# Patient Record
Sex: Female | Born: 1965 | Hispanic: No | Marital: Single | Smoking: Former smoker
Health system: Southern US, Community
[De-identification: ages and names within clinical notes are randomized; demographics above are authoritative.]

## PROBLEM LIST (undated history)

## (undated) DIAGNOSIS — M549 Dorsalgia, unspecified: Secondary | ICD-10-CM

## (undated) HISTORY — DX: Dorsalgia, unspecified: M54.9

---

## 2002-10-24 ENCOUNTER — Ambulatory Visit (HOSPITAL_COMMUNITY): Admission: RE | Admit: 2002-10-24 | Discharge: 2002-10-24 | Payer: Self-pay | Admitting: Internal Medicine

## 2002-10-24 ENCOUNTER — Encounter: Payer: Self-pay | Admitting: Internal Medicine

## 2002-12-15 ENCOUNTER — Ambulatory Visit (HOSPITAL_COMMUNITY): Admission: RE | Admit: 2002-12-15 | Discharge: 2002-12-15 | Payer: Self-pay | Admitting: Internal Medicine

## 2002-12-15 ENCOUNTER — Encounter: Payer: Self-pay | Admitting: Internal Medicine

## 2003-01-27 ENCOUNTER — Ambulatory Visit (HOSPITAL_COMMUNITY): Admission: RE | Admit: 2003-01-27 | Discharge: 2003-01-27 | Payer: Self-pay | Admitting: Internal Medicine

## 2003-01-27 ENCOUNTER — Encounter: Payer: Self-pay | Admitting: Internal Medicine

## 2003-12-24 ENCOUNTER — Ambulatory Visit (HOSPITAL_COMMUNITY): Admission: RE | Admit: 2003-12-24 | Discharge: 2003-12-24 | Payer: Self-pay | Admitting: Internal Medicine

## 2004-05-04 ENCOUNTER — Ambulatory Visit: Payer: Self-pay | Admitting: Internal Medicine

## 2004-05-06 ENCOUNTER — Ambulatory Visit: Payer: Self-pay | Admitting: *Deleted

## 2004-08-11 ENCOUNTER — Ambulatory Visit: Payer: Self-pay | Admitting: Internal Medicine

## 2004-10-14 ENCOUNTER — Ambulatory Visit: Payer: Self-pay | Admitting: Internal Medicine

## 2004-12-13 ENCOUNTER — Ambulatory Visit: Payer: Self-pay | Admitting: Internal Medicine

## 2004-12-13 ENCOUNTER — Encounter (INDEPENDENT_AMBULATORY_CARE_PROVIDER_SITE_OTHER): Payer: Self-pay | Admitting: Internal Medicine

## 2004-12-13 LAB — CONVERTED CEMR LAB: Pap Smear: NORMAL

## 2004-12-29 ENCOUNTER — Ambulatory Visit: Payer: Self-pay | Admitting: Internal Medicine

## 2004-12-30 ENCOUNTER — Ambulatory Visit: Payer: Self-pay | Admitting: Internal Medicine

## 2005-03-09 ENCOUNTER — Ambulatory Visit: Payer: Self-pay | Admitting: Internal Medicine

## 2005-03-23 ENCOUNTER — Ambulatory Visit: Payer: Self-pay | Admitting: Internal Medicine

## 2005-04-30 ENCOUNTER — Emergency Department (HOSPITAL_COMMUNITY): Admission: EM | Admit: 2005-04-30 | Discharge: 2005-04-30 | Payer: Self-pay | Admitting: Emergency Medicine

## 2005-05-08 ENCOUNTER — Ambulatory Visit: Payer: Self-pay | Admitting: Family Medicine

## 2005-07-10 ENCOUNTER — Ambulatory Visit: Payer: Self-pay | Admitting: Internal Medicine

## 2005-07-11 ENCOUNTER — Ambulatory Visit: Payer: Self-pay | Admitting: Internal Medicine

## 2005-09-21 ENCOUNTER — Ambulatory Visit: Payer: Self-pay | Admitting: Internal Medicine

## 2005-10-02 ENCOUNTER — Ambulatory Visit: Payer: Self-pay | Admitting: Internal Medicine

## 2005-10-16 ENCOUNTER — Ambulatory Visit: Payer: Self-pay | Admitting: Internal Medicine

## 2005-12-12 ENCOUNTER — Ambulatory Visit: Payer: Self-pay | Admitting: Internal Medicine

## 2005-12-22 ENCOUNTER — Inpatient Hospital Stay (HOSPITAL_COMMUNITY): Admission: AD | Admit: 2005-12-22 | Discharge: 2005-12-22 | Payer: Self-pay

## 2006-01-03 ENCOUNTER — Other Ambulatory Visit: Admission: RE | Admit: 2006-01-03 | Discharge: 2006-01-03 | Payer: Self-pay | Admitting: Obstetrics & Gynecology

## 2006-01-03 ENCOUNTER — Ambulatory Visit: Payer: Self-pay | Admitting: Obstetrics & Gynecology

## 2006-01-03 ENCOUNTER — Encounter (INDEPENDENT_AMBULATORY_CARE_PROVIDER_SITE_OTHER): Payer: Self-pay | Admitting: *Deleted

## 2006-01-17 ENCOUNTER — Ambulatory Visit: Payer: Self-pay | Admitting: Obstetrics & Gynecology

## 2006-01-17 ENCOUNTER — Encounter: Payer: Self-pay | Admitting: Obstetrics & Gynecology

## 2006-03-14 ENCOUNTER — Ambulatory Visit: Payer: Self-pay | Admitting: Obstetrics & Gynecology

## 2006-06-06 ENCOUNTER — Ambulatory Visit: Payer: Self-pay | Admitting: Obstetrics & Gynecology

## 2006-07-05 ENCOUNTER — Ambulatory Visit: Payer: Self-pay | Admitting: Obstetrics and Gynecology

## 2006-10-04 IMAGING — US US PELVIS COMPLETE MODIFY
1 series · 13 of 25 positions shown · non-contrast
Comparison: none

CLINICAL DATA: Heavy dysfunctional uterine bleeding.  Negative pregnancy test.
TRANSABDOMINAL AND TRANSVAGINAL PELVIC ULTRASOUND:
TECHNIQUE: Both transabdominal and transvaginal ultrasound examinations of the pelvis were performed including evaluation of the uterus, ovaries, adnexal regions, and pelvic cul-de-sac.

[Series 1: us pelvis complete modify · 0.27mm/px · 13 of 74 slices shown]
[im 1/74]
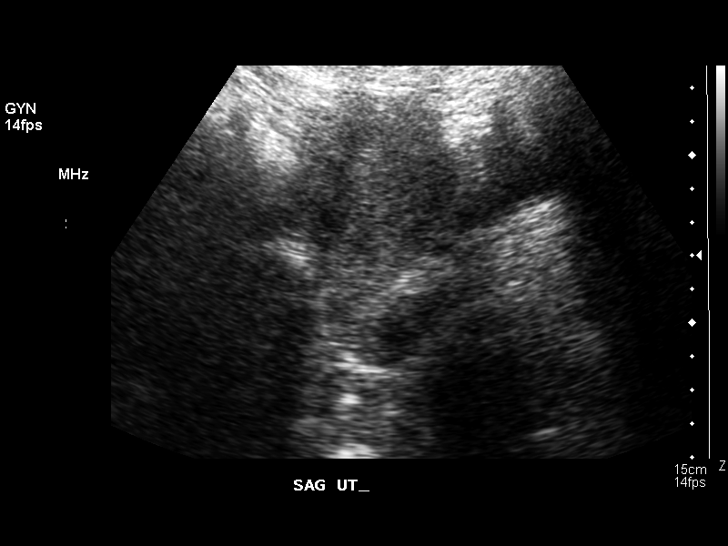
[im 7/74]
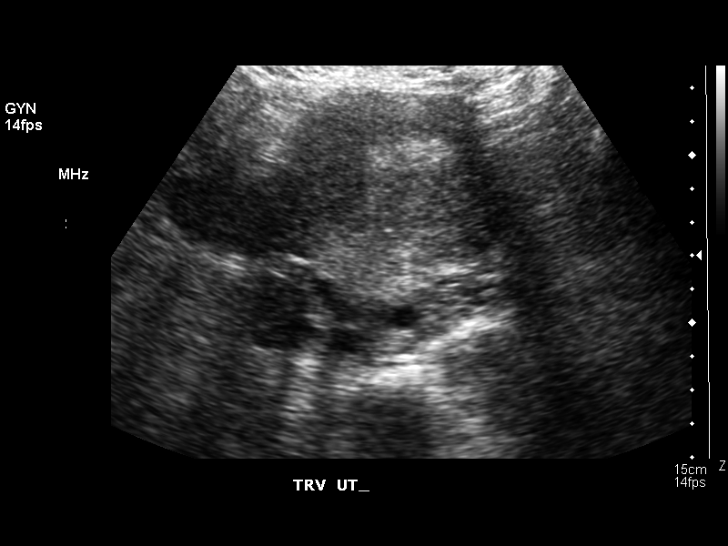
[im 13/74]
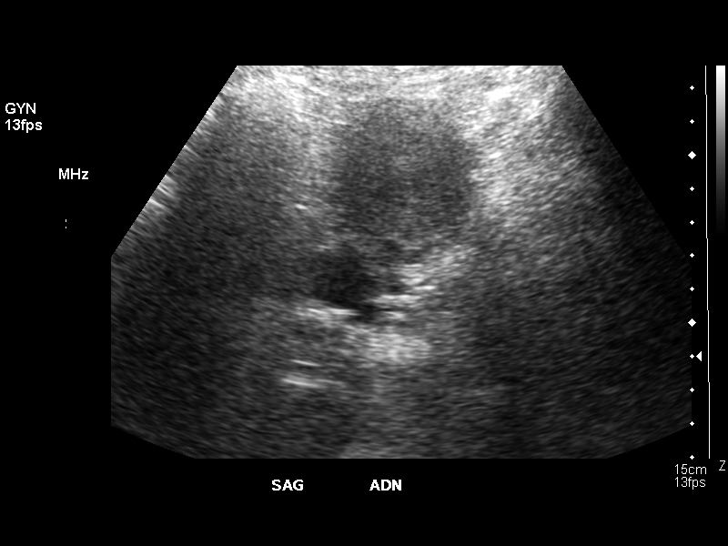
[im 19/74]
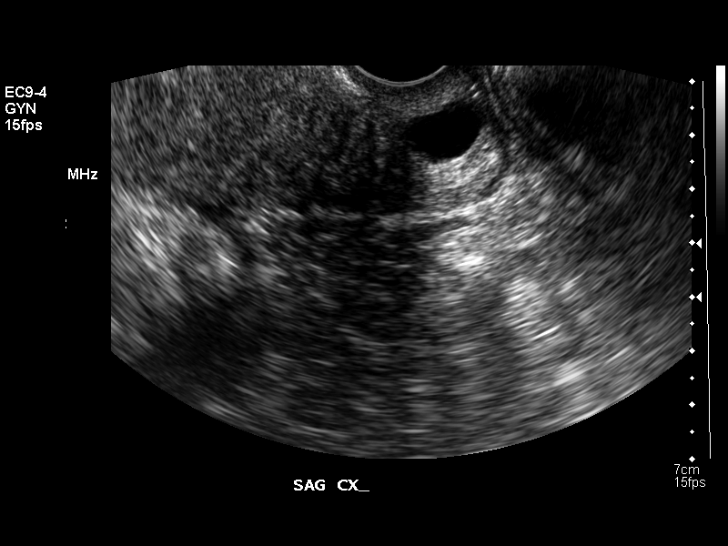
[im 25/74]
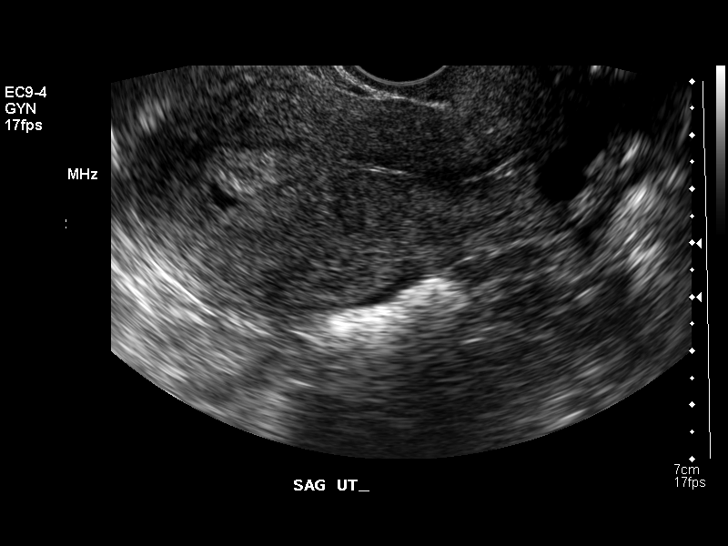
[im 31/74]
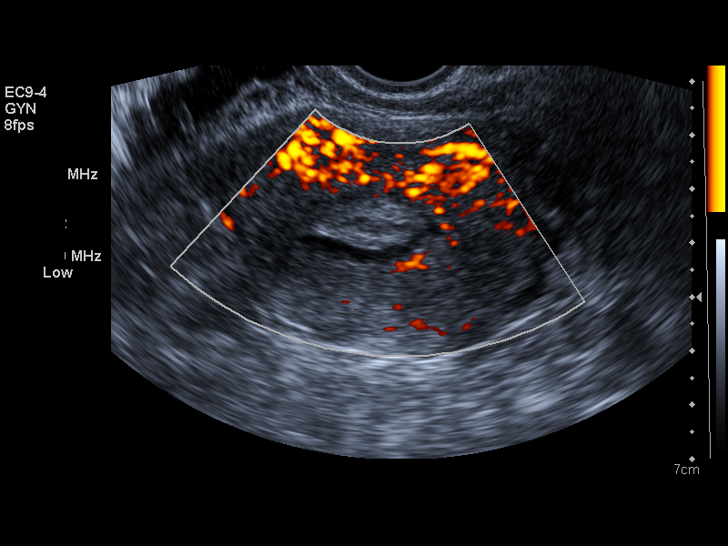
[im 37/74]
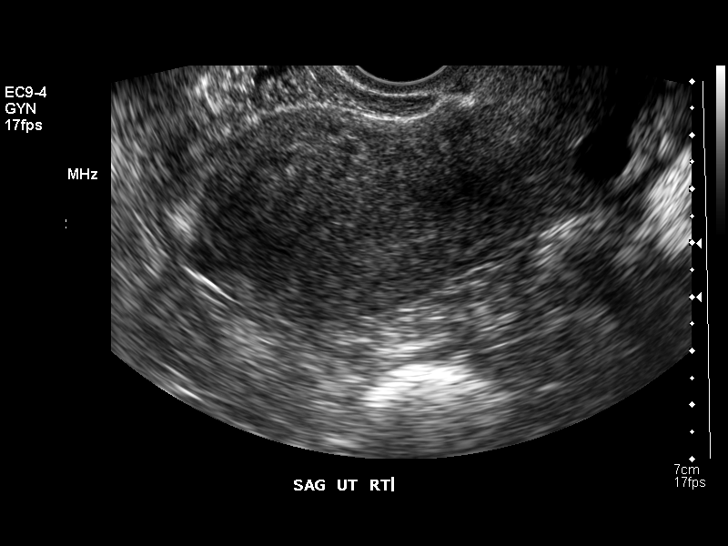
[im 43/74]
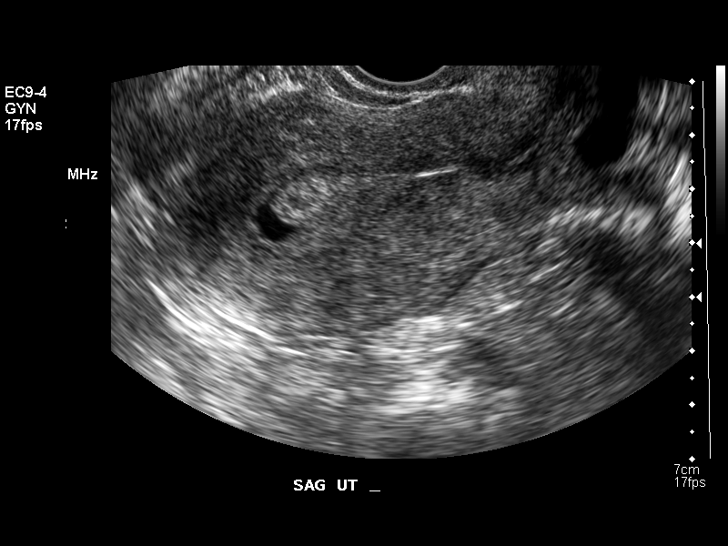
[im 49/74]
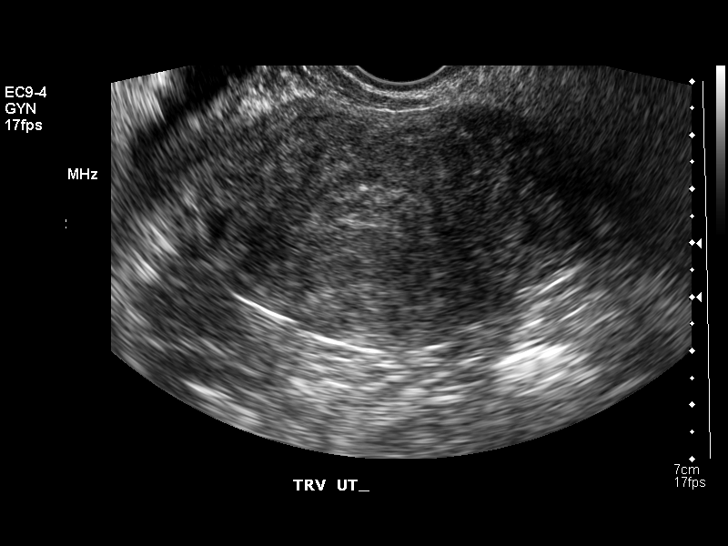
[im 55/74]
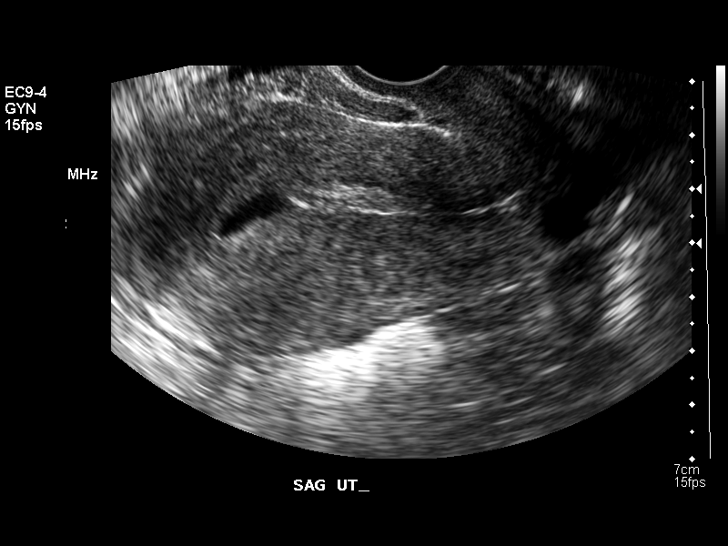
[im 61/74]
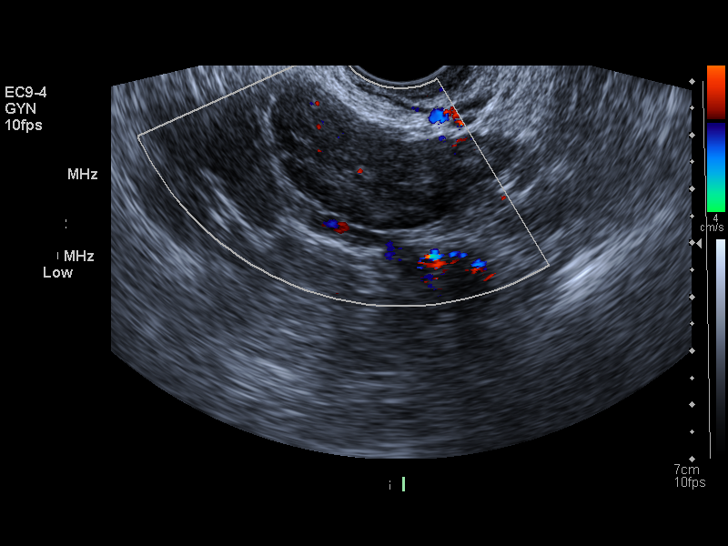
[im 67/74]
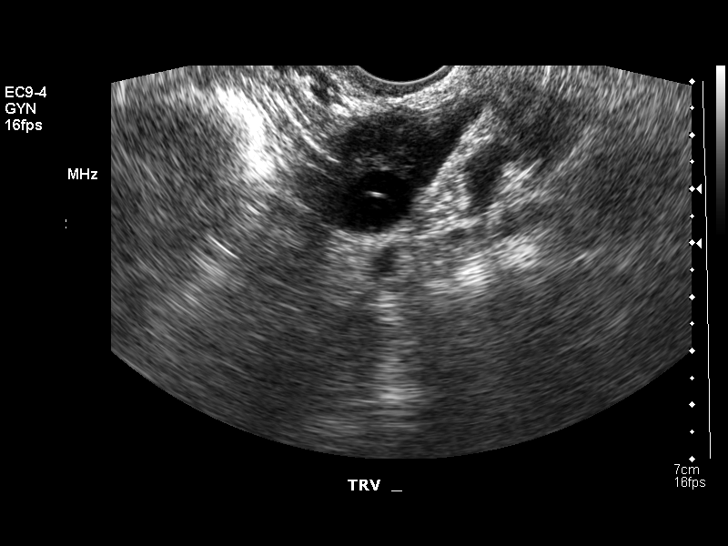
[im 74/74]
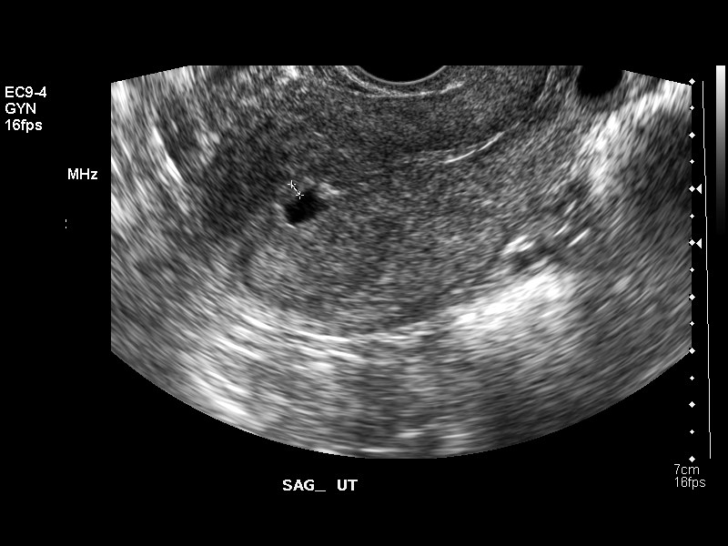

[13 of 25 positions shown; findings below may reference images not displayed]

FINDINGS: The uterus is normal in size measuring approximately 8.5 cm in length.  No fibroids or other uterine masses are identified.  Several cervical nabothian cysts are noted.  
There is a tiny amount of fluid in the fundal portion of the endometrial cavity.  There is also a focal area of thickening of the endometrium noted in the upper uterine body, with this area measuring approximately 7 mm in thickness.  No internal blood flow is seen within this area of endometrial thickening however, the possibility of an endometrial polyp cannot be excluded.  
Both ovaries are visualized and are normal in appearance.  No adnexal masses or free fluid are identified by transabdominal or transvaginal sonography.
IMPRESSION: 1.  No evidence of fibroids.
2.  Small amount of fluid noted within the endometrial cavity and a focal area of endometrial thickening in the upper uterine body measuring approximately 7 mm.  Endometrial pathology such as a polyp cannot be excluded, and further evaluation with sonohysterogram could be performed if clinically warranted.  
3.  Normal ovaries.  No evidence of adnexal mass or free fluid.

## 2007-01-28 ENCOUNTER — Encounter (INDEPENDENT_AMBULATORY_CARE_PROVIDER_SITE_OTHER): Payer: Self-pay | Admitting: Internal Medicine

## 2007-01-28 DIAGNOSIS — N898 Other specified noninflammatory disorders of vagina: Secondary | ICD-10-CM | POA: Insufficient documentation

## 2007-01-28 DIAGNOSIS — E119 Type 2 diabetes mellitus without complications: Secondary | ICD-10-CM

## 2007-05-15 ENCOUNTER — Ambulatory Visit: Payer: Self-pay | Admitting: Obstetrics & Gynecology

## 2007-05-15 ENCOUNTER — Encounter: Payer: Self-pay | Admitting: Obstetrics & Gynecology

## 2008-04-29 ENCOUNTER — Encounter: Payer: Self-pay | Admitting: Obstetrics and Gynecology

## 2008-04-29 ENCOUNTER — Ambulatory Visit: Payer: Self-pay | Admitting: Obstetrics and Gynecology

## 2009-06-09 ENCOUNTER — Ambulatory Visit: Payer: Self-pay | Admitting: Obstetrics and Gynecology

## 2009-06-09 ENCOUNTER — Encounter: Payer: Self-pay | Admitting: Obstetrics and Gynecology

## 2011-01-03 NOTE — Group Therapy Note (Signed)
NAME:  ROZALYN, OSLAND                 ACCOUNT NO.:  [id number]   MEDICAL RECORD NO.:  [id number]          PATIENT TYPE:  WOC   LOCATION[facility]                   FACILITY:  [facility]   PHYSICIAN:  Argentina Donovan, MD        DATE OF BIRTH:  1966-07-05   DATE OF SERVICE:                                  CLINIC NOTE   The patient is a 45 year old Caucasian female, gravida 3, para 3-0-0-3,  in for an annual visit.  She has no significant complaints except for  her weight, and she did come down from over almost 300 pounds to 238 on  her own, but that has been plateauing for the past year.  We discussed  that and the possibility of getting her into some good program.  The  impression that I have in talking with her is that she has no other  complaints.  We have offered her a mammogram.  There is a scholarship  fund, and she does not want that now, but she said she would call back  if she decided she did want it.   PHYSICAL EXAMINATION:  BREASTS:  On examination, her breasts are  pendulous and with no dominant masses.  No nipple discharge.  ABDOMEN:  Soft, flat and nontender.  No masses or organomegaly.  GENITALIA:  External was normal.  Introitus was marital.  BUS within  normal limits.  Vagina was clean and well rugated.  Cervix was clean and  parous.  Uterus anterior, normal size, shape, consistency.  The adnexa  could not be outlined because of the habitus of the patient.  Rectal  exam was confirmatory with no mass noted.   IMPRESSION:  Normal gynecological examination.  Pap smear was taken and  wet prep.           ______________________________  Argentina Donovan, MD     PR/MEDQ  D:  04/29/2008  T:  04/29/2008  Job:  [id number]

## 2011-01-03 NOTE — Group Therapy Note (Signed)
NAME:  Pamela Shannon, Pamela Shannon                 ACCOUNT NO.:  [id number]   MEDICAL RECORD NO.:  [id number]          PATIENT TYPE:  WOC   [facility]ATION[facility]                   FACILITY:  [facility]   PHYSICIAN:  Johnella Moloney, MD        DATE OF BIRTH:  19-Oct-1965   DATE OF SERVICE:                                  CLINIC NOTE   CHIEF COMPLAINT:  Yearly exam, vulva pruritus.   HISTORY OF PRESENT ILLNESS:  The patient is a 45 year old G3 P3. Her  last menstrual period was April 24, 2007. She is here for her yearly  exam. The patient also reports having itching and burning on her vulva  area accompanied by vaginal discharge with occasional bad odor, and this  has been going on for two and a half weeks. She reports using Monistat  which did not alleviate her symptoms. She denies any fevers, chills,  sweats, or any other symptoms. The patient is currently sexually active  with one female partner and has been in a relationship with him for the  last four months. There are no other issues. The patient had a Mirena  IUD that was placed in October 2007 for dysfunctional uterine bleeding  and reports being very satisfied with that method.   PAST OB-GYN HISTORY:  G3 P3, spontaneous vaginal delivery x3. The  patient with dysfunctional uterine bleeding prior to Mirena placement.  After Mirena placement, the patient reports having episodes of bleeding  every 1 to 2 months which last 3 to 5 days and she uses panty liners or  a few pads. She is currently sexually active. No condoms.   PAST MEDICAL HISTORY:  None.   PAST SURGICAL HISTORY:  None.   MEDICATIONS:  1. Green tea.  2. Multivitamins.  3. Fish oil caplets.   ALLERGIES:  No known drug allergies.   SOCIAL HISTORY:  The patient works as a Forensic psychologist. She lives with  children. She denies any domestic abuse. She denies any tobacco,  alcohol, or illicit drug use.   FAMILY HISTORY:  Maternal aunt with a diagnosis of breast cancer in her  late 37s.  She denies any ovarian, uterine, or any other cancer.   REVIEW OF SYSTEMS:  A 14 point review of systems is reviewed which was  negative.   PHYSICAL EXAMINATION:  VITAL SIGNS:  Temperature 98.9, pulse 97, blood  pressure 120/76, weight 230 pounds, height 5 feet 9 inches.  GENERAL:  No apparent distress.  BREASTS:  Bilaterally symmetrical, nontender, no abnormal masses  palpated, no abnormal skin changes.  ABDOMEN:  Soft, obese, nontender, nondistended.  PELVIC:  Diffuse erythema and some excoriations noted on bilateral labia  minora, normal labia majora, no abnormal discharge or other lesions  noted externally. On speculum examination, well rugate vagina, but not  lesions. Small amount of clear discharge noted. No odor. Samples  obtained for wet prep. Cervix is parous. No abnormal discharge. IUD  strings recognized about 2 centimeters in length from external os. Pap  smear sample obtained. By manual exam, the patient has normal sized  retroverted uterus, nontender, no abnormal adnexal  masses.   ASSESSMENT AND PLAN:  The patient is a 45 year old G3 P3 here for her  annual exam. The patient also reports a history of vulva irritation.  Given her symptoms, the patient was given a prescription of  hydrocortisone 1% cream to be used and applied topically to the area  twice daily as needed for irritation and in the meantime will follow up  wet prep results and treat accordingly. We will also follow up pap smear  results. The pap smear sample was also sent for testing for gonorrhea  and chlamydia. I emphasized the importance of using condoms for sexually  transmitted infection and prevention. The patient was also counseled  regarding having a mammogram given her age and family history, but the  patient is unwilling to be scheduled for a mammogram at this point due  to financial reasons. She was informed of a program that gives aid to  women who do not have insurance in order for them to get a  mammogram,  but the patient is not currently interested in this. She was told that  it was the standard of care to start mammograms in women who are aged 20  or above, especially given the history of breast cancer. The patient  verbalized understanding of all of this and said that she,  is not  ready for a mammogram right now. We will follow up pap smear results,  gonorrhea/chlamydia results, wet prep results, and the patient will be  contacted with the results of these tests.           ______________________________  Johnella Moloney, MD     UD/MEDQ  D:  05/15/2007  T:  05/16/2007  Job:  [id number]

## 2011-01-06 NOTE — Group Therapy Note (Signed)
NAME:  Pamela Shannon, Pamela Shannon                 ACCOUNT NO.:  [id number]   MEDICAL RECORD NO.:  [id number]          PATIENT TYPE:  WOC   [facility]ATION[facility]                   [facility]ACILITY:  [facility]   PHYSICIAN:  Elsie Lincoln, MD      DATE OF BIRTH:  [birth year]   DATE OF SERVICE:  01/03/2006                                    CLINIC NOTE   Patient is a 45 year old female who was sent to me from [facility] for  chronic bleeding.  She was also seen at the [facility] for the same problem.  She  has had six months of bleeding every day.  She has been on Depo Provera and  has still had bleeding every day.  On May 4 she came to the [facility], was found  to have a hemoglobin of 10.  Pelvic ultrasound showed a small amount of free  fluid in endometrial canal and also a thickening in the upper uterine body  approximately 7 mm.  She did have GC/Chlamydia done at that time.  Her EPT  was negative.  She was started on Ovcon three tablets in three days, two  days for three days, then daily.  Patient is to not take the placebo pills  of this pack.  Today the patient states she is just spotting.  Up until this  past six months she has had only normal periods every month and no other  menstrual irregularities that she can comment on.  No intramenstrual  spotting.   PAST MEDICAL HISTORY:  Denies all medical problems.   PAST SURGICAL HISTORY:  Denies all surgeries.   PAST GYN HISTORY:  NSVD x3 and a history of a abnormal Pap smear in 2003  that required a biopsy.  This was done at [facility] in [facility].  She  denies ovarian cysts or fibroid tumors.   REVIEW OF SYMPTOMS:  Today fatigue, weight loss, dizzy spells, problems with  vision, nausea, vomiting, and vaginal bleeding.   FAMILY HISTORY:  Positive for mother, father and diabetes on herself.  Heart  attack with her grandfather.  High blood pressure in her mother and  grandmother.  Her aunt had a cancer but she does not elucidate which kind.   SOCIAL HISTORY:  She  does not smoke or drink alcohol or do drugs.  Has  approximately one caffeinated beverage a day.   MEDICATIONS:  Vitamin B12, vitamin E, vitamin C, calcium, green tea, Depo  Provera, OCPs, and was in iron until she became constipated.  She is going  to restart the iron and docusate sodium after she takes a laxative this  weekend.   ALLERGIES:  No food or drug allergies.   Last Pap smear was at least a year ago and she is in need of another one.  Last mammogram is none.   PHYSICAL EXAMINATION:  ABDOMEN:  Soft, nontender, nondistended.  No rebound.  No guarding.  GENITALIA:  Tanner V.  Vulva:  No lesions.  Vagina:  Some blood.  Uterus:  Grade 1-2 prolapse.  Sounds to 8 cm.  Mobile.  Adnexa:  No masses,  nontender.  Perineum:  Positive hemorrhoids.   ASSESSMENT/PLAN:  A 45 year old female with abnormal uterine bleeding.  1.  EPT negative and endometrial biopsy done with three passes.  Lots of old      blood in the [place].  2.  Docusate sodium and iron to continue.  3.  Consider TSH next visit.  4.  Need to rule out polyp or fibroid.  This can either be done with saline      sono or go just directly to [facility] hysteroscopy and possible ablation.  5.  Patient is a diabetic.  Now that I have looked at some of her paperwork      we need to elucidate more of this at her next history.  6.  Pap smear needs to be done.  7.  Mammogram needs to be done.           ______________________________  Elsie Lincoln, MD     KL/MEDQ  D:  01/03/2006  T:  01/04/2006  Job:  [id number]

## 2011-01-06 NOTE — Group Therapy Note (Signed)
NAME:  Pamela Shannon, Pamela Shannon                 ACCOUNT NO.:  [id number]   MEDICAL RECORD NO.:  [id number]          PATIENT TYPE:  WOC   [facility]                   [facility]ACILITY[facility]   PHYSICIAN:  Argentina Donovan, MD        DATE OF BIRTH:  06/08/1966   DATE OF SERVICE:  07/05/2006                                    CLINIC NOTE   REASON FOR VISIT:  The patient is a 45 year old, African-American female who  had the [place] IUD placed 1 month ago to control irregular bleeding that had  very recently been controlled by birth control pills.  She had a negative  endometrial biopsy prior to that and her chief complaint is that she is  still spotting.  I told her to try and have patience and that probably the  bleeding would stop in a short time.  She says that interferes with her  ability to have a relationship and so I have added Loestrin FE for two  cycles and hopefully the Jearld Adjutant will be effective by that time.  She was  also treated for a urinary tract infection when she was last in and will  follow that up with her repeat urine.   IMPRESSION:  Dysfunctional uterine bleeding.           ______________________________  Argentina Donovan, MD     PR/MEDQ  D:  07/05/2006  T:  07/05/2006  Job:  [id number]

## 2011-01-06 NOTE — Group Therapy Note (Signed)
NAME:  Pamela Shannon, Pamela Shannon                 ACCOUNT NO.:  [id number]   MEDICAL RECORD NO.:  [id number]          PATIENT TYPE:  WOC   [facility]                   [facility]   PHYSICIAN:  Elsie Lincoln, MD      DATE OF BIRTH:  06-13-1966   DATE OF SERVICE:  01/17/2006                                    CLINIC NOTE   The patient presents for results.  The patient has DUB for 6 months.  She  also needed a Pap smear.  The patient had negative endometrial biopsy.  It  was predominantly blood; however, she did have some rare fragments of  endometrial tissue consistent with exogenous progesterone therapy.  Endometrial cancer is very unlikely. The patient is on Loestrin Fe 24 and is  doing very well, is doing much better on this than on the Depot.  She wants  continue this and eventually get the [facility].  We did Pap smear cultures  today in order to facilitate the placement of the [facility].  She is applying  for a free Jearld Adjutant to the [employer].  We are also going to check her  thyroid today and make sure this is in normal limits.  The patient is also  encouraged to get a mammogram which she refuses at this time.  We will  readdress this at a later visit.  The patient is to come back in 2 months  for [facility] IUD insertion.           ______________________________  Elsie Lincoln, MD     KL/MEDQ  D:  01/17/2006  T:  01/17/2006  Job:  [id number]

## 2011-01-06 NOTE — Group Therapy Note (Signed)
NAME:  Pamela Shannon, Pamela Shannon                 ACCOUNT NO.:  [id number]   MEDICAL RECORD NO.:  [id number]          PATIENT TYPE:  WOC   [facility]ATION[facility]                   [facility]   PHYSICIAN:  Dorthula Perfect, MD     DATE OF BIRTH:  04/19/1966   DATE OF SERVICE:  06/06/2006                                    CLINIC NOTE   [age]-year-old female, para 3, with a history of dysfunctional uterine  bleeding for some time now, is here for a Mirena IUD.  She has had a  negative endometrial biopsy that was done May 2007.  She has a negative  pregnancy test today.   The procedure was explained to the patient.  Consent forms are signed.   PHYSICAL EXAMINATION:  ABDOMEN:  Somewhat obese, soft and nontender.  PELVIC EXAMINATION:  External genitalia and BUS glands are normal.  The  vaginal wall is epithelialized, as was the cervix.  The uterus is in the  midline, and is of normal size and shape to perhaps slightly minimally  enlarged.  Adnexal areas are normal.   IMPRESSION:  History of symptomatic menorrhagia.   DISPOSITION:  1. Mirena IUD was inserted.  The endocervical-endometrial cavity was      sounded a little bit more than 10 cm, the intrauterine device was      inserted without difficulty.  The string was trimmed to about 1.5      inches.  The patient will return in 4 weeks to see how things are going      and to check the string.  If all is okay, then she can be seen      annually.           ______________________________  Dorthula Perfect, MD     ER/MEDQ  D:  06/06/2006  T:  06/08/2006  Job:  [id number]

## 2011-07-07 ENCOUNTER — Ambulatory Visit (INDEPENDENT_AMBULATORY_CARE_PROVIDER_SITE_OTHER): Payer: Self-pay | Admitting: Advanced Practice Midwife

## 2011-07-07 ENCOUNTER — Encounter: Payer: Self-pay | Admitting: *Deleted

## 2011-07-07 VITALS — BP 125/82 | HR 110 | Temp 97.8°F | Ht 68.0 in | Wt 224.1 lb

## 2011-07-07 DIAGNOSIS — Z30433 Encounter for removal and reinsertion of intrauterine contraceptive device: Secondary | ICD-10-CM

## 2011-07-07 DIAGNOSIS — Z30430 Encounter for insertion of intrauterine contraceptive device: Secondary | ICD-10-CM

## 2011-07-07 DIAGNOSIS — Z0000 Encounter for general adult medical examination without abnormal findings: Secondary | ICD-10-CM

## 2011-07-07 DIAGNOSIS — N390 Urinary tract infection, site not specified: Secondary | ICD-10-CM

## 2011-07-07 DIAGNOSIS — Z01419 Encounter for gynecological examination (general) (routine) without abnormal findings: Secondary | ICD-10-CM

## 2011-07-07 DIAGNOSIS — N3090 Cystitis, unspecified without hematuria: Secondary | ICD-10-CM

## 2011-07-07 DIAGNOSIS — Z0001 Encounter for general adult medical examination with abnormal findings: Secondary | ICD-10-CM | POA: Insufficient documentation

## 2011-07-07 LAB — POCT URINALYSIS DIP (DEVICE)
Glucose, UA: 250 mg/dL — AB
Ketones, ur: NEGATIVE mg/dL
Protein, ur: NEGATIVE mg/dL
Specific Gravity, Urine: 1.025 (ref 1.005–1.030)
Urobilinogen, UA: 0.2 mg/dL (ref 0.0–1.0)

## 2011-07-07 LAB — POCT PREGNANCY, URINE: Preg Test, Ur: NEGATIVE

## 2011-07-07 MED ORDER — SULFAMETHOXAZOLE-TRIMETHOPRIM 800-160 MG PO TABS: 1.0000 | ORAL_TABLET | Freq: Two times a day (BID) | ORAL | Status: AC

## 2011-07-07 NOTE — Progress Notes (Signed)
IUD Insertion Procedure Note  Pre-operative Diagnosis: Desires IUD replacement (has Mirena IUD, Wants new one)  Post-operative Diagnosis: same  Indications: contraception  Procedure Details  Urine pregnancy test was done  and result was Negative.  The risks (including infection, bleeding, pain, and uterine perforation) and benefits of the procedure were explained to the patient and Written informed consent was obtained.    Time Out was performed.  Cervix cleansed with Betadine. Uterus sounded to 6 cm. IUD inserted without difficulty. String visible and trimmed. Patient tolerated procedure well.  IUD Information: Mirena. Lot # [id number] [id number] 05/15  Condition: Stable  Complications: None  Plan:  The patient was advised to call for any fever or for prolonged or severe pain or bleeding. She was advised to use NSAID as needed for mild to moderate pain.   Attending Physician Documentation: I was present for or participated in the entire procedure, including opening and closing. (Dr Macon Large was in clinic, but I performed procedure)   Additional Diagnosis:  Cystitis (positive Nitrites)  Will treat with Septra DS for 3 days  Pap smear also done for annual exam.

## 2011-07-07 NOTE — Patient Instructions (Signed)

## 2011-07-08 LAB — WET PREP, GENITAL
Trich, Wet Prep: NONE SEEN
WBC, Wet Prep HPF POC: NONE SEEN

## 2013-02-04 ENCOUNTER — Other Ambulatory Visit (HOSPITAL_COMMUNITY): Payer: Self-pay | Admitting: Internal Medicine

## 2013-02-04 DIAGNOSIS — R0602 Shortness of breath: Secondary | ICD-10-CM

## 2013-02-11 ENCOUNTER — Encounter (HOSPITAL_COMMUNITY): Payer: Self-pay

## 2014-03-29 ENCOUNTER — Ambulatory Visit (INDEPENDENT_AMBULATORY_CARE_PROVIDER_SITE_OTHER): Payer: BC Managed Care – PPO | Admitting: Emergency Medicine

## 2014-03-29 VITALS — BP 138/84 | HR 96 | Temp 98.1°F | Resp 18 | Ht 68.0 in | Wt 238.4 lb

## 2014-03-29 DIAGNOSIS — M7501 Adhesive capsulitis of right shoulder: Secondary | ICD-10-CM

## 2014-03-29 DIAGNOSIS — M7500 Adhesive capsulitis of unspecified shoulder: Secondary | ICD-10-CM

## 2014-03-29 MED ORDER — NAPROXEN SODIUM 550 MG PO TABS: 550.0000 mg | ORAL_TABLET | Freq: Two times a day (BID) | ORAL | Status: AC

## 2014-03-29 NOTE — Progress Notes (Signed)
[facility] [address], [place] [address] [phone]  Date:  03/29/2014   Name:  Pamela Shannon   DOB:  10/23/1965   MRN:  [id number]  PCP:  No PCP Per Patient    Chief Complaint: Shoulder Pain   History of Present Illness:  Pamela Shannon is a 48 y.o. 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  Has pain in right shoulder with no history of injury or overuse.   Awoke with pain and unable to use arm.  No neuro symptoms.   No improvement with over the counter medications or other home remedies.  Denies other complaint or health concern today.   Patient Active Problem List   Diagnosis Date Noted  . Encounter for IUD insertion 07/07/2011  . DM, UNCOMPLICATED, TYPE II 01/28/2007  . VAGINAL BLEEDING 01/28/2007    Past Medical History  Diagnosis Date  . Diabetes mellitus   . Back pain     History reviewed. No pertinent past surgical history.  History  Substance Use Topics  . Smoking status: Former Games developermoker  . Smokeless tobacco: Never Used  . Alcohol Use: Yes    Family History  Problem Relation Age of Onset  . Diabetes Father   . Diabetes Mother   . Fibromyalgia Mother     No Known Allergies  Medication list has been reviewed and updated.  Current Outpatient Prescriptions on File Prior to Visit  Medication Sig Dispense Refill  . levonorgestrel (MIRENA) 20 MCG/24HR IUD 1 each by Intrauterine route once.        . metFORMIN (GLUCOPHAGE) 500 MG tablet Take 1,000 mg by mouth once.       . Naproxen Sodium (ALEVE) 220 MG CAPS Take 1 capsule by mouth as needed.         No current facility-administered medications on file prior to visit.    Review of Systems:   As per HPI, otherwise negative.    Physical Examination: Filed Vitals:   03/29/14 1417  BP: 138/84  Pulse: 96  Temp: 98.1 F (36.7 C)  Resp: 18   Filed Vitals:   03/29/14 1417  Height: 5\' 8"  (1.727 m)  Weight: 238 lb  6.4 oz (108.138 kg)   Body mass index is 36.26 kg/(m^2). Ideal Body Weight: Weight in (lb) to have BMI = 25: 164.1   GEN: WDWN, NAD, Non-toxic, Alert & Oriented x 3 HEENT: Atraumatic, Normocephalic.  Ears and Nose: No external deformity. EXTR: No clubbing/cyanosis/edema NEURO: Normal gait.  PSYCH: Normally interactive. Conversant. Not depressed or anxious appearing.  Calm demeanor.  Right shoulder:  Tender anterior shoulder.  Limited elevation and abduction.  Cannot bring above plane of floor.  Assessment and Plan: Adhesive capsulitis Depo 40 and marcaine Anaprox Ortho  Signed,  Phillips OdorJeffery Anderson, MD

## 2014-03-29 NOTE — Patient Instructions (Signed)
Adhesive Capsulitis  Sometimes the shoulder becomes stiff and is painful to move. Some people say it feels as if the shoulder is frozen in place. Because of this, the condition is called "frozen shoulder." Its medical name is adhesive capsulitis.   The shoulder joint is made up of strong connective tissue that attaches the ball of the humerus to the shallow shoulder socket. This strong connective tissue is called the joint capsule. This tissue can become stiff and swollen. That is when adhesive capsulitis sets in.  CAUSES   It is not always clear just what the cause adhesive capsulitis. Possibilities include:  · Injury to the shoulder joint.  · Strain. This is a repetitive injury brought about by overuse.  · Lack of use. Perhaps your arm or hand was otherwise injured. It might have been in a sling for awhile. Or perhaps you were not using it to avoid pain.  · Referred pain. This is a sort of trick the body plays. You feel pain in the shoulder. But, the pain actually comes from an injury somewhere else in the body.  · Long-standing health problems. Several diseases can cause adhesive capsulitis. They include diabetes, heart disease, stroke, thyroid problems, rheumatoid arthritis and lung disease.  · Being a women older than 40. Anyone can develop adhesive capsulitis but it is most common in women in this age group.  SYMPTOMS   · Pain.  ¨ It occurs when the arm is moved.  ¨ Parts of the shoulder might hurt if they are touched.  ¨ Pain is worse at night or when resting.  · Soreness. It might not be strong enough to be called pain. But, the shoulder aches.  · The shoulder does not move freely.  · Muscle spasms.  · Trouble sleeping because of shoulder ache or pain.  DIAGNOSIS   To decide if you have adhesive capsulitis, your healthcare provider will probably:  · Ask about symptoms you have noticed.  · Ask about your history of joint pain and anything that might have caused the pain.  · Ask about your overall  health.  · Use hands to feel your shoulder and neck.  · Ask you to move your shoulder in specific directions. This may indicate the origin of the pain.  · Order imaging tests; pictures of the shoulder. They help pinpoint the source of the problem. An X-ray might be used. For more detail, an MRI is often used. An MRI details the tendons, muscles and ligaments as well as the joint.  TREATMENT   Adhesive capsulitis can be treated several ways. Most treatments can be done in a clinic or in your healthcare provider's office. Be sure to discuss the different options with your caregiver. They include:  · Physical therapy. You will work on specific exercises to get your shoulder moving again. The exercises usually involve stretching. A physical therapist (a caregiver with special training) can show you what to do and what not to do. The exercises will need to be done daily.  · Medication.  ¨ Over-the-counter medicines may relieve pain and inflammation (the body's way of reacting to injury or infection).  ¨ Corticosteroids. These are stronger drugs to reduce pain and inflammation. They are given by injection (shots) into the shoulder joint. Frequent treatment is not recommended.  ¨ Muscle relaxants. Medication may be prescribed to ease muscle spasms.  · Treatment of underlying conditions. This means treating another condition that is causing your shoulder problem. This might be a rotator   cuff (tendon) problem  · Shoulder manipulation. The shoulder will be moved by your healthcare provider. You would be under general anesthesia (given a drug that puts you to sleep). You would not feel anything. Sometimes the joint will be injected with salt water (saline) at high pressure to break down internal scarring in the joint capsule.  · Surgery. This is rarely needed. It may be suggested in advanced cases after all other treatment has failed.  PROGNOSIS   In time, most people recover from adhesive capsulitis. Sometimes, however, the  pain goes away but full movement of the shoulder does not return.   HOME CARE INSTRUCTIONS   · Take any pain medications recommended by your healthcare provider. Follow the directions carefully.  · If you have physical therapy, follow through with the therapist's suggestions. Be sure you understand the exercises you will be doing. You should understand:  ¨ How often the exercises should be done.  ¨ How many times each exercise should be repeated.  ¨ How long they should be done.  ¨ What other activities you should do, or not do.  ¨ That you should warm up before doing any exercise. Just 5 to 10 minutes will help. Small, gentle movements should get your shoulder ready for more.  · Avoid high-demand exercise that involves your shoulder such as throwing. This type of exercise can make pain worse.  · Consider using cold packs. Cold may ease swelling and pain. Ask your healthcare provider if a cold pack might help you. If so, get directions on how and when to use them.  SEEK MEDICAL CARE IF:   · You have any questions about your medications.  · Your pain continues to increase.  Document Released: 06/04/2009 Document Revised: 10/30/2011 Document Reviewed: 06/04/2009  [employer]® Patient Information ©2015 [employer]. This information is not intended to replace advice given to you by your health care provider. Make sure you discuss any questions you have with your health care provider.

## 2014-10-05 ENCOUNTER — Encounter: Payer: Self-pay | Admitting: Endocrinology

## 2014-10-05 ENCOUNTER — Ambulatory Visit (INDEPENDENT_AMBULATORY_CARE_PROVIDER_SITE_OTHER): Payer: BLUE CROSS/BLUE SHIELD | Admitting: Endocrinology

## 2014-10-05 VITALS — BP 134/84 | HR 102 | Temp 97.8°F | Ht 68.0 in | Wt 238.0 lb

## 2014-10-05 DIAGNOSIS — IMO0002 Reserved for concepts with insufficient information to code with codable children: Secondary | ICD-10-CM

## 2014-10-05 DIAGNOSIS — E781 Pure hyperglyceridemia: Secondary | ICD-10-CM

## 2014-10-05 DIAGNOSIS — E1165 Type 2 diabetes mellitus with hyperglycemia: Secondary | ICD-10-CM

## 2014-10-05 LAB — GLUCOSE, POCT (MANUAL RESULT ENTRY): POC GLUCOSE: 255 mg/dL — AB (ref 70–99)

## 2014-10-05 MED ORDER — LIRAGLUTIDE 18 MG/3ML ~~LOC~~ SOPN: PEN_INJECTOR | SUBCUTANEOUS | Status: DC

## 2014-10-05 NOTE — Patient Instructions (Addendum)
Please check blood sugars at least half the time about 2 hours after any meal and 3 times per week on waking up. Please bring blood sugar monitor to each visit. Recommended blood sugar levels about 2 hours after meal is 140-180 and on waking up 90-130  Victoza 1.8 mg daily  Levemir 35 units daily to keep am sugar <130  No drinks with sugar    INDOOR EXERCISE IDEAS   Here's an example of a creative, compact workout (perform each move for 2-3 minutes): [name]. Warm up. Put on some music that makes you feel like moving, and dance around the living room. . Watch exercise shows on TV and move along with them. You don't have to invest in a lot of exercise videos if your budget is strapped. There are tons of free cable channels that have daily exercise shows on them for all levels - beginner through advanced. . Walk up and down the steps. . Do dumbbell curls and presses (if you don't have weights, use full water bottles). . Do assisted squats, keeping your back on a fitness ball against the wall or using the back of the couch for support. . Shadow box: Lift and lower the left leg; jab with the right arm, then the left; then lift and lower the right leg. [name]. Fence (you don't even need swords). Pretend you're holding a sword in each hand. Create an X pattern standing still, then moving forward and back. . Hop on your exercise bike or treadmill -- or, for something different, use a weighted hula hoop. If you don't have any of those, just go back to dancing. . Do abdominal crunches (hold a weighted ball for added resistance). [name]. Cool down with [employer] Brown's "I Feel Good" -- or whatever tune makes you feel good

## 2014-10-05 NOTE — Progress Notes (Signed)
Patient ID: Pamela Shannon, female   DOB: Jun 12, 1966, 49 y.o.   MRN: [id number]           Reason for Appointment: Consultation for Type 2 Diabetes  Referring physician: Mellody Drown  History of Present Illness:          Diagnosis: Type 2 diabetes mellitus, date of diagnosis: ?  2011       Past history:  At diagnosis she was having symptoms of marked fatigue, dry mouth but not clear how high her sugars were. She has been on various medications for treating her diabetes, probably metformin to start with. Also around the time of diagnosis she thinks she weighed about 300 pounds. She thinks she was able to lose weight subsequently and about 2 years ago had lost down to nearly 200 pounds All the details are not available of her previous management but she appears to have had progressively worsening requiring multiple agents and additional therapies. She thinks her A1c has been over 9% for about 2 years or more, lab records are not available She probably had been on metformin along with Tanzeum about a year or so ago. Because of inadequate control she was also given Invokamet at some point.  Recent history: In 9/15 she was switched from Tanzeum to [place] which she has increased to 1.2 mg daily. Also at that time she was started on Levemir insulin at night She was tried on Jardiance instead of metformin but subsequently back on Invokamet She thinks she was taking only a dose of 50/1000 once a day of the Invokamet Her insulin dose has not been changed for sometime and still takes 20 units at night. Until recently she thinks she had been going off her diet significantly eating large portions and not watching what she was eating; blood sugar at one point was over 400 With this her sugars were higher and in 2/16 her A1c was up to 9.7% At this point her Invokamet was increased to the 150/1000 twice a day dosage and no other changes made She may get a little tired and occasionally has to get up at night to  go to the bathroom but does not feel excessively thirsty She is now referred here for further management  Currently the patient is monitoring her blood sugars mostly in the morning and some in the evening after work. She thinks her blood sugars are in the 200+ range usually and only occasionally over 300. She has not been motivated to exercise and has been gaining weight. She thinks her diet is inconsistent and she wants more information on meal planning        Hypoglycemic drugs the patient is taking are: Invokamet 150/1000 twice a day,   Victoza 1.2 mg daily     Side effects from medications have been: None INSULIN regimen is described as: Levemir 20 units at bedtime    Compliance with the medical regimen: Inconsistent   Glucose monitoring:  done ?  one time a day         Glucometer: One Touch.      Blood Glucose readings by time of day by recall:  PREMEAL Breakfast Lunch Dinner Bedtime  Overall   Glucose range: 200-250  200-250 200+   Median:        Self-care: The diet that the patient has been following is: None    Meals: 3 meals per day. Breakfast is sausage biscuit or oatmeal, sometimes cereal.  Lunch is variable, frequently fast food.  Evening  meal is lighter.  Has snacks with peanut butter crackers   Usually has unsweetened drinks but does also drink bottled lemonade/ice tea which is not artificially sweetened   Exercise: none          Dietician visit, most recent: Never .               Weight history: 200-260  Wt Readings from Last 3 Encounters:  10/05/14 238 lb (107.956 kg)  03/29/14 238 lb 6.4 oz (108.138 kg)  07/07/11 224 lb 1.6 oz (101.651 kg)    Glycemic control:  No results found for: HGBA1C No results found for: GLUF, [place], [place], CREATININE       Medication List       This list is accurate as of: 10/05/14 11:14 AM.  Always use your most recent med list.               ALEVE 220 MG Caps  Generic drug:  Naproxen Sodium  Take 1 capsule by  mouth as needed.     naproxen sodium 550 MG tablet  Commonly known as:  ANAPROX DS  Take 1 tablet (550 mg total) by mouth 2 (two) times daily with a meal.     Insulin Detemir 100 UNIT/ML Pen  Commonly known as:  LEVEMIR  Inject 20 Units into the skin daily at 10 pm.     INVOKAMET PO  Take by mouth. 2 /day     JARDIANCE 25 MG Tabs tablet  Generic drug:  empagliflozin  Take by mouth once.     levonorgestrel 20 MCG/24HR IUD  Commonly known as:  MIRENA  1 each by Intrauterine route once.     Liraglutide 18 MG/3ML Sopn  Inject 16 mg into the skin.     metFORMIN 500 MG tablet  Commonly known as:  GLUCOPHAGE  Take 1,000 mg by mouth once.     ONE TOUCH ULTRA 2 W/DEVICE Kit  by Does not apply route.     TANZEUM 50 MG Pen  Generic drug:  Albiglutide  Inject into the skin.        Allergies: No Known Allergies  Past Medical History  Diagnosis Date  . Diabetes mellitus   . Back pain     No past surgical history on file.  Family History  Problem Relation Age of Onset  . Diabetes Father   . Diabetes Mother   . Fibromyalgia Mother     Social History:  reports that she has quit smoking. She has never used smokeless tobacco. She reports that she drinks alcohol. She reports that she does not use illicit drugs.    Review of Systems       Vision is normal.  She has not had an eye exam for several years.      No symptoms of headaches.       Lipids: Her last lipid panel showed triglycerides of 382 and LDL of 76 with HDL 35, previously LDL was 129. She was supposed to start on Lipitor from PCP but is not doing so      No results found for: CHOL, HDL, LDLCALC, LDLDIRECT, TRIG, CHOLHDL                Skin: No rash or infections     Thyroid:  No  unusual fatigue.  No history of thyroid disease.     The blood pressure has been normal on no medications      No swelling of feet.  No shortness of breath on exertion.   No palpitations, no chest pains using naproxen  as needed.     Bowel habits: Normal.      Has had some joint  pains.  Has history of low back problems including sciatica previously           There is a history of Numbness, tingling or burning in feet, sometimes pain and may take gabapentin as needed    If she tends to get stressed at times and also depressed and this will cause her to eat excessively  No candida infection except occasional itching treated with OTC antifungal cream  She has an IUD and has somewhat irregular bleeding  LABS:  Renal function normal in 2/16  Physical Examination:  BP 134/84 mmHg  Pulse 102  Temp(Src) 97.8 F (36.6 C) (Oral)  Ht '5\' 8"'  (1.727 m)  Wt 238 lb (107.956 kg)  BMI 36.20 kg/m2  SpO2 97%  GENERAL:         Patient has generalized obesity.   HEENT:         Eye exam shows normal external appearance. Fundus exam shows no retinopathy. Oral exam shows normal mucosa .  NECK:         General:  Neck exam shows no lymphadenopathy. Carotids are normal to palpation and no bruit heard.  Thyroid is not enlarged and no nodules felt.   LUNGS:         Chest is symmetrical. Lungs are clear to auscultation.[name]   HEART:         Heart sounds:  S1 and S2 are normal. No murmurs or clicks heard., no S3 or S4.   ABDOMEN:   There is no distention present. Liver and spleen are not palpable. No other mass or tenderness present.   NEUROLOGICAL:   Vibration sense is mildly reduced in toes. Ankle jerks are absent bilaterally.  Diabetic foot exam shows normal monofilament sensation in the toes and plantar surfaces, no skin lesions or ulcers on the feet and normal pedal pulses MUSCULOSKELETAL:       There is no enlargement or deformity of the joints. Spine is normal to inspection.[name]   SKIN:       No rash or lesions of concern.       EXTREMITIES:     There is no edema. No skin lesions present..  ASSESSMENT:  Diabetes type 2, uncontrolled with obesity and BMI 36 Currently the patient is on Invokamet and Victoza along with  low-dose insulin in the form of 20 units of Levemir once a day Her A1c is nearly 10% and her blood sugars are usually over 200 including fasting Discussed with patient that most likely she has insulin deficiency because of progression of her diabetes.  However she does need to make significant changes in her lifestyle with better diet and exercise regimen.  With her obesity she does have insulin resistance and is taking only 20 units of insulin which is inadequate   Also her compliance with diet and portion control may be better with increasing her Victoza. Not clear if she is benefiting from [place] at this point.  Complications: Symptoms of neuropathy present.  No evidence of retinopathy on her exam today, urine microalbumin needs to be checked  Hyperlipidemia: More recently has had high triglycerides were also previously had high LDL, currently not on medications  PLAN:   Increase Levemir insulin to 35 units.  She will titrate the dose every 3 days to get  her fasting sugar below 130  If she continues to have high postprandial blood sugars may consider adding mealtime insulin also  However if she has tendency to high readings before evening meal may consider longer acting insulin such as [place]  Increase Victoza to 1.8 mg  Consultation with dietitian  Given exercise regimen that she can do indoors  For now continue Invokamet  More postprandial blood sugars and these will be reviewed on her monitor download on the next visit  She will avoid drinks with sugar   Kaydi Kley 10/05/2014, 11:14 AM   Note: This office note was prepared with Dragon voice recognition system technology. Any transcriptional errors that result from this process are unintentional.

## 2014-11-09 ENCOUNTER — Encounter: Payer: Self-pay | Admitting: Endocrinology

## 2014-11-09 ENCOUNTER — Telehealth: Payer: Self-pay | Admitting: Endocrinology

## 2014-11-09 ENCOUNTER — Ambulatory Visit (INDEPENDENT_AMBULATORY_CARE_PROVIDER_SITE_OTHER): Payer: BLUE CROSS/BLUE SHIELD | Admitting: Endocrinology

## 2014-11-09 VITALS — BP 138/104 | HR 98 | Temp 98.0°F | Resp 14 | Ht 68.0 in | Wt 240.8 lb

## 2014-11-09 DIAGNOSIS — IMO0002 Reserved for concepts with insufficient information to code with codable children: Secondary | ICD-10-CM

## 2014-11-09 DIAGNOSIS — E1165 Type 2 diabetes mellitus with hyperglycemia: Secondary | ICD-10-CM

## 2014-11-09 DIAGNOSIS — E781 Pure hyperglyceridemia: Secondary | ICD-10-CM

## 2014-11-09 DIAGNOSIS — E669 Obesity, unspecified: Secondary | ICD-10-CM

## 2014-11-09 NOTE — Telephone Encounter (Signed)
Left message with instructions on patients vm 

## 2014-11-09 NOTE — Telephone Encounter (Signed)
Patient said she only takes it sporadically.

## 2014-11-09 NOTE — Telephone Encounter (Signed)
Her blood pressure was high on the office visit.  If she is taking phentermine she needs to stop this

## 2014-11-09 NOTE — Patient Instructions (Addendum)
Please check blood sugars at least half the time about 2 hours after any meal esp. Lunch and 3 times per week on waking up.  Please bring blood sugar monitor to each visit. Recommended blood sugar levels about 2 hours after meal is 140-180 and on waking up 90-130  Levemir 45 units in am and call if sugar higher  Walk daily

## 2014-11-09 NOTE — Telephone Encounter (Signed)
She needs to stop it completely and follow-up for blood pressure with Dr. Channing Muttersoy

## 2014-11-09 NOTE — Progress Notes (Signed)
Patient ID: Pamela Shannon, female   DOB: 06/26/1966, 49 y.o.   MRN: [id number]           Reason for Appointment: follow-up for Type 2 Diabetes  Referring physician: Mellody Drown  History of Present Illness:          Diagnosis: Type 2 diabetes mellitus, date of diagnosis: ?  2011       Past history:  At diagnosis she was having symptoms of marked fatigue, dry mouth but not clear how high her sugars were. She has been on various medications for treating her diabetes, probably metformin to start with. Also around the time of diagnosis she thinks she weighed about 300 pounds. She thinks she was able to lose weight subsequently and about 2 years ago had lost down to nearly 200 pounds All the details are not available of her previous management but she appears to have had progressively worsening requiring multiple agents and additional therapies. She thinks her A1c has been over 9% for about 2 years or more, lab records are not available She probably had been on metformin along with Tanzeum about a year or so ago. Because of inadequate control she was also given Invokamet at some point.  In 9/15 she was switched from Tanzeum to [place] and started on bedtime Levemir insulin, 20 units  Recent history: Because of her A1c of over 9% and blood sugars at home usually over 200 she was told to increase her Levemir significantly in 2/16 up to 35 units at bedtime. She was also given her titration sheet for increasing her insulin which she has done Currently taking 43 units Also her Victoza was increased to 1.8 mg She continues to take maximum dose Invokamet. She was advised to watch her diet better especially at lunchtime and she is trying to get more salads However has not started exercising as directed Her blood sugars are being checked mostly before breakfast and supper and are still not controlled, however overall better than before       Hypoglycemic drugs the patient is taking are: Invokamet 150/1000  twice a day,   Victoza 1.8 mg daily     Side effects from medications have been: None INSULIN regimen is described as: Levemir 43 units at bedtime    Compliance with the medical regimen: Inconsistent   Glucose monitoring:  done 1.8 times a day         Glucometer: One Touch.      Blood Glucose readings by time of day    PRE-MEAL Breakfast  PC lunch  Dinner  overnight  Overall  Glucose range:  113-193   143   160-194   144, 152    Average:  150      161      Self-care: The diet that the patient has been following is: None    Meals: 3 meals per day. Breakfast is sausage biscuit or oatmeal, sometimes cereal.   Lunch is variable, less fast food.  Evening meal is lighter.   Has snacks with peanut butter crackers   Usually has unsweetened drinks but does also drink bottled lemonade/ice tea which is artificially sweetened   Exercise: none          Dietician visit, most recent: Never                Weight history: 200-260  Wt Readings from Last 3 Encounters:  11/09/14 240 lb 12.8 oz (109.226 kg)  10/05/14 238 lb (107.956 kg)  03/29/14  238 lb 6.4 oz (108.138 kg)    Glycemic control:2/16 her A1c was  to 9.7%   No results found for: HGBA1C No results found for: GLUF, MICROALBUR, [place], CREATININE       Medication List       This list is accurate as of: 11/09/14  1:23 PM.  Always use your most recent med list.               ALEVE 220 MG Caps  Generic drug:  Naproxen Sodium  Take 1 capsule by mouth as needed.     naproxen sodium 550 MG tablet  Commonly known as:  ANAPROX DS  Take 1 tablet (550 mg total) by mouth 2 (two) times daily with a meal.     Insulin Detemir 100 UNIT/ML Pen  Commonly known as:  LEVEMIR  Inject 20 Units into the skin daily at 10 pm.     INVOKAMET [id number] MG Tabs  Generic drug:  Canagliflozin-Metformin HCl     levonorgestrel 20 MCG/24HR IUD  Commonly known as:  MIRENA  1 each by Intrauterine route once.     Liraglutide 18 MG/3ML Sopn  1.8 mg  once a day     ONE TOUCH ULTRA 2 W/DEVICE Kit  by Does not apply route.     phentermine 37.5 MG tablet  Commonly known as:  ADIPEX-P     vitamin C 1000 MG tablet  Take 1,000 mg by mouth daily.        Allergies: No Known Allergies  Past Medical History  Diagnosis Date  . Diabetes mellitus   . Back pain     No past surgical history on file.  Family History  Problem Relation Age of Onset  . Diabetes Father   . Diabetes Mother   . Fibromyalgia Mother     Social History:  reports that she has quit smoking. She has never used smokeless tobacco. She reports that she drinks alcohol. She reports that she does not use illicit drugs.    Review of Systems         Lipids: Her last lipid panel showed triglycerides of 382 and LDL of 76 with HDL 35, previously LDL was 129. She was supposed to start on Lipitor from PCP but is not doing so      No results found for: CHOL, HDL, LDLCALC, LDLDIRECT, TRIG, CHOLHDL                  Has had some joint  pains.  Has history of low back problems including sciatica previously           There is a history of Numbness, tingling or burning in feet, sometimes pain and may take gabapentin as needed   LABS:  Renal function normal in 2/16  Physical Examination:  BP 138/104 mmHg  Pulse 98  Temp(Src) 98 F (36.7 C)  Resp 14  Ht '5\' 8"'  (1.727 m)  Wt 240 lb 12.8 oz (109.226 kg)  BMI 36.62 kg/m2  SpO2 96%  Exam not indicated  ASSESSMENT/PLAN:  Diabetes type 2, uncontrolled with obesity and BMI 36 Currently the patient is on Invokamet, basal insulin and Victoza 1.8 mg However blood sugars appear to be overall better with increasing her Levemir insulin and Victoza but still not controlled See history of present illness for current management, blood sugar patterns and problems identified   Since her blood sugars are relatively higher in the evenings compared to the mornings may do better with switching  Levemir to the morning, for now  will increase her dose to 45 minutes  Also possibly may need Levemir twice a day, may also do better with switching to [place] if covered  She needs a consultation with the dietitian  Encouraged her to start exercise program which she has not been motivated to do as yet  Also reminded her to check more readings after lunch and supper to help assess the need for mealtime insulin  Patient is concerned about her job because of the driving requirement and not being able to take insulin with this However discussed with the patient that she has had progressive increase in her insulin deficiency and will not get control with stopping insulin at all She is a good candidate for bariatric surgery but she is reluctant to consider this, discussed advantages of this and may be able to do a gastric sleeve instead of the bypass  Preventive care: She will need to have urine microalbumin and eye exams done  Counseling time over 50% of today's 25 minute visit  Bland Rudzinski 11/09/2014, 1:23 PM   Note: This office note was prepared with Estate agent. Any transcriptional errors that result from this process are unintentional.  Addendum: Her blood pressure is unusually high today, needs to follow-up with PCP and also stop phentermine if she is still taking this

## 2014-11-16 DIAGNOSIS — Z0279 Encounter for issue of other medical certificate: Secondary | ICD-10-CM

## 2014-11-19 ENCOUNTER — Encounter: Payer: BLUE CROSS/BLUE SHIELD | Attending: Endocrinology | Admitting: Dietician

## 2014-11-19 ENCOUNTER — Encounter: Payer: Self-pay | Admitting: Dietician

## 2014-11-19 VITALS — Ht 69.0 in | Wt 236.0 lb

## 2014-11-19 DIAGNOSIS — IMO0002 Reserved for concepts with insufficient information to code with codable children: Secondary | ICD-10-CM

## 2014-11-19 DIAGNOSIS — Z794 Long term (current) use of insulin: Secondary | ICD-10-CM | POA: Diagnosis not present

## 2014-11-19 DIAGNOSIS — Z713 Dietary counseling and surveillance: Secondary | ICD-10-CM | POA: Diagnosis not present

## 2014-11-19 DIAGNOSIS — E1165 Type 2 diabetes mellitus with hyperglycemia: Secondary | ICD-10-CM | POA: Insufficient documentation

## 2014-11-19 NOTE — Patient Instructions (Signed)
Plan:  Aim for 2-3 Carb Choices per meal (30-45 grams) +/- 1 either way  Aim for 0-2 Carbs per snack if hungry  Include protein in moderation with your meals and snacks Consider reading food labels for Total Carbohydrate and Fat Grams of foods Keep up the walking.  Great job! Consider checking BG at alternate times per day as directed by MD  Consider taking medication as directed by MD

## 2014-11-19 NOTE — Progress Notes (Signed)
Medical Nutrition Therapy:  Appt start time: 1430 end time:  1545.   Assessment:  Primary concerns today: Patient is here today with her granddaughter.  She has had type 2 diabetes for 2-3 years.  "I was diagnosed with Diabetes and lost 100 lbs 8-9 years ago.  Got off of medicine but regained weight and required treatment again."  Patient would like to learn how to lose weigh and control DM with diet so that she can get off of the insulin.  Weight today was 236 lbs which is down 4 lbs from 1 week ago.  Patient states that she has begun changing her diet.  Has started to drink protein shakes 1-2 daily and eat lower fat.  Patient would like to lose to 165 lbs but was able to get off of her DM meds at 200 lbs in the past.  Per patient last HgbA1C was 9.7% 2-3 months ago.  Patient checks her blood sugar 4 times per day.  AM glucose 120 and after meals 156.    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  She is currently on FMLA.  Patient lives with her sons and provides the only income to home.  Patient states that she was recently anointed in church and stated that she knows she also needs to do her part in helping control the diabetes.  Patient worked 8-12 hour days.   Preferred Learning Style:   No preference indicated   Learning Readiness:   Not ready  Contemplating  Ready  Change in progress   MEDICATIONS: see list:  Includes invokamet,  Victosa, Levemir,   DIETARY INTAKE: 1 week ago: sausage biscuit, instant oatmeal, girlscout cookies, fast food for lunch, dinner:  Yogurt, nachos  24-hr recall:  B (9 AM): fruit smoothie with protein mix  Snk (12 AM):yogurt or carrots with ranch L (2PM): salad or chicken wrap or beanie weenies and yogurt Snk ( PM): almonds and peanuts or yogurts or carrots D ( PM): protein shake or chicken salad sandwich and potato chips  Snk ( PM): peanuts and almonds Beverages: water, unsweetened tea, occasional diet  soda  Usual physical activity: walking daily for 2-4 miles usually  Estimated energy needs: 1400 calories 158 g carbohydrates 88 g protein 47 g fat  Progress Towards Goal(s):  In progress.   Nutritional Diagnosis:  NB-1.1 Food and nutrition-related knowledge deficit As related to balance of carbohydrate, protein, and fat.  As evidenced by patient report.    Intervention:  Nutrition counseling and diabetes education initiated. Discussed Carb Counting by food group as method of portion control, reading food labels, and benefits of increased activity. Also discussed basic physiology of Diabetes, target BG ranges pre and post meals, and A1c.  . Plan:  Aim for 2-3 Carb Choices per meal (30-45 grams) +/- 1 either way  Aim for 0-2 Carbs per snack if hungry  Include protein in moderation with your meals and snacks Consider reading food labels for Total Carbohydrate and Fat Grams of foods Keep up the walking.  Great job! Consider checking BG at alternate times per day as directed by MD  Consider taking medication as directed by MD  Teaching Method Utilized:  Visual Auditory Hands on  Handouts given during visit include:  My plate  Living well with diabetes  Meal plan card  Label reading  Support group  Snack list  Barriers to learning/adherence to lifestyle change: none  Demonstrated degree of  understanding via:  Teach Back   Monitoring/Evaluation:  Dietary intake, exercise, label reading, and body weight prn.

## 2014-11-23 ENCOUNTER — Encounter: Payer: Self-pay | Admitting: Endocrinology

## 2014-11-23 DIAGNOSIS — Z0279 Encounter for issue of other medical certificate: Secondary | ICD-10-CM

## 2014-12-01 ENCOUNTER — Encounter (INDEPENDENT_AMBULATORY_CARE_PROVIDER_SITE_OTHER): Payer: BLUE CROSS/BLUE SHIELD | Admitting: Ophthalmology

## 2014-12-01 DIAGNOSIS — E119 Type 2 diabetes mellitus without complications: Secondary | ICD-10-CM

## 2014-12-01 DIAGNOSIS — H2513 Age-related nuclear cataract, bilateral: Secondary | ICD-10-CM | POA: Diagnosis not present

## 2014-12-01 DIAGNOSIS — E11319 Type 2 diabetes mellitus with unspecified diabetic retinopathy without macular edema: Secondary | ICD-10-CM

## 2014-12-01 DIAGNOSIS — H43813 Vitreous degeneration, bilateral: Secondary | ICD-10-CM | POA: Diagnosis not present

## 2014-12-02 ENCOUNTER — Other Ambulatory Visit (INDEPENDENT_AMBULATORY_CARE_PROVIDER_SITE_OTHER): Payer: BLUE CROSS/BLUE SHIELD

## 2014-12-02 DIAGNOSIS — IMO0002 Reserved for concepts with insufficient information to code with codable children: Secondary | ICD-10-CM

## 2014-12-02 DIAGNOSIS — E1165 Type 2 diabetes mellitus with hyperglycemia: Secondary | ICD-10-CM | POA: Diagnosis not present

## 2014-12-02 LAB — URINALYSIS, ROUTINE W REFLEX MICROSCOPIC
Bilirubin Urine: NEGATIVE
KETONES UR: NEGATIVE
Leukocytes, UA: NEGATIVE
Nitrite: NEGATIVE
PH: 5 (ref 5.0–8.0)
SPECIFIC GRAVITY, URINE: 1.02 (ref 1.000–1.030)
Total Protein, Urine: NEGATIVE
UROBILINOGEN UA: 0.2 (ref 0.0–1.0)
Urine Glucose: 250 — AB

## 2014-12-02 LAB — BASIC METABOLIC PANEL
BUN: 15 mg/dL (ref 6–23)
CHLORIDE: 103 meq/L (ref 96–112)
CO2: 25 meq/L (ref 19–32)
Calcium: 9.7 mg/dL (ref 8.4–10.5)
Creatinine, Ser: 0.6 mg/dL (ref 0.40–1.20)
GFR: 112.92 mL/min (ref >60.00)
Glucose, Bld: 142 mg/dL — ABNORMAL HIGH (ref 70–99)
POTASSIUM: 3.9 meq/L (ref 3.5–5.1)
Sodium: 136 mEq/L (ref 135–145)

## 2014-12-02 LAB — MICROALBUMIN / CREATININE URINE RATIO
Creatinine,U: 58.1 mg/dL
Microalb Creat Ratio: 8.4 mg/g (ref 0.0–30.0)
Microalb, Ur: 4.9 mg/dL — ABNORMAL HIGH (ref 0.0–1.9)

## 2014-12-03 LAB — FRUCTOSAMINE: Fructosamine: 254 umol/L (ref 0–285)

## 2014-12-07 ENCOUNTER — Other Ambulatory Visit: Payer: Self-pay | Admitting: *Deleted

## 2014-12-07 ENCOUNTER — Ambulatory Visit (INDEPENDENT_AMBULATORY_CARE_PROVIDER_SITE_OTHER): Payer: BLUE CROSS/BLUE SHIELD | Admitting: Endocrinology

## 2014-12-07 ENCOUNTER — Encounter: Payer: Self-pay | Admitting: Endocrinology

## 2014-12-07 VITALS — BP 132/80 | HR 82 | Temp 98.1°F | Ht 68.0 in | Wt 233.0 lb

## 2014-12-07 DIAGNOSIS — E1165 Type 2 diabetes mellitus with hyperglycemia: Secondary | ICD-10-CM | POA: Diagnosis not present

## 2014-12-07 DIAGNOSIS — E781 Pure hyperglyceridemia: Secondary | ICD-10-CM | POA: Diagnosis not present

## 2014-12-07 DIAGNOSIS — E118 Type 2 diabetes mellitus with unspecified complications: Secondary | ICD-10-CM | POA: Insufficient documentation

## 2014-12-07 DIAGNOSIS — IMO0002 Reserved for concepts with insufficient information to code with codable children: Secondary | ICD-10-CM

## 2014-12-07 MED ORDER — LIRAGLUTIDE -WEIGHT MANAGEMENT 18 MG/3ML ~~LOC~~ SOPN: 3.0000 mg | PEN_INJECTOR | Freq: Every day | SUBCUTANEOUS | Status: DC

## 2014-12-07 MED ORDER — GLIMEPIRIDE 4 MG PO TABS: 4.0000 mg | ORAL_TABLET | Freq: Every day | ORAL | Status: DC

## 2014-12-07 NOTE — Patient Instructions (Addendum)
Levemir 48 units daily to keep supper sugar <140  Trial of Glimeperide 4mg  in am  Please check blood sugars at least half the time about 2 hours after any meal and 3 times per week on waking up. Please bring blood sugar monitor to each visit. Recommended blood sugar levels about 2 hours after meal is 140-180 and on waking up 90-130  Check coverage for Saxenda

## 2014-12-07 NOTE — Progress Notes (Signed)
Patient ID: Pamela Shannon, female   DOB: [birth year], 49 y.o.   MRN: [id number]           Reason for Appointment: follow-up for Type 2 Diabetes  Referring physician: Mellody Drown  History of Present Illness:          Diagnosis: Type 2 diabetes mellitus, date of diagnosis: ?  2011       Past history:  At diagnosis she was having symptoms of marked fatigue, dry mouth but not clear how high her sugars were. She has been on various medications for treating her diabetes, probably metformin to start with. Also around the time of diagnosis she thinks she weighed about 300 pounds. She thinks she was able to lose weight subsequently and about 2 years ago had lost down to nearly 200 pounds All the details are not available of her previous management but she appears to have had progressively worsening requiring multiple agents and additional therapies. She thinks her A1c has been over 9% for about 2 years or more, lab records are not available She probably had been on metformin along with Tanzeum about a year or so ago. Because of inadequate control she was also given Invokamet at some point.  In 9/15 she was switched from Tanzeum to [place] and started on bedtime Levemir insulin, 20 units  Recent history:  INSULIN regimen is described as: Levemir 45 units in the morning  Because of her A1c of over 9% and blood sugars at home usually over 200 she was told to increase her Levemir Also because of relatively higher readings in the evenings her dose was switched to the morning on her last visit in 3/16 Also her Victoza was increased to 1.8 mg She continues to take maximum dose Invokamet. She was also given her titration sheet for increasing her insulin but she has continued only 45 units since her morning readings have been better Current blood sugar patterns and problems identified:  More recently her fasting readings are near normal with only a couple of high readings  She is trying to take her LEVEMIR  after breakfast but on occasion has forgotten this and will then take it in the evening  She does not have many readings before lunch and these are variable  Blood sugars in the afternoon after lunch are averaging about 168 and probably a little better in the last few days  Blood sugars after supper are also somewhat variable but overall better than in the afternoon  Recently she has  started exercising as directed since she is not working She thinks she has made significant changes in her meal planning after seeing the dietitian and also is trying to cut back on portions even though she has difficulty doing this despite taking Victoza Fructosamine indicates improved control        Hypoglycemic drugs the patient is taking are: Invokamet 150/1000 twice a day,   Victoza 1.8 mg daily     Side effects from medications have been: None Compliance with the medical regimen: Fair but improving   Glucose monitoring:  done 1.8 times a day         Glucometer: One Touch.      Blood Glucose readings by time of day    PRE-MEAL Breakfast Lunch  5-7 PM   7-11 PM  Overall  Glucose range:  99-179   117-197   120-211   114-220    Median: 125   186   165  149  Self-care: The diet that the patient has been following is: None    Meals: 3 meals per day. Breakfast is sausage biscuit or oatmeal, sometimes cereal.   Lunch is variable, less fast food.  Evening meal is lighter 6-7. Has snacks with peanut butter crackers   Usually has unsweetened drinks  Exercise:  Walking 4/7 days a week recently           Dietician visit, most recent: 11/19/14                Weight history: 200-260  Wt Readings from Last 3 Encounters:  12/07/14 233 lb (105.688 kg)  11/19/14 236 lb (107.049 kg)  11/09/14 240 lb 12.8 oz (109.226 kg)    Glycemic control:2/16 her A1c was  to 9.7%   No results found for: HGBA1C Lab Results  Component Value Date   MICROALBUR 4.9* 12/02/2014   CREATININE 0.60 12/02/2014    Lab on  12/02/2014  Component Date Value Ref Range Status  . Fructosamine 12/02/2014 254  0 - 285 umol/L Final   Comment: Published reference interval for apparently healthy subjects between age 66 and 95 is 65 - 285 umol/L and in a poorly controlled diabetic population is 228 - 563 umol/L with a mean of 396 umol/L.   [name] Sodium 12/02/2014 136  135 - 145 mEq/L Final  . Potassium 12/02/2014 3.9  3.5 - 5.1 mEq/L Final  . Chloride 12/02/2014 103  96 - 112 mEq/L Final  . CO2 12/02/2014 25  19 - 32 mEq/L Final  . Glucose, Bld 12/02/2014 142* 70 - 99 mg/dL Final  . BUN 12/02/2014 15  6 - 23 mg/dL Final  . Creatinine, Ser 12/02/2014 0.60  0.40 - 1.20 mg/dL Final  . Calcium 12/02/2014 9.7  8.4 - 10.5 mg/dL Final  . GFR 12/02/2014 112.92  >60.00 mL/min Final  . Microalb, Ur 12/02/2014 4.9* 0.0 - 1.9 mg/dL Final  . Creatinine,U 12/02/2014 58.1   Final  . Microalb Creat Ratio 12/02/2014 8.4  0.0 - 30.0 mg/g Final  . Color, Urine 12/02/2014 YELLOW  Yellow;Lt. Yellow Final  . APPearance 12/02/2014 CLEAR  Clear Final  . Specific Gravity, Urine 12/02/2014 1.020  1.000-1.030 Final  . pH 12/02/2014 5.0  5.0 - 8.0 Final  . Total Protein, Urine 12/02/2014 NEGATIVE  Negative Final  . Urine Glucose 12/02/2014 250* Negative Final   Results faxed to site/floor on 12/02/2014 12:15 PM by Delorise Jackson.  [name] Ketones, ur 12/02/2014 NEGATIVE  Negative Final  . Bilirubin Urine 12/02/2014 NEGATIVE  Negative Final  . Hgb urine dipstick 12/02/2014 SMALL* Negative Final  . Urobilinogen, UA 12/02/2014 0.2  0.0 - 1.0 Final  . Leukocytes, UA 12/02/2014 NEGATIVE  Negative Final  . Nitrite 12/02/2014 NEGATIVE  Negative Final  . WBC, UA 12/02/2014 0-2/hpf  0-2/hpf Final  . RBC / HPF 12/02/2014 0-2/hpf  0-2/hpf Final  . Squamous Epithelial / LPF 12/02/2014 Rare(0-4/hpf)  Rare(0-4/hpf) Final        Medication List       This list is accurate as of: 12/07/14 12:18 PM.  Always use your most recent med list.                ALEVE 220 MG Caps  Generic drug:  Naproxen Sodium  Take 1 capsule by mouth as needed.     naproxen sodium 550 MG tablet  Commonly known as:  ANAPROX DS  Take 1 tablet (550 mg total) by mouth 2 (two) times daily with a  meal.     glimepiride 4 MG tablet  Commonly known as:  AMARYL  Take 1 tablet (4 mg total) by mouth daily before breakfast.     Insulin Detemir 100 UNIT/ML Pen  Commonly known as:  LEVEMIR  Inject 45 Units into the skin every morning.     INVOKAMET [phone] MG Tabs  Generic drug:  Canagliflozin-Metformin HCl     levonorgestrel 20 MCG/24HR IUD  Commonly known as:  MIRENA  1 each by Intrauterine route once.     Liraglutide 18 MG/3ML Sopn  1.8 mg once a day     MULTIVITAMIN ADULT PO  Take by mouth.     ONE TOUCH ULTRA 2 W/DEVICE Kit  by Does not apply route.     vitamin C 1000 MG tablet  Take 1,000 mg by mouth daily.        Allergies: No Known Allergies  Past Medical History  Diagnosis Date  . Diabetes mellitus   . Back pain     No past surgical history on file.  Family History  Problem Relation Age of Onset  . Diabetes Father   . Diabetes Mother   . Fibromyalgia Mother     Social History:  reports that she has quit smoking. She has never used smokeless tobacco. She reports that she drinks alcohol. She reports that she does not use illicit drugs.    Review of Systems         Lipids: Her last lipid panel showed triglycerides of 382 and LDL of 76 with HDL 35, previously LDL was 129. She was supposed to start on Lipitor from PCP and has not had any follow-up      No results found for: CHOL, HDL, LDLCALC, LDLDIRECT, TRIG, CHOLHDL                       There is a history of Numbness, tingling or burning in feet, sometimes pain and may take gabapentin as needed    Physical Examination:  BP 132/80 mmHg  Pulse 82  Temp(Src) 98.1 F (36.7 C) (Oral)  Ht '5\' 8"'  (1.727 m)  Wt 233 lb (105.688 kg)  BMI 35.44 kg/m2  SpO2  97%   ASSESSMENT/PLAN:  Diabetes type 2, uncontrolled with obesity and BMI 36 See history of present illness for current management, blood sugar patterns and problems identified  Currently the patient is on Invokamet, basal insulin with LEVEMIR and Victoza 1.8 mg Her fructosamine indicates somewhat better control overall and she has done better with her diet, also starting exercise regimen However blood sugars appear to be overall better with changing her Levemir to the morning and her dose has not been increased significantly Currently her blood sugars are gradually increasing throughout the day and does not appear to have adequate mealtime control consistently  For now will recommend the following:  Increase dose of Levemir and adjust based on suppertime reading  Check more readings after meals and call if consistently high  Trial of Amaryl 4 mg in the morning to see if postprandial readings are better and also if she can reduce her insulin.  She will have more difficulty with getting her job if she is taking more intensive insulin therapy  She can check on the coverage for Saxenda as she does not get enough satiety from taking Victoza  Recheck lipids   Patient Instructions  Levemir 48 units daily to keep supper sugar <140  Trial of Glimeperide 7m in am  Please check blood sugars at least half the time about 2 hours after any meal and 3 times per week on waking up. Please bring blood sugar monitor to each visit. Recommended blood sugar levels about 2 hours after meal is 140-180 and on waking up 90-130  Check coverage for Saxenda     Counseling time over 50% of today's 25 minute visit  Ely Spragg 12/07/2014, 12:18 PM   Note: This office note was prepared with Estate agent. Any transcriptional errors that result from this process are unintentional.

## 2015-02-02 ENCOUNTER — Other Ambulatory Visit (INDEPENDENT_AMBULATORY_CARE_PROVIDER_SITE_OTHER): Payer: BLUE CROSS/BLUE SHIELD

## 2015-02-02 DIAGNOSIS — IMO0002 Reserved for concepts with insufficient information to code with codable children: Secondary | ICD-10-CM

## 2015-02-02 DIAGNOSIS — E1165 Type 2 diabetes mellitus with hyperglycemia: Secondary | ICD-10-CM

## 2015-02-02 LAB — BASIC METABOLIC PANEL
BUN: 16 mg/dL (ref 6–23)
CALCIUM: 10.5 mg/dL (ref 8.4–10.5)
CO2: 30 meq/L (ref 19–32)
CREATININE: 0.61 mg/dL (ref 0.40–1.20)
Chloride: 99 mEq/L (ref 96–112)
GFR: 110.71 mL/min (ref >60.00)
GLUCOSE: 159 mg/dL — AB (ref 70–99)
POTASSIUM: 3.5 meq/L (ref 3.5–5.1)
Sodium: 137 mEq/L (ref 135–145)

## 2015-02-02 LAB — LIPID PANEL
Cholesterol: 170 mg/dL (ref 0–200)
HDL: 37.4 mg/dL — ABNORMAL LOW (ref >39.00)
NonHDL: 132.6
TRIGLYCERIDES: 272 mg/dL — AB (ref 0.0–149.0)
Total CHOL/HDL Ratio: 5
VLDL: 54.4 mg/dL — ABNORMAL HIGH (ref 0.0–40.0)

## 2015-02-02 LAB — HEMOGLOBIN A1C: HEMOGLOBIN A1C: 6.7 % — AB (ref 4.6–6.5)

## 2015-02-02 LAB — LDL CHOLESTEROL, DIRECT: Direct LDL: 90 mg/dL

## 2015-02-05 ENCOUNTER — Ambulatory Visit (INDEPENDENT_AMBULATORY_CARE_PROVIDER_SITE_OTHER): Payer: BLUE CROSS/BLUE SHIELD | Admitting: Endocrinology

## 2015-02-05 ENCOUNTER — Encounter: Payer: Self-pay | Admitting: Endocrinology

## 2015-02-05 ENCOUNTER — Other Ambulatory Visit: Payer: Self-pay

## 2015-02-05 VITALS — BP 122/84 | HR 104 | Temp 98.4°F | Resp 15 | Wt 234.0 lb

## 2015-02-05 DIAGNOSIS — E781 Pure hyperglyceridemia: Secondary | ICD-10-CM | POA: Diagnosis not present

## 2015-02-05 DIAGNOSIS — E785 Hyperlipidemia, unspecified: Secondary | ICD-10-CM | POA: Insufficient documentation

## 2015-02-05 DIAGNOSIS — E119 Type 2 diabetes mellitus without complications: Secondary | ICD-10-CM

## 2015-02-05 DIAGNOSIS — R030 Elevated blood-pressure reading, without diagnosis of hypertension: Secondary | ICD-10-CM

## 2015-02-05 DIAGNOSIS — E1169 Type 2 diabetes mellitus with other specified complication: Secondary | ICD-10-CM | POA: Insufficient documentation

## 2015-02-05 MED ORDER — BUPROPION HCL ER (SR) 150 MG PO TB12: 150.0000 mg | ORAL_TABLET | Freq: Every day | ORAL | Status: DC

## 2015-02-05 MED ORDER — INSULIN DETEMIR 100 UNIT/ML FLEXPEN: 48.0000 [IU] | PEN_INJECTOR | Freq: Every morning | SUBCUTANEOUS | Status: DC

## 2015-02-05 NOTE — Progress Notes (Signed)
Patient ID: Pamela Shannon, female   DOB: [birth year], 49 y.o.   MRN: [id number]           Reason for Appointment: follow-up for Type 2 Diabetes  Referring physician: Mellody Drown  History of Present Illness:          Diagnosis: Type 2 diabetes mellitus, date of diagnosis: ?  2011       Past history:  At diagnosis she was having symptoms of marked fatigue, dry mouth but not clear how high her sugars were. She has been on various medications for treating her diabetes, probably metformin to start with. Also around the time of diagnosis she thinks she weighed about 300 pounds. She thinks she was able to lose weight subsequently and about 2 years ago had lost down to nearly 200 pounds All the details are not available of her previous management but she appears to have had progressively worsening requiring multiple agents and additional therapies. She thinks her A1c has been over 9% for about 2 years or more, lab records are not available She probably had been on metformin along with Tanzeum about a year or so ago. Because of inadequate control she was also given Invokamet at some point.  In 9/15 she was switched from Tanzeum to [place] and started on bedtime Levemir insulin, 20 units  Recent history:  INSULIN regimen is described as: Levemir 48 units in the morning  Because of her A1c of over 9% and blood sugars at home usually over 200 she was told to increase her Levemir She does better with her Levemir in the morning as blood sugars are better controlled in the afternoon with this Also her Victoza was changed to [place] on her last visit in 4/16 Although she was given a prescription for Amaryl to take in the morning she somehow did not get this Her blood sugars are however much better and has only sporadic high readings  She continues to take maximum dose Invokamet.  Current blood sugar patterns and problems identified:  More recently her postprandial readings are not as high as before and  overall average blood sugar is improved  Her A1c is excellent now at 6.7  She said that because she is not able to work she is sometimes getting depressed and tends to overeat  She has not been able to lose any weight despite switching from Victoza to [place]   fasting readings are near normal again  She has done some exercise but somewhat inconsistently and does not like to walk in the hot weather  She is overall trying to improve her choices for what she is eating but does not do this consistently because of carbohydrate cravings  Fructosamine indicates improved control        Hypoglycemic drugs the patient is taking are: Invokamet 150/1000 twice a day,   Victoza 1.8 mg daily     Side effects from medications have been: None Compliance with the medical regimen: Fair but improving   Glucose monitoring:  done 1.8 times a day         Glucometer: One Touch.      Blood Glucose readings by time of day    Mean values apply above for all meters except median for One Touch  PRE-MEAL Fasting Lunch Dinner Bedtime Overall  Glucose range:  93-132      Mean/median:  116      124    POST-MEAL PC Breakfast PC Lunch PC Dinner  Glucose range:  96-156  177-191   109-192   Mean/median:   175   150      Self-care: The diet that the patient has been following is: None    Meals: 3 meals per day. Breakfast is sausage biscuit or oatmeal, sometimes cereal. .  Evening meal is 6-7 PM. Has snacks with peanut butter crackers   Usually has unsweetened drinks  Exercise:  Walking or swim at times           Dietician visit, most recent: 11/19/14                Weight history: 200-260  Wt Readings from Last 3 Encounters:  02/05/15 234 lb (106.142 kg)  12/07/14 233 lb (105.688 kg)  11/19/14 236 lb (107.049 kg)   Glycemic control:2/16 her A1c was 9.7%    Lab Results  Component Value Date   HGBA1C 6.7* 02/02/2015   Lab Results  Component Value Date   MICROALBUR 4.9* 12/02/2014   CREATININE 0.61  02/02/2015    Lab on 02/02/2015  Component Date Value Ref Range Status  . Hgb A1c MFr Bld 02/02/2015 6.7* 4.6 - 6.5 % Final   Glycemic Control Guidelines for People with Diabetes:Non Diabetic:  <6%Goal of Therapy: <7%Additional Action Suggested:  >8%   . Sodium 02/02/2015 137  135 - 145 mEq/L Final  . Potassium 02/02/2015 3.5  3.5 - 5.1 mEq/L Final  . Chloride 02/02/2015 99  96 - 112 mEq/L Final  . CO2 02/02/2015 30  19 - 32 mEq/L Final  . Glucose, Bld 02/02/2015 159* 70 - 99 mg/dL Final  . BUN 02/02/2015 16  6 - 23 mg/dL Final  . Creatinine, Ser 02/02/2015 0.61  0.40 - 1.20 mg/dL Final  . Calcium 02/02/2015 10.5  8.4 - 10.5 mg/dL Final  . GFR 02/02/2015 110.71  >60.00 mL/min Final  . Cholesterol 02/02/2015 170  0 - 200 mg/dL Final   ATP III Classification       Desirable:  < 200 mg/dL               Borderline High:  200 - 239 mg/dL          High:  > = 240 mg/dL  . Triglycerides 02/02/2015 272.0* 0.0 - 149.0 mg/dL Final   Normal:  <150 mg/dLBorderline High:  150 - 199 mg/dL  . HDL 02/02/2015 37.40* >39.00 mg/dL Final  . VLDL 02/02/2015 54.4* 0.0 - 40.0 mg/dL Final  . Total CHOL/HDL Ratio 02/02/2015 5   Final                  Men          Women1/2 Average Risk     3.4          3.3Average Risk          5.0          4.42X Average Risk          9.6          7.13X Average Risk          15.0          11.0                      . NonHDL 02/02/2015 132.60   Final   NOTE:  Non-HDL goal should be 30 mg/dL higher than patient's LDL goal (i.e. LDL goal of < 70 mg/dL, would have non-HDL goal of < 100 mg/dL)  . Direct LDL  02/02/2015 90.0   Final   Optimal:  <100 mg/dLNear or Above Optimal:  100-129 mg/dLBorderline High:  130-159 mg/dLHigh:  160-189 mg/dLVery High:  >190 mg/dL        Medication List       This list is accurate as of: 02/05/15  3:59 PM.  Always use your most recent med list.               ALEVE 220 MG Caps  Generic drug:  Naproxen Sodium  Take 1 capsule by mouth as needed.       naproxen sodium 550 MG tablet  Commonly known as:  ANAPROX DS  Take 1 tablet (550 mg total) by mouth 2 (two) times daily with a meal.     buPROPion 150 MG 12 hr tablet  Commonly known as:  WELLBUTRIN SR  Take 1 tablet (150 mg total) by mouth daily.     glimepiride 4 MG tablet  Commonly known as:  AMARYL  Take 1 tablet (4 mg total) by mouth daily before breakfast.     Insulin Detemir 100 UNIT/ML Pen  Commonly known as:  LEVEMIR  Inject 48 Units into the skin every morning.     INVOKAMET [phone] MG Tabs  Generic drug:  Canagliflozin-Metformin HCl     levonorgestrel 20 MCG/24HR IUD  Commonly known as:  MIRENA  1 each by Intrauterine route once.     Liraglutide -Weight Management 18 MG/3ML Sopn  Commonly known as:  SAXENDA  Inject 3 mg into the skin daily. Inject 2.4 mg daily for 7 days, then increase dose to 3 mg daily     MULTIVITAMIN ADULT PO  Take by mouth.     ONE TOUCH ULTRA 2 W/DEVICE Kit  by Does not apply route.     vitamin C 1000 MG tablet  Take 1,000 mg by mouth daily.        Allergies: No Known Allergies  Past Medical History  Diagnosis Date  . Diabetes mellitus   . Back pain     No past surgical history on file.  Family History  Problem Relation Age of Onset  . Diabetes Father   . Diabetes Mother   . Fibromyalgia Mother     Social History:  reports that she has quit smoking. She has never used smokeless tobacco. She reports that she drinks alcohol. She reports that she does not use illicit drugs.    Review of Systems         Lipids: Her last lipid panel showed triglycerides of 382 and LDL of 76 with HDL 35, previously LDL was 129. She was supposed to start on Lipitor from PCP and has not had any follow-up       Lab Results  Component Value Date   CHOL 170 02/02/2015   HDL 37.40* 02/02/2015   LDLDIRECT 90.0 02/02/2015   TRIG 272.0* 02/02/2015   CHOLHDL 5 02/02/2015                         There is a history of Numbness,  tingling or burning in feet, sometimes pain and may take gabapentin as needed    Physical Examination:  BP 122/84 mmHg  Pulse 104  Temp(Src) 98.4 F (36.9 C) (Oral)  Resp 15  Wt 234 lb (106.142 kg)  SpO2 97%   ASSESSMENT/PLAN:  Diabetes type 2, uncontrolled with obesity and BMI 36 See history of present illness for current management, blood sugar patterns and  problems identified  Currently the patient is on Invokamet, basal insulin with LEVEMIR and Saxenda 3 mg Her blood sugar control is excellent but her main difficulties with not being able to be consistent with her diet She is also not exercising consistently Her postprandial readings are generally fairly well controlled except with dietary indiscretion and probably does need to check these more often also Currently not requiring mealtime coverage Most likely the improvement in her blood sugars recently is from using Saxenda instead of Victoza since no other changes were made in her regimen on the last visit  For now will recommend the following:  She can look into using the V-go pump although this may not change her status as being on insulin for her work  She can continue Saxenda 3 mg daily  Trial of Wellbutrin SR 150 mg daily to help her with dietary compliance, potentially weight loss and reduce her periods of depression that interfere with her dietary compliance.  Discussed how this would work and potential side effects, timing of the medication  More blood sugars after meals  More consistent exercise  Discussed potentially reducing the dose of Levemir if fasting blood sugars are consistently below 100  HYPERLIPIDEMIA: Her LDL is below 100 but her triglycerides are relatively high, she is a candidate for fenofibrate but will first see these are improved with weight loss, consistent diet and better diabetes control   Patient Instructions  Check blood sugars on waking up .[name]3-4  .[name] times a week Also check blood  sugars about 2 hours after a meal and do this after different meals by rotation  Recommended blood sugar levels on waking up is 90-130 and about 2 hours after meal is 140-180 Please bring blood sugar monitor to each visit.  Exercise regularly    Counseling time on subjects discussed above is over 50% of today's 25 minute visit  Latriece Anstine 02/05/2015, 3:59 PM   Note: This office note was prepared with Dragon voice recognition system technology. Any transcriptional errors that result from this process are unintentional.

## 2015-02-05 NOTE — Patient Instructions (Addendum)
Check blood sugars on waking up .[name]3-4  .[name] times a week Also check blood sugars about 2 hours after a meal and do this after different meals by rotation  Recommended blood sugar levels on waking up is 90-130 and about 2 hours after meal is 140-180 Please bring blood sugar monitor to each visit.  Exercise regularly

## 2015-02-08 ENCOUNTER — Encounter: Payer: Self-pay | Admitting: *Deleted

## 2015-02-12 ENCOUNTER — Telehealth: Payer: Self-pay | Admitting: Endocrinology

## 2015-02-12 NOTE — Telephone Encounter (Signed)
You just need to write a letter stating that she is still on insulin and unable to return to work until she is off it.

## 2015-02-12 NOTE — Telephone Encounter (Signed)
Patient called and would like to have Dr. Lucianne Muss extend her FMLA  She would like a letter written showing she is still on Insulin    Please advise    Thank you

## 2015-02-12 NOTE — Telephone Encounter (Signed)
Please see below and advise.

## 2015-02-12 NOTE — Telephone Encounter (Signed)
Need a copy of her previous FMLA.  What is a process for extending this?

## 2015-02-16 ENCOUNTER — Encounter: Payer: Self-pay | Admitting: Endocrinology

## 2015-02-16 NOTE — Telephone Encounter (Signed)
Completed.

## 2015-03-25 ENCOUNTER — Telehealth: Payer: Self-pay | Admitting: Endocrinology

## 2015-03-25 ENCOUNTER — Other Ambulatory Visit: Payer: Self-pay | Admitting: *Deleted

## 2015-03-25 MED ORDER — INVOKAMET 150-1000 MG PO TABS: ORAL_TABLET | ORAL | Status: DC

## 2015-03-25 NOTE — Telephone Encounter (Signed)
Patient called and would like a refill on her medication  Mrs. Loveall is currently out of town    Rx: Invokana   Pharmacy: [facility]  [phone]   Thank you

## 2015-03-25 NOTE — Telephone Encounter (Signed)
rx sent

## 2015-04-02 ENCOUNTER — Other Ambulatory Visit (INDEPENDENT_AMBULATORY_CARE_PROVIDER_SITE_OTHER): Payer: BLUE CROSS/BLUE SHIELD

## 2015-04-02 ENCOUNTER — Other Ambulatory Visit: Payer: BLUE CROSS/BLUE SHIELD

## 2015-04-02 DIAGNOSIS — E119 Type 2 diabetes mellitus without complications: Secondary | ICD-10-CM | POA: Diagnosis not present

## 2015-04-02 LAB — BASIC METABOLIC PANEL
BUN: 14 mg/dL (ref 6–23)
CALCIUM: 9.7 mg/dL (ref 8.4–10.5)
CO2: 27 mEq/L (ref 19–32)
CREATININE: 0.72 mg/dL (ref 0.40–1.20)
Chloride: 100 mEq/L (ref 96–112)
GFR: 91.37 mL/min (ref >60.00)
GLUCOSE: 156 mg/dL — AB (ref 70–99)
Potassium: 3.7 mEq/L (ref 3.5–5.1)
Sodium: 136 mEq/L (ref 135–145)

## 2015-04-02 LAB — HEMOGLOBIN A1C: Hgb A1c MFr Bld: 6.4 % (ref 4.6–6.5)

## 2015-04-07 ENCOUNTER — Ambulatory Visit (INDEPENDENT_AMBULATORY_CARE_PROVIDER_SITE_OTHER): Payer: BLUE CROSS/BLUE SHIELD | Admitting: Endocrinology

## 2015-04-07 ENCOUNTER — Encounter: Payer: Self-pay | Admitting: Endocrinology

## 2015-04-07 VITALS — BP 124/82 | HR 103 | Temp 98.1°F | Resp 14 | Ht 68.0 in | Wt 233.6 lb

## 2015-04-07 DIAGNOSIS — E119 Type 2 diabetes mellitus without complications: Secondary | ICD-10-CM

## 2015-04-07 DIAGNOSIS — E781 Pure hyperglyceridemia: Secondary | ICD-10-CM | POA: Diagnosis not present

## 2015-04-07 NOTE — Patient Instructions (Signed)
Check blood sugars on waking up .. 3 .. times a week Also check blood sugars about 2 hours after a meal and do this after different meals by rotation Recommended blood sugar levels on waking up is 90-130 and about 2 hours after meal is 140-180 Please bring blood sugar monitor to each visit.  Walk or swim  Levemir 45 units daily

## 2015-04-07 NOTE — Progress Notes (Signed)
Patient ID: Pamela Shannon, female   DOB: September 07, 1965, 49 y.o.   MRN: [id number]           Reason for Appointment: follow-up for Type 2 Diabetes  Referring physician: Mellody Drown  History of Present Illness:          Diagnosis: Type 2 diabetes mellitus, date of diagnosis: ?  2011       Past history:  At diagnosis she was having symptoms of marked fatigue, dry mouth but not clear how high her sugars were. She has been on various medications for treating her diabetes, probably metformin to start with. Also around the time of diagnosis she thinks she weighed about 300 pounds. She thinks she was able to lose weight subsequently and about 2 years ago had lost down to nearly 200 pounds All the details are not available of her previous management but she appears to have had progressively worsening requiring multiple agents and additional therapies. She thinks her A1c has been over 9% for about 2 years or more, lab records are not available She probably had been on metformin along with Tanzeum about a year or so ago. Because of inadequate control she was also given Invokamet at some point.  In 9/15 she was switched from Tanzeum to [place] and started on bedtime Levemir insulin, 20 units  Recent history:  INSULIN regimen: Levemir 48 units in the morning  Her A1c is now down to normal at 6.4% She does better with her Levemir in the morning as blood sugars are better controlled in the afternoon with this Also her Victoza was changed to [place] on her visit in 4/16 However she has not lost any significant amount of weight No side effects with taking Invokamet twice a day Her blood sugars are generally fairly good although she thinks they are sometimes higher after evening meal based on her intake  Current blood sugar patterns and problems identified:  She did not bring her monitor for download today  Her A1c is excellent even though she probably has some high postprandial readings; she does not check  readings after lunch or breakfast usually  She said that because she is not able to work she is sometimes getting depressed and tends to overeat  She has not been able to lose any weight despite switching from Victoza to [place]   fasting readings are near normal again  She has not been motivated to start a regular exercise program  She is overall trying to improve her choices for what she is eating but this is not consistent; she probably is doing better with starting Wellbutrin        Hypoglycemic drugs the patient is taking are: Invokamet 150/1000 twice a day, Saxenda 41m daily    Side effects from medications have been: None Compliance with the medical regimen: Fair but improving   Glucose monitoring:  done 1.8 times a day         Glucometer: One Touch.      Blood Glucose readings by time of day     PRE-MEAL Fasting Lunch Dinner Bedtime Overall  Glucose range: 120-130   160-214   Mean/median:           Self-care: The diet that the patient has been following is: None    Meals: 3 meals per day. Breakfast is sausage biscuit or oatmeal, sometimes cereal.   Evening meal is 6-7 PM. Has snacks with peanut butter crackers   Usually has unsweetened drinks  Exercise:  Walking  or swim, less        Dietician visit, most recent: 11/19/14                Weight history: 200-260  Wt Readings from Last 3 Encounters:  04/07/15 233 lb 9.6 oz (105.96 kg)  02/05/15 234 lb (106.142 kg)  12/07/14 233 lb (105.688 kg)   Glycemic control:2/16 her A1c was 9.7%    Lab Results  Component Value Date   HGBA1C 6.4 04/02/2015   HGBA1C 6.7* 02/02/2015   Lab Results  Component Value Date   MICROALBUR 4.9* 12/02/2014   CREATININE 0.72 04/02/2015    Appointment on 04/02/2015  Component Date Value Ref Range Status  . Hgb A1c MFr Bld 04/02/2015 6.4  4.6 - 6.5 % Final   Glycemic Control Guidelines for People with Diabetes:Non Diabetic:  <6%Goal of Therapy: <7%Additional Action Suggested:  >8%     . Sodium 04/02/2015 136  135 - 145 mEq/L Final  . Potassium 04/02/2015 3.7  3.5 - 5.1 mEq/L Final  . Chloride 04/02/2015 100  96 - 112 mEq/L Final  . CO2 04/02/2015 27  19 - 32 mEq/L Final  . Glucose, Bld 04/02/2015 156* 70 - 99 mg/dL Final  . BUN 04/02/2015 14  6 - 23 mg/dL Final  . Creatinine, Ser 04/02/2015 0.72  0.40 - 1.20 mg/dL Final  . Calcium 04/02/2015 9.7  8.4 - 10.5 mg/dL Final  . GFR 04/02/2015 91.37  >60.00 mL/min Final        Medication List       This list is accurate as of: 04/07/15 11:59 PM.  Always use your most recent med list.               ALEVE 220 MG Caps  Generic drug:  Naproxen Sodium  Take 1 capsule by mouth as needed.     buPROPion 150 MG 12 hr tablet  Commonly known as:  WELLBUTRIN SR  Take 1 tablet (150 mg total) by mouth daily.     Insulin Detemir 100 UNIT/ML Pen  Commonly known as:  LEVEMIR  Inject 48 Units into the skin every morning.     INVOKAMET [phone] MG Tabs  Generic drug:  Canagliflozin-Metformin HCl  Take 1 capsule 2 times a day     levonorgestrel 20 MCG/24HR IUD  Commonly known as:  MIRENA  1 each by Intrauterine route once.     Liraglutide -Weight Management 18 MG/3ML Sopn  Commonly known as:  SAXENDA  Inject 3 mg into the skin daily. Inject 2.4 mg daily for 7 days, then increase dose to 3 mg daily     MULTIVITAMIN ADULT PO  Take by mouth.     ONE TOUCH ULTRA 2 W/DEVICE Kit  by Does not apply route.     vitamin C 1000 MG tablet  Take 1,000 mg by mouth daily.        Allergies: No Known Allergies  Past Medical History  Diagnosis Date  . Diabetes mellitus   . Back pain     No past surgical history on file.  Family History  Problem Relation Age of Onset  . Diabetes Father   . Diabetes Mother   . Fibromyalgia Mother     Social History:  reports that she has quit smoking. She has never used smokeless tobacco. She reports that she drinks alcohol. She reports that she does not use illicit drugs.     Review of Systems     She feels overall somewhat  better and has better control of her appetite with starting Wellbutrin       Lipids: Her last lipid panel showed triglycerides of 382 and LDL of 76 with HDL 35, previously LDL was 129.         Lab Results  Component Value Date   CHOL 170 02/02/2015   HDL 37.40* 02/02/2015   LDLDIRECT 90.0 02/02/2015   TRIG 272.0* 02/02/2015   CHOLHDL 5 02/02/2015                       There is a history of Numbness, tingling or burning in feet, sometimes pain and may take gabapentin as needed    Physical Examination:  BP 124/82 mmHg  Pulse 103  Temp(Src) 98.1 F (36.7 C)  Resp 14  Ht 5' 8" (1.727 m)  Wt 233 lb 9.6 oz (105.96 kg)  BMI 35.53 kg/m2  SpO2 96%   ASSESSMENT/PLAN:  Diabetes type 2, uncontrolled with obesity and BMI 36 See history of present illness for current management, blood sugar patterns and problems identified  Currently the patient is on Invokamet, basal insulin with LEVEMIR and Saxenda 3 mg Her blood sugar control is excellent but she has difficulty losing weight She is also not exercising as directed and has not been motivated Also unable to review her home readings today since she did not bring her monitor   For now will recommend the following:  She can continue Saxenda 3 mg daily since this likely has helped her sugar control  Continue Wellbutrin SR 150 mg daily to help her with dietary compliance  More blood sugars after meals  Start consistent exercise with walking or swimming  Reduce insulin to 45 and she may be able to reduce it further if she is exercising regularly  Discussed potentially reducing the dose of Levemir if fasting blood sugars are consistently below 100    Patient Instructions  Check blood sugars on waking up .. 3 .. times a week Also check blood sugars about 2 hours after a meal and do this after different meals by rotation Recommended blood sugar levels on waking up is 90-130  and about 2 hours after meal is 140-180 Please bring blood sugar monitor to each visit.  Walk or swim  Levemir 45 units daily     Jocee Kissick 04/08/2015, 9:55 AM   Note: This office note was prepared with Estate agent. Any transcriptional errors that result from this process are unintentional.

## 2015-05-24 ENCOUNTER — Telehealth: Payer: Self-pay | Admitting: Endocrinology

## 2015-05-24 ENCOUNTER — Other Ambulatory Visit: Payer: Self-pay | Admitting: *Deleted

## 2015-05-24 MED ORDER — BUPROPION HCL ER (SR) 150 MG PO TB12: 150.0000 mg | ORAL_TABLET | Freq: Every day | ORAL | Status: DC

## 2015-05-24 MED ORDER — LIRAGLUTIDE -WEIGHT MANAGEMENT 18 MG/3ML ~~LOC~~ SOPN: 3.0000 mg | PEN_INJECTOR | Freq: Every day | SUBCUTANEOUS | Status: DC

## 2015-05-24 NOTE — Telephone Encounter (Signed)
Patient need refill oLiraglutide -Weight Management (SAXENDA) 18 MG/3ML SOPN, send to,   [facility] - [place], [place] - [address] [phone] (Phone) [phone] (Fax)

## 2015-05-24 NOTE — Telephone Encounter (Signed)
Please call into [facility] on [address] the refill for bupropion

## 2015-05-24 NOTE — Telephone Encounter (Signed)
rx sent

## 2015-05-27 ENCOUNTER — Other Ambulatory Visit: Payer: Self-pay | Admitting: *Deleted

## 2015-05-27 MED ORDER — LIRAGLUTIDE -WEIGHT MANAGEMENT 18 MG/3ML ~~LOC~~ SOPN: 3.0000 mg | PEN_INJECTOR | Freq: Every day | SUBCUTANEOUS | Status: DC

## 2015-07-04 ENCOUNTER — Other Ambulatory Visit: Payer: Self-pay | Admitting: Endocrinology

## 2015-07-05 ENCOUNTER — Other Ambulatory Visit (INDEPENDENT_AMBULATORY_CARE_PROVIDER_SITE_OTHER): Payer: BLUE CROSS/BLUE SHIELD

## 2015-07-05 ENCOUNTER — Telehealth: Payer: Self-pay | Admitting: Endocrinology

## 2015-07-05 DIAGNOSIS — E781 Pure hyperglyceridemia: Secondary | ICD-10-CM

## 2015-07-05 DIAGNOSIS — E119 Type 2 diabetes mellitus without complications: Secondary | ICD-10-CM | POA: Diagnosis not present

## 2015-07-05 LAB — COMPREHENSIVE METABOLIC PANEL
ALT: 43 U/L — ABNORMAL HIGH (ref 0–35)
AST: 33 U/L (ref 0–37)
Albumin: 4.4 g/dL (ref 3.5–5.2)
Alkaline Phosphatase: 60 U/L (ref 39–117)
BILIRUBIN TOTAL: 0.7 mg/dL (ref 0.2–1.2)
BUN: 14 mg/dL (ref 6–23)
CALCIUM: 9.8 mg/dL (ref 8.4–10.5)
CHLORIDE: 100 meq/L (ref 96–112)
CO2: 30 meq/L (ref 19–32)
CREATININE: 0.67 mg/dL (ref 0.40–1.20)
GFR: 99.18 mL/min (ref >60.00)
GLUCOSE: 133 mg/dL — AB (ref 70–99)
Potassium: 3.9 mEq/L (ref 3.5–5.1)
Sodium: 138 mEq/L (ref 135–145)
Total Protein: 7.6 g/dL (ref 6.0–8.3)

## 2015-07-05 LAB — LIPID PANEL
CHOL/HDL RATIO: 4
Cholesterol: 156 mg/dL (ref 0–200)
HDL: 36.5 mg/dL — ABNORMAL LOW (ref >39.00)
LDL CALC: 93 mg/dL (ref 0–99)
NONHDL: 119.93
Triglycerides: 136 mg/dL (ref 0.0–149.0)
VLDL: 27.2 mg/dL (ref 0.0–40.0)

## 2015-07-05 LAB — HEMOGLOBIN A1C: Hgb A1c MFr Bld: 6.7 % — ABNORMAL HIGH (ref 4.6–6.5)

## 2015-07-05 NOTE — Telephone Encounter (Signed)
Please call in levemir pen to [facility] on [address]

## 2015-07-05 NOTE — Telephone Encounter (Signed)
That was sent in this morning. 

## 2015-07-08 ENCOUNTER — Ambulatory Visit: Payer: BLUE CROSS/BLUE SHIELD | Admitting: Endocrinology

## 2015-07-12 ENCOUNTER — Encounter: Payer: Self-pay | Admitting: Endocrinology

## 2015-07-12 ENCOUNTER — Ambulatory Visit (INDEPENDENT_AMBULATORY_CARE_PROVIDER_SITE_OTHER): Payer: BLUE CROSS/BLUE SHIELD | Admitting: Endocrinology

## 2015-07-12 VITALS — BP 118/82 | HR 94 | Temp 97.8°F | Resp 14 | Ht 68.0 in | Wt 225.0 lb

## 2015-07-12 DIAGNOSIS — E781 Pure hyperglyceridemia: Secondary | ICD-10-CM | POA: Diagnosis not present

## 2015-07-12 DIAGNOSIS — Z23 Encounter for immunization: Secondary | ICD-10-CM | POA: Diagnosis not present

## 2015-07-12 DIAGNOSIS — E1165 Type 2 diabetes mellitus with hyperglycemia: Secondary | ICD-10-CM | POA: Diagnosis not present

## 2015-07-12 DIAGNOSIS — E669 Obesity, unspecified: Secondary | ICD-10-CM | POA: Diagnosis not present

## 2015-07-12 DIAGNOSIS — G2581 Restless legs syndrome: Secondary | ICD-10-CM

## 2015-07-12 DIAGNOSIS — Z794 Long term (current) use of insulin: Secondary | ICD-10-CM

## 2015-07-12 MED ORDER — GABAPENTIN 300 MG PO CAPS: 300.0000 mg | ORAL_CAPSULE | Freq: Every day | ORAL | Status: DC

## 2015-07-12 MED ORDER — BUPROPION HCL ER (SR) 150 MG PO TB12: 150.0000 mg | ORAL_TABLET | Freq: Every day | ORAL | Status: DC

## 2015-07-12 NOTE — Progress Notes (Signed)
Patient ID: Pamela Shannon, female   DOB: 1966-04-23, 49 y.o.   MRN: [id number]           Reason for Appointment: follow-up for Type 2 Diabetes  Referring physician: Mellody Drown  History of Present Illness:          Diagnosis: Type 2 diabetes mellitus, date of diagnosis: ?  2011       Past history:  At diagnosis she was having symptoms of marked fatigue, dry mouth but not clear how high her sugars were. She has been on various medications for treating her diabetes, probably metformin to start with. Also around the time of diagnosis she thinks she weighed about 300 pounds. She thinks she was able to lose weight subsequently and about 2 years ago had lost down to nearly 200 pounds All the details are not available of her previous management but she appears to have had progressively worsening requiring multiple agents and additional therapies. She thinks her A1c has been over 9% for about 2 years or more, lab records are not available She probably had been on metformin along with Tanzeum about a year or so ago. Because of inadequate control she was also given Invokamet at some point.  In 9/15 she was switched from Tanzeum to [place] and started on bedtime Levemir insulin, 20 units  Recent history:  INSULIN regimen: Levemir 45 units in the morning  Her A1c is now slightly higher than before at 6.7%, however much better than what she had in 09/2014 She is now working again and wakes up at 2:30 AM and has dinner at 4 PM Her Levemir was reduced by 3 units on the last visit because of mild hypoglycemia Also her Victoza was changed to [place] on her visit in 4/16 and she is continuing this  Current blood sugar patterns and problems identified:  Her blood sugars are not being checked fasting consistently and they maybe occasionally higher  She is checking her blood sugars mostly before supper on these are near-normal recently  Does have occasional blood sugars after evening meals and these are high  at times  She thinks that initially when starting her work because of her new schedule sugars were higher  She is not motivated to exercise despite reminders  She is overall trying to improve her choices for what she is eating and is finally losing some weight        Hypoglycemic drugs the patient is taking are: Invokamet 150/1000 twice a day, Saxenda 24m daily    Side effects from medications have been: None Compliance with the medical regimen: Fair but improving   Glucose monitoring:  done 1.8 times a day         Glucometer: One Touch.      Blood Glucose readings by time of day    Mean values apply above for all meters except median for One Touch  PRE-MEAL Fasting Lunch Dinner  PC  Overall  Glucose range:  105-147    66-153   129-261    Mean/median:    118    136      Self-care: The diet that the patient has been following is: None    Meals: 3 meals per day, evening meal at 4 PM. Breakfast is sausage biscuit or oatmeal, sometimes cereal.   Has snacks with peanut butter crackers   Usually has unsweetened drinks  Exercise: none       Dietician visit, most recent: 11/19/14  Weight history: 200-260  Wt Readings from Last 3 Encounters:  07/12/15 225 lb (102.059 kg)  04/07/15 233 lb 9.6 oz (105.96 kg)  02/05/15 234 lb (106.142 kg)   Glycemic control:2/16 her A1c was 9.7%    Lab Results  Component Value Date   HGBA1C 6.7* 07/05/2015   HGBA1C 6.4 04/02/2015   HGBA1C 6.7* 02/02/2015   Lab Results  Component Value Date   MICROALBUR 4.9* 12/02/2014   LDLCALC 93 07/05/2015   CREATININE 0.67 07/05/2015    No visits with results within 1 Week(s) from this visit. Latest known visit with results is:  Appointment on 07/05/2015  Component Date Value Ref Range Status  . Hgb A1c MFr Bld 07/05/2015 6.7* 4.6 - 6.5 % Final   Glycemic Control Guidelines for People with Diabetes:Non Diabetic:  <6%Goal of Therapy: <7%Additional Action Suggested:  >8%   . Sodium  07/05/2015 138  135 - 145 mEq/L Final  . Potassium 07/05/2015 3.9  3.5 - 5.1 mEq/L Final  . Chloride 07/05/2015 100  96 - 112 mEq/L Final  . CO2 07/05/2015 30  19 - 32 mEq/L Final  . Glucose, Bld 07/05/2015 133* 70 - 99 mg/dL Final  . BUN 07/05/2015 14  6 - 23 mg/dL Final  . Creatinine, Ser 07/05/2015 0.67  0.40 - 1.20 mg/dL Final  . Total Bilirubin 07/05/2015 0.7  0.2 - 1.2 mg/dL Final  . Alkaline Phosphatase 07/05/2015 60  39 - 117 U/L Final  . AST 07/05/2015 33  0 - 37 U/L Final  . ALT 07/05/2015 43* 0 - 35 U/L Final  . Total Protein 07/05/2015 7.6  6.0 - 8.3 g/dL Final  . Albumin 07/05/2015 4.4  3.5 - 5.2 g/dL Final  . Calcium 07/05/2015 9.8  8.4 - 10.5 mg/dL Final  . GFR 07/05/2015 99.18  >60.00 mL/min Final  . Cholesterol 07/05/2015 156  0 - 200 mg/dL Final   ATP III Classification       Desirable:  < 200 mg/dL               Borderline High:  200 - 239 mg/dL          High:  > = 240 mg/dL  . Triglycerides 07/05/2015 136.0  0.0 - 149.0 mg/dL Final   Normal:  <150 mg/dLBorderline High:  150 - 199 mg/dL  . HDL 07/05/2015 36.50* >39.00 mg/dL Final  . VLDL 07/05/2015 27.2  0.0 - 40.0 mg/dL Final  . LDL Cholesterol 07/05/2015 93  0 - 99 mg/dL Final  . Total CHOL/HDL Ratio 07/05/2015 4   Final                  Men          Women1/2 Average Risk     3.4          3.3Average Risk          5.0          4.42X Average Risk          9.6          7.13X Average Risk          15.0          11.0                      . NonHDL 07/05/2015 119.93   Final   NOTE:  Non-HDL goal should be 30 mg/dL higher than patient's LDL goal (i.e. LDL goal of <  70 mg/dL, would have non-HDL goal of < 100 mg/dL)        Medication List       This list is accurate as of: 07/12/15 11:59 PM.  Always use your most recent med list.               ALEVE 220 MG Caps  Generic drug:  Naproxen Sodium  Take 1 capsule by mouth as needed.     buPROPion 150 MG 12 hr tablet  Commonly known as:  WELLBUTRIN SR  Take 1 tablet  (150 mg total) by mouth daily.     gabapentin 300 MG capsule  Commonly known as:  NEURONTIN  Take 1 capsule (300 mg total) by mouth at bedtime.     INVOKAMET [phone] MG Tabs  Generic drug:  Canagliflozin-Metformin HCl  Take 1 capsule 2 times a day     LEVEMIR FLEXTOUCH 100 UNIT/ML Pen  Generic drug:  Insulin Detemir  INJECT 48 UNITS SUBCUTANEOUSLY ONCE DAILY IN THE MORNING     levonorgestrel 20 MCG/24HR IUD  Commonly known as:  MIRENA  1 each by Intrauterine route once.     Liraglutide -Weight Management 18 MG/3ML Sopn  Commonly known as:  SAXENDA  Inject 3 mg into the skin daily. Inject 3 mg daily     MULTIVITAMIN ADULT PO  Take by mouth.     ONE TOUCH ULTRA 2 W/DEVICE Kit  by Does not apply route.     vitamin C 1000 MG tablet  Take 1,000 mg by mouth daily.        Allergies: No Known Allergies  Past Medical History  Diagnosis Date  . Diabetes mellitus   . Back pain     No past surgical history on file.  Family History  Problem Relation Age of Onset  . Diabetes Father   . Diabetes Mother   . Fibromyalgia Mother     Social History:  reports that she has quit smoking. She has never used smokeless tobacco. She reports that she drinks alcohol. She reports that she does not use illicit drugs.    Review of Systems     She was on Wellbutrin both for improving her appetite and mood but she ran out of this prescription and does not think she feels any worse       Lipids: Her previous lipid panel showed triglycerides of 382 and LDL of 76 with HDL 35, previously LDL was 129.  Now with some weight loss triglycerides are significantly better      Lab Results  Component Value Date   CHOL 156 07/05/2015   HDL 36.50* 07/05/2015   LDLCALC 93 07/05/2015   LDLDIRECT 90.0 02/02/2015   TRIG 136.0 07/05/2015   CHOLHDL 4 07/05/2015                       There is a history of Numbness, tingling or rarely burning in feet.  The symptoms are better now  She may take  gabapentin as needed for restless legs at night and is asking for prescription, previously given by PCP    Physical Examination:  BP 118/82 mmHg  Pulse 94  Temp(Src) 97.8 F (36.6 C)  Resp 14  Ht '5\' 8"'  (1.727 m)  Wt 225 lb (102.059 kg)  BMI 34.22 kg/m2  SpO2 97%   ASSESSMENT/PLAN:  Diabetes type 2, uncontrolled with obesity and BMI 36 See history of present illness for current management, blood sugar patterns and  problems identified  Currently the patient is on Invokamet, basal insulin with LEVEMIR once a day and Saxenda 3 mg Her blood sugar control is fairly good but is checking blood sugars mostly before her evening meal now She has some postprandial readings and these may be occasionally higher but has only about 3 readings over 180 recently She has lost some weight since her last visit, mostly because of being more consistent with diet and starting work She is also not exercising as directed and has not been motivated to start   For now will recommend the following:  She can continue Saxenda 3 mg daily since this helps with postprandial control  No change in insulin  More readings after meals especially evening meal and on her days off after other meals  Restart Wellbutrin SR 150 mg daily to help her with dietary compliance and motivation  Start consistent exercise with walking or indoor exercises  Restless legs: She can restart gabapentin as needed to take at bedtime which helps  DYSLIPIDEMIA: Her triglycerides are apparently better with improved his control, some weight loss and improve diet. However discussed that she still has a low HDL which will improve with weight loss and exercise   Patient Instructions  Walk daily!  Check blood sugars on waking up 3  times a week Also check blood sugars about 2 hours after a meal and do this after different meals by rotation  Recommended blood sugar levels on waking up is 90-130 and about 2 hours after meal is  130-160  Please bring your blood sugar monitor to each visit, thank you    Counseling time on subjects discussed above is over 50% of today's 25 minute visit   Rody Keadle 07/13/2015, 3:54 PM   Note: This office note was prepared with Dragon voice recognition system technology. Any transcriptional errors that result from this process are unintentional.

## 2015-07-12 NOTE — Patient Instructions (Signed)
Walk daily  Check blood sugars on waking up 3  times a week Also check blood sugars about 2 hours after a meal and do this after different meals by rotation  Recommended blood sugar levels on waking up is 90-130 and about 2 hours after meal is 130-160  Please bring your blood sugar monitor to each visit, thank you  

## 2015-07-13 DIAGNOSIS — G2581 Restless legs syndrome: Secondary | ICD-10-CM | POA: Insufficient documentation

## 2015-07-18 ENCOUNTER — Other Ambulatory Visit: Payer: Self-pay | Admitting: Endocrinology

## 2015-07-26 ENCOUNTER — Telehealth: Payer: Self-pay | Admitting: Endocrinology

## 2015-07-26 NOTE — Telephone Encounter (Signed)
Patient would like to know is her paper work that she dropped off is ready to be picked up, please advise

## 2015-07-26 NOTE — Telephone Encounter (Signed)
Called patient to let her know her papers are ready, she will pick up today.

## 2015-09-01 ENCOUNTER — Other Ambulatory Visit: Payer: Self-pay | Admitting: Endocrinology

## 2015-10-20 ENCOUNTER — Other Ambulatory Visit: Payer: BLUE CROSS/BLUE SHIELD

## 2015-10-25 ENCOUNTER — Ambulatory Visit: Payer: BLUE CROSS/BLUE SHIELD | Admitting: Endocrinology

## 2015-10-29 ENCOUNTER — Other Ambulatory Visit (INDEPENDENT_AMBULATORY_CARE_PROVIDER_SITE_OTHER): Payer: BLUE CROSS/BLUE SHIELD

## 2015-10-29 DIAGNOSIS — Z794 Long term (current) use of insulin: Secondary | ICD-10-CM | POA: Diagnosis not present

## 2015-10-29 DIAGNOSIS — E1165 Type 2 diabetes mellitus with hyperglycemia: Secondary | ICD-10-CM | POA: Diagnosis not present

## 2015-10-29 LAB — BASIC METABOLIC PANEL
BUN: 16 mg/dL (ref 6–23)
CALCIUM: 9.9 mg/dL (ref 8.4–10.5)
CHLORIDE: 100 meq/L (ref 96–112)
CO2: 29 meq/L (ref 19–32)
Creatinine, Ser: 0.62 mg/dL (ref 0.40–1.20)
GFR: 108.32 mL/min (ref >60.00)
Glucose, Bld: 118 mg/dL — ABNORMAL HIGH (ref 70–99)
POTASSIUM: 4 meq/L (ref 3.5–5.1)
SODIUM: 136 meq/L (ref 135–145)

## 2015-10-29 LAB — MICROALBUMIN / CREATININE URINE RATIO
CREATININE, U: 63.6 mg/dL
MICROALB/CREAT RATIO: 8.2 mg/g (ref 0.0–30.0)
Microalb, Ur: 5.2 mg/dL — ABNORMAL HIGH (ref 0.0–1.9)

## 2015-10-29 LAB — HEMOGLOBIN A1C: Hgb A1c MFr Bld: 6.1 % (ref 4.6–6.5)

## 2015-10-30 ENCOUNTER — Other Ambulatory Visit: Payer: Self-pay | Admitting: Endocrinology

## 2015-11-02 ENCOUNTER — Other Ambulatory Visit: Payer: Self-pay | Admitting: *Deleted

## 2015-11-02 ENCOUNTER — Encounter: Payer: Self-pay | Admitting: Endocrinology

## 2015-11-02 ENCOUNTER — Ambulatory Visit (INDEPENDENT_AMBULATORY_CARE_PROVIDER_SITE_OTHER): Payer: BLUE CROSS/BLUE SHIELD | Admitting: Endocrinology

## 2015-11-02 VITALS — BP 118/80 | HR 100 | Temp 97.9°F | Resp 16 | Ht 68.0 in | Wt 229.2 lb

## 2015-11-02 DIAGNOSIS — E119 Type 2 diabetes mellitus without complications: Secondary | ICD-10-CM | POA: Diagnosis not present

## 2015-11-02 MED ORDER — INSULIN DETEMIR 100 UNIT/ML FLEXPEN: PEN_INJECTOR | SUBCUTANEOUS | Status: DC

## 2015-11-02 NOTE — Progress Notes (Signed)
Patient ID: Pamela Shannon, female   DOB: 12/14/1965, 49 y.o.   MRN: [id number]           Reason for Appointment: follow-up for Type 2 Diabetes  Referring physician: [place]  History of Present Illness:          Diagnosis: Type 2 diabetes mellitus, date of diagnosis: ?  2011       Past history:  At diagnosis she was having symptoms of marked fatigue, dry mouth but not clear how high her sugars were. She has been on various medications for treating her diabetes, probably metformin to start with. Also around the time of diagnosis she thinks she weighed about 300 pounds. She thinks she was able to lose weight subsequently and about 2 years ago had lost down to nearly 200 pounds All the details are not available of her previous management but she appears to have had progressively worsening requiring multiple agents and additional therapies. She thinks her A1c has been over 9% for about 2 years or more, lab records are not available She probably had been on metformin along with Tanzeum about a year or so ago. Because of inadequate control she was also given Invokamet at some point.  In 9/15 she was switched from Tanzeum to Victoza and started on bedtime Levemir insulin, 20 units  Recent history:  INSULIN regimen: Levemir 45 units in the morning  Her A1c is now down to 6.1 which is about the best she has had Her insulin has not been changed  Previously her Victoza was changed to Saxenda on her visit in 4/16 and she is continuing this  Current management, blood sugar patterns and problems identified:  Her blood sugars are not being checked fasting although it was excellent in the lab  She has checked blood sugars at various times of the day after meals especially before she is starting to drive at work and 70 evening  She does have sporadic high readings of 180-190 after meals based on her carbohydrate intake  More recently she has had some sweets like cookies causing higher sugars  She  is trying to eat small meals with a half have sandwich and salad for most of the meals and usually tries to eat less in the evenings  She thinks she is benefiting from taking Wellbutrin also along with her Saxenda but has not lost any weight; she thinks her waist size is likely better however  She has been trying to exercise and walk fairly regularly and is now more motivated.  No hypoglycemia, lowest recent blood sugar is only 93 in the afternoon        Hypoglycemic drugs the patient is taking are: Invokamet 150/1000 twice a day, Saxenda 3mg daily    Side effects from medications have been: None Compliance with the medical regimen: Fair but improving   Glucose monitoring:  done 1.8 times a day         Glucometer: One Touch.      Blood Glucose readings by time of day    Mean values apply above for all meters except median for One Touch  PRE-MEAL Fasting Lunch Dinner Bedtime Overall  Glucose range:  157       Mean/median:        POST-MEAL PC Breakfast PC Lunch PC Dinner  Glucose range:  128-186     Mean/median:        Mean values apply above for all meters except median for One Touch  PRE-MEAL Fasting   Lunch Dinner  PC  Overall  Glucose range:  105-147    66-153   129-261    Mean/median:    118    136      Self-care: The diet that the patient has been following is: None    Meals: 3 meals per day, evening meal at 5 PM. Breakfast is protein shake, toast or oatmeal, sometimes cereal.   Has snacks with peanut butter crackers   Usually has unsweetened drinks  Exercise: 3 miles, 3/7        Dietician visit, most recent: 11/19/14                Weight history: 200-260  Wt Readings from Last 3 Encounters:  11/02/15 229 lb 3.2 oz (103.964 kg)  07/12/15 225 lb (102.059 kg)  04/07/15 233 lb 9.6 oz (105.96 kg)   Glycemic control:2/16 her A1c was 9.7%    Lab Results  Component Value Date   HGBA1C 6.1 10/29/2015   HGBA1C 6.7* 07/05/2015   HGBA1C 6.4 04/02/2015   Lab Results    Component Value Date   MICROALBUR 5.2* 10/29/2015   LDLCALC 93 07/05/2015   CREATININE 0.62 10/29/2015    Lab on 10/29/2015  Component Date Value Ref Range Status  . Hgb A1c MFr Bld 10/29/2015 6.1  4.6 - 6.5 % Final   Glycemic Control Guidelines for People with Diabetes:Non Diabetic:  <6%Goal of Therapy: <7%Additional Action Suggested:  >8%   . Sodium 10/29/2015 136  135 - 145 mEq/L Final  . Potassium 10/29/2015 4.0  3.5 - 5.1 mEq/L Final  . Chloride 10/29/2015 100  96 - 112 mEq/L Final  . CO2 10/29/2015 29  19 - 32 mEq/L Final  . Glucose, Bld 10/29/2015 118* 70 - 99 mg/dL Final  . BUN 10/29/2015 16  6 - 23 mg/dL Final  . Creatinine, Ser 10/29/2015 0.62  0.40 - 1.20 mg/dL Final  . Calcium 10/29/2015 9.9  8.4 - 10.5 mg/dL Final  . GFR 10/29/2015 108.32  >60.00 mL/min Final  . Microalb, Ur 10/29/2015 5.2* 0.0 - 1.9 mg/dL Final  . Creatinine,U 10/29/2015 63.6   Final  . Microalb Creat Ratio 10/29/2015 8.2  0.0 - 30.0 mg/g Final        Medication List       This list is accurate as of: 11/02/15 11:39 AM.  Always use your most recent med list.               ALEVE 220 MG Caps  Generic drug:  Naproxen Sodium  Take 1 capsule by mouth as needed.     buPROPion 150 MG 12 hr tablet  Commonly known as:  WELLBUTRIN SR  Take 1 tablet (150 mg total) by mouth daily.     gabapentin 300 MG capsule  Commonly known as:  NEURONTIN  Take 1 capsule (300 mg total) by mouth at bedtime.     Insulin Detemir 100 UNIT/ML Pen  Commonly known as:  LEVEMIR FLEXTOUCH  INJECT 45 UNITS SUBCUTANEOUSLY ONCE DAILY IN THE MORNING     INVOKAMET [phone] MG Tabs  Generic drug:  Canagliflozin-Metformin HCl  TAKE ONE TABLET BY MOUTH TWICE DAILY     levonorgestrel 20 MCG/24HR IUD  Commonly known as:  MIRENA  1 each by Intrauterine route once.     MULTIVITAMIN ADULT PO  Take by mouth.     ONE TOUCH ULTRA 2 w/Device Kit  by Does not apply route.     SAXENDA 18 MG/3ML Sopn  Generic drug:   Liraglutide -Weight Management  INJECT 3 MG INTO THE SKIN DAILY     vitamin C 1000 MG tablet  Take 1,000 mg by mouth daily.        Allergies: No Known Allergies  Past Medical History  Diagnosis Date  . Diabetes mellitus   . Back pain     No past surgical history on file.  Family History  Problem Relation Age of Onset  . Diabetes Father   . Diabetes Mother   . Fibromyalgia Mother     Social History:  reports that she has quit smoking. She has never used smokeless tobacco. She reports that she drinks alcohol. She reports that she does not use illicit drugs.    Review of Systems     She was on Wellbutrin 150 mg SR both for improving her appetite and mood        Lipids: Her previous lipid panel showed triglycerides of 382 and LDL of 76 with HDL 35, previously LDL was 129.  Now  triglycerides are significantly better      Lab Results  Component Value Date   CHOL 156 07/05/2015   HDL 36.50* 07/05/2015   LDLCALC 93 07/05/2015   LDLDIRECT 90.0 02/02/2015   TRIG 136.0 07/05/2015   CHOLHDL 4 07/05/2015                       There is a history of Numbness, tingling or rarely burning in feet.  The symptoms are rare now  She may take gabapentin as needed for restless legs at night occasionally  She has not gone back to her PCP and has not had a recent mammogram  Her eye exam report is not available on file   Physical Examination:  BP 118/80 mmHg  Pulse 100  Temp(Src) 97.9 F (36.6 C)  Resp 16  Ht 5' 8" (1.727 m)  Wt 229 lb 3.2 oz (103.964 kg)  BMI 34.86 kg/m2  SpO2 97%   ASSESSMENT/PLAN:  Diabetes type 2, uncontrolled with obesity and BMI 36 See history of present illness for current management, blood sugar patterns and problems identified  Currently the patient is on Invokamet, basal insulin with LEVEMIR once a day and Saxenda 3 mg Her blood sugar control is  good with A1c 6.1 although lower than expected for her recent average blood sugar of 150,:  This is mostly with doing readings in between meals Fasting reading was fairly good in the lab She probably has not been consistent in cutting back on sweets in the last few days and will try to improve on this She does exercise now and is more motivated Will continue her regimen unchanged   Patient Instructions  Stay on track     [facility] 11/02/2015, 11:39 AM   Note: This office note was prepared with Dragon voice recognition system technology. Any transcriptional errors that result from this process are unintentional.

## 2015-11-02 NOTE — Patient Instructions (Signed)
Stay on track

## 2015-11-03 ENCOUNTER — Encounter: Payer: Self-pay | Admitting: Endocrinology

## 2015-11-06 ENCOUNTER — Other Ambulatory Visit: Payer: Self-pay | Admitting: Endocrinology

## 2015-11-09 ENCOUNTER — Other Ambulatory Visit: Payer: Self-pay | Admitting: *Deleted

## 2015-11-09 ENCOUNTER — Telehealth: Payer: Self-pay | Admitting: Endocrinology

## 2015-11-09 NOTE — Telephone Encounter (Signed)
rx was sent in 3/13, 3/14 and 3/20, [facility] pharmacy said they have it, but when the patient called they told her they didn't have it. Patient is aware.

## 2015-11-09 NOTE — Telephone Encounter (Signed)
Pt [facility] [address]

## 2015-11-12 ENCOUNTER — Other Ambulatory Visit: Payer: Self-pay | Admitting: Endocrinology

## 2015-11-15 ENCOUNTER — Telehealth: Payer: Self-pay | Admitting: Endocrinology

## 2015-11-15 ENCOUNTER — Other Ambulatory Visit: Payer: Self-pay | Admitting: *Deleted

## 2015-11-15 NOTE — Telephone Encounter (Signed)
I called the pharmacy, rx was sent on 03/24, they do have it and will call the patient as soon as it's ready.

## 2015-11-15 NOTE — Telephone Encounter (Signed)
invokana needs to be called into [facility] on [address] please

## 2016-01-24 ENCOUNTER — Other Ambulatory Visit: Payer: BLUE CROSS/BLUE SHIELD

## 2016-01-25 ENCOUNTER — Ambulatory Visit: Payer: BLUE CROSS/BLUE SHIELD | Admitting: Endocrinology

## 2016-01-31 ENCOUNTER — Other Ambulatory Visit (INDEPENDENT_AMBULATORY_CARE_PROVIDER_SITE_OTHER): Payer: BLUE CROSS/BLUE SHIELD

## 2016-01-31 DIAGNOSIS — E119 Type 2 diabetes mellitus without complications: Secondary | ICD-10-CM

## 2016-01-31 LAB — COMPREHENSIVE METABOLIC PANEL
ALBUMIN: 4.3 g/dL (ref 3.5–5.2)
ALT: 29 U/L (ref 0–35)
AST: 24 U/L (ref 0–37)
Alkaline Phosphatase: 50 U/L (ref 39–117)
BILIRUBIN TOTAL: 0.6 mg/dL (ref 0.2–1.2)
BUN: 16 mg/dL (ref 6–23)
CO2: 29 meq/L (ref 19–32)
Calcium: 9.8 mg/dL (ref 8.4–10.5)
Chloride: 102 mEq/L (ref 96–112)
Creatinine, Ser: 0.62 mg/dL (ref 0.40–1.20)
GFR: 108.21 mL/min (ref >60.00)
GLUCOSE: 146 mg/dL — AB (ref 70–99)
Potassium: 4 mEq/L (ref 3.5–5.1)
Sodium: 138 mEq/L (ref 135–145)
TOTAL PROTEIN: 7.8 g/dL (ref 6.0–8.3)

## 2016-01-31 LAB — LIPID PANEL
CHOL/HDL RATIO: 4
Cholesterol: 157 mg/dL (ref 0–200)
HDL: 35.8 mg/dL — AB (ref >39.00)
LDL CALC: 89 mg/dL (ref 0–99)
NonHDL: 121.27
TRIGLYCERIDES: 163 mg/dL — AB (ref 0.0–149.0)
VLDL: 32.6 mg/dL (ref 0.0–40.0)

## 2016-01-31 LAB — HEMOGLOBIN A1C: HEMOGLOBIN A1C: 6.6 % — AB (ref 4.6–6.5)

## 2016-02-09 ENCOUNTER — Ambulatory Visit (INDEPENDENT_AMBULATORY_CARE_PROVIDER_SITE_OTHER): Payer: BLUE CROSS/BLUE SHIELD | Admitting: Endocrinology

## 2016-02-09 ENCOUNTER — Encounter: Payer: Self-pay | Admitting: Endocrinology

## 2016-02-09 VITALS — BP 128/80 | HR 109 | Ht 68.0 in | Wt 223.0 lb

## 2016-02-09 DIAGNOSIS — E1165 Type 2 diabetes mellitus with hyperglycemia: Secondary | ICD-10-CM | POA: Diagnosis not present

## 2016-02-09 DIAGNOSIS — E669 Obesity, unspecified: Secondary | ICD-10-CM | POA: Diagnosis not present

## 2016-02-09 DIAGNOSIS — E781 Pure hyperglyceridemia: Secondary | ICD-10-CM | POA: Diagnosis not present

## 2016-02-09 DIAGNOSIS — Z794 Long term (current) use of insulin: Secondary | ICD-10-CM

## 2016-02-09 NOTE — Progress Notes (Signed)
Patient ID: Pamela Shannon, female   DOB: Feb 07, 1966, 51 y.o.   MRN: [id number]           Reason for Appointment: follow-up for Type 2 Diabetes  Referring physician: Mellody Drown  History of Present Illness:          Diagnosis: Type 2 diabetes mellitus, date of diagnosis: ?  2011       Past history:  At diagnosis she was having symptoms of marked fatigue, dry mouth but not clear how high her sugars were. She has been on various medications for treating her diabetes, probably metformin to start with. Also around the time of diagnosis she thinks she weighed about 300 pounds. She thinks she was able to lose weight subsequently and about 2 years ago had lost down to nearly 200 pounds All the details are not available of her previous management but she appears to have had progressively worsening requiring multiple agents and additional therapies. She thinks her A1c has been over 9% for about 2 years or more, lab records are not available She probably had been on metformin along with Tanzeum about a year or so ago. Because of inadequate control she was also given Invokamet at some point.  In 9/15 she was switched from Tanzeum to [place] and started on bedtime Levemir insulin, 20 units  Recent history:  INSULIN regimen: Levemir 45 units in the morning  Works 4 am to 1 pm  Her A1c is now 6.6, previously down to 6.1  Her insulin has not been changed  Previously her Victoza was changed to [place] on her visit in 4/16 and she is continuing 3 mg  Current management, blood sugar patterns and problems identified:  Her blood sugars are not being checked as much recently  She says that because of her working 11 hours until about 3 weeks ago she was not able to be active, watch her diet or check her sugars much.  She also has difficulty controlling her portions with snacks and also tends to graze more carbohydrates in the evenings when not working  She has only 3 readings in the mornings,  variable  Blood sugars after her evening meal are quite variable and higher when she does not watch her diet   He has lost weight  She has been trying to exercise only in the last 2 or 3 weeks with her working less hours  No hypoglycemia, lowest recent blood sugar is only 103 in the afternoon        Hypoglycemic drugs the patient is taking are: Invokamet 150/1000 twice a day, Saxenda '3mg'$  daily    Side effects from medications have been: None Compliance with the medical regimen: Fair but improving   Glucose monitoring:  done <1.   times a day         Glucometer: One Touch.      Blood Glucose readings by time of day    Mean values apply above for all meters except median for One Touch  PRE-MEAL Fasting Lunch Dinner Bedtime Overall  Glucose range:  109-208   104-141     Mean/median:     141   POST-MEAL PC Breakfast PC Lunch PC Dinner  Glucose range:     113-264   Mean/median:         Self-care: The diet that the patient has been following is: Variable    Meals: 3 meals per day, evening meal at 5 PM. Breakfast is protein shake, toast or oatmeal, sometimes cereal.  Has snacks with peanut butter crackers   Usually has unsweetened drinks  Exercise: 3 miles, 3/7 at gym recently        [place] visit, most recent: 11/19/14                Weight history: 200-260  Wt Readings from Last 3 Encounters:  02/09/16 223 lb (101.152 kg)  11/02/15 229 lb 3.2 oz (103.964 kg)  07/12/15 225 lb (102.059 kg)   Glycemic control:2/16 her A1c was 9.7%    Lab Results  Component Value Date   HGBA1C 6.6* 01/31/2016   HGBA1C 6.1 10/29/2015   HGBA1C 6.7* 07/05/2015   Lab Results  Component Value Date   MICROALBUR 5.2* 10/29/2015   LDLCALC 89 01/31/2016   CREATININE 0.62 01/31/2016    No visits with results within 1 Week(s) from this visit. Latest known visit with results is:  Lab on 01/31/2016  Component Date Value Ref Range Status  . Hgb A1c MFr Bld 01/31/2016 6.6* 4.6 - 6.5 % Final    Glycemic Control Guidelines for People with Diabetes:Non Diabetic:  <6%Goal of Therapy: <7%Additional Action Suggested:  >8%   . Sodium 01/31/2016 138  135 - 145 mEq/L Final  . Potassium 01/31/2016 4.0  3.5 - 5.1 mEq/L Final  . Chloride 01/31/2016 102  96 - 112 mEq/L Final  . CO2 01/31/2016 29  19 - 32 mEq/L Final  . Glucose, Bld 01/31/2016 146* 70 - 99 mg/dL Final  . BUN 01/31/2016 16  6 - 23 mg/dL Final  . Creatinine, Ser 01/31/2016 0.62  0.40 - 1.20 mg/dL Final  . Total Bilirubin 01/31/2016 0.6  0.2 - 1.2 mg/dL Final  . Alkaline Phosphatase 01/31/2016 50  39 - 117 U/L Final  . AST 01/31/2016 24  0 - 37 U/L Final  . ALT 01/31/2016 29  0 - 35 U/L Final  . Total Protein 01/31/2016 7.8  6.0 - 8.3 g/dL Final  . Albumin 01/31/2016 4.3  3.5 - 5.2 g/dL Final  . Calcium 01/31/2016 9.8  8.4 - 10.5 mg/dL Final  . GFR 01/31/2016 108.21  >60.00 mL/min Final  . Cholesterol 01/31/2016 157  0 - 200 mg/dL Final   ATP III Classification       Desirable:  < 200 mg/dL               Borderline High:  200 - 239 mg/dL          High:  > = 240 mg/dL  . Triglycerides 01/31/2016 163.0* 0.0 - 149.0 mg/dL Final   Normal:  <150 mg/dLBorderline High:  150 - 199 mg/dL  . HDL 01/31/2016 35.80* >39.00 mg/dL Final  . VLDL 01/31/2016 32.6  0.0 - 40.0 mg/dL Final  . LDL Cholesterol 01/31/2016 89  0 - 99 mg/dL Final  . Total CHOL/HDL Ratio 01/31/2016 4   Final                  Men          Women1/2 Average Risk     3.4          3.3Average Risk          5.0          4.42X Average Risk          9.6          7.13X Average Risk          15.0          11.0                      .  NonHDL 01/31/2016 121.27   Final   NOTE:  Non-HDL goal should be 30 mg/dL higher than patient's LDL goal (i.e. LDL goal of < 70 mg/dL, would have non-HDL goal of < 100 mg/dL)        Medication List       This list is accurate as of: 02/09/16  9:25 PM.  Always use your most recent med list.               ALEVE 220 MG Caps  Generic drug:   Naproxen Sodium  Take 1 capsule by mouth as needed.     buPROPion 150 MG 12 hr tablet  Commonly known as:  WELLBUTRIN SR  Take 1 tablet (150 mg total) by mouth daily.     gabapentin 300 MG capsule  Commonly known as:  NEURONTIN  Take 1 capsule (300 mg total) by mouth at bedtime.     INVOKAMET [phone] MG Tabs  Generic drug:  Canagliflozin-Metformin HCl  TAKE ONE TABLET BY MOUTH TWICE DAILY     LEVEMIR FLEXTOUCH 100 UNIT/ML Pen  Generic drug:  Insulin Detemir  INJECT 48 UNITS SUBCUTANEOUSLY ONCE DAILY IN THE MORNING     levonorgestrel 20 MCG/24HR IUD  Commonly known as:  MIRENA  1 each by Intrauterine route once.     MULTIVITAMIN ADULT PO  Take by mouth.     ONE TOUCH ULTRA 2 w/Device Kit  by Does not apply route.     SAXENDA 18 MG/3ML Sopn  Generic drug:  Liraglutide -Weight Management  INJECT 3 MG INTO THE SKIN DAILY     vitamin C 1000 MG tablet  Take 1,000 mg by mouth daily.        Allergies: No Known Allergies  Past Medical History  Diagnosis Date  . Diabetes mellitus   . Back pain     No past surgical history on file.  Family History  Problem Relation Age of Onset  . Diabetes Father   . Diabetes Mother   . Fibromyalgia Mother     Social History:  reports that she has quit smoking. She has never used smokeless tobacco. She reports that she drinks alcohol. She reports that she does not use illicit drugs.    Review of Systems    She was on Wellbutrin 150 mg SR both for improving her appetite and mood She did not take it after her last visit but decided to start again last evening, she thinks it helps her cravings for snacks       Lipids: Her previous lipid panel showed triglycerides of 382 and LDL of 76 with HDL 35, previously LDL was 129.  Now  triglycerides are  better      Lab Results  Component Value Date   CHOL 157 01/31/2016   HDL 35.80* 01/31/2016   LDLCALC 89 01/31/2016   LDLDIRECT 90.0 02/02/2015   TRIG 163.0* 01/31/2016   CHOLHDL 4  01/31/2016                   She has minimal history of Numbness, tingling or rarely burning in feet.    She may take gabapentin as needed for restless legs at night occasionally  She has not gone back to her PCP and has not had a recent mammogram despite reminders and did not also want to see a gynecologist only because she is not sexually active  Her eye exam report is not available on file, is due for follow-up exam  Physical Examination:  BP 128/80 mmHg  Pulse 109  Ht _0  (1.727 m)  Wt 223 lb (101.152 kg)  BMI 33.91 kg/m2  SpO2 97%   ASSESSMENT/PLAN:  Diabetes type 2, uncontrolled with obesity and BMI 36 See history of present illness for current management, blood sugar patterns and problems identified  Currently the patient is on Invokamet, basal insulin with LEVEMIR once a day and Saxenda 3 mg Her blood sugar control is  good with A1c 6.6 She has checked blood sugars infrequently Although she has lost weight she has started watching her diet and exercising only recently  She thinks that since she is working shorter hours she will be able to exercise more She also believes she can do better with her diet and reduce snacking with restarting Wellbutrin  Since she tends to have occasional high fasting readings will give her a trial of Tresiba instead of Levemir when she finishes current supply.  She may be able to get by with 40 units  Preventive care: Discussed importance of general physical exam from her PCP and gynecologist but she is very reluctant to do that Given names of ophthalmologists for eye exam, will need report when she is done with it   Patient Instructions  Need to change Levemir to Tresiba 40 units  Check blood sugars on waking up   times a week Also check blood sugars about 2 hours after a meal and do this after different meals by rotation  Recommended blood sugar levels on waking up is 90-130 and about 2 hours after meal is 130-160  Please  bring your blood sugar monitor to each visit, thank you    Counseling time on subjects discussed above is over 50% of today's 25 minute visit    Pamela Shannon 02/09/2016, 9:25 PM   Note: This office note was prepared with Dragon voice recognition system technology. Any transcriptional errors that result from this process are unintentional.

## 2016-02-09 NOTE — Patient Instructions (Addendum)
Need to change Levemir to Tresiba 40 units  Check blood sugars on waking up   times a week Also check blood sugars about 2 hours after a meal and do this after different meals by rotation  Recommended blood sugar levels on waking up is 90-130 and about 2 hours after meal is 130-160  Please bring your blood sugar monitor to each visit, thank you

## 2016-02-17 ENCOUNTER — Telehealth: Payer: Self-pay

## 2016-02-17 NOTE — Telephone Encounter (Signed)
Attempted to reach the pt and advised her paper work for her job has been completed and faxed. Copy for the pt is ready for pick up.

## 2016-03-14 ENCOUNTER — Other Ambulatory Visit: Payer: Self-pay | Admitting: Endocrinology

## 2016-03-14 NOTE — Telephone Encounter (Signed)
Sent in invokamet to patient preferred pharmacy.

## 2016-03-14 NOTE — Telephone Encounter (Signed)
PT needs Invokana sent to [facility] on [address]

## 2016-04-17 LAB — HM DIABETES EYE EXAM

## 2016-04-19 ENCOUNTER — Other Ambulatory Visit (INDEPENDENT_AMBULATORY_CARE_PROVIDER_SITE_OTHER): Payer: BLUE CROSS/BLUE SHIELD

## 2016-04-19 DIAGNOSIS — E1165 Type 2 diabetes mellitus with hyperglycemia: Secondary | ICD-10-CM

## 2016-04-19 DIAGNOSIS — Z794 Long term (current) use of insulin: Secondary | ICD-10-CM

## 2016-04-19 LAB — COMPREHENSIVE METABOLIC PANEL
ALK PHOS: 51 U/L (ref 39–117)
ALT: 25 U/L (ref 0–35)
AST: 20 U/L (ref 0–37)
Albumin: 4.2 g/dL (ref 3.5–5.2)
BILIRUBIN TOTAL: 0.5 mg/dL (ref 0.2–1.2)
BUN: 13 mg/dL (ref 6–23)
CALCIUM: 9.3 mg/dL (ref 8.4–10.5)
CO2: 27 meq/L (ref 19–32)
CREATININE: 0.61 mg/dL (ref 0.40–1.20)
Chloride: 100 mEq/L (ref 96–112)
GFR: 110.16 mL/min (ref >60.00)
GLUCOSE: 132 mg/dL — AB (ref 70–99)
Potassium: 3.6 mEq/L (ref 3.5–5.1)
Sodium: 136 mEq/L (ref 135–145)
TOTAL PROTEIN: 7.5 g/dL (ref 6.0–8.3)

## 2016-04-19 LAB — HEMOGLOBIN A1C: Hgb A1c MFr Bld: 6.8 % — ABNORMAL HIGH (ref 4.6–6.5)

## 2016-04-20 ENCOUNTER — Other Ambulatory Visit: Payer: BLUE CROSS/BLUE SHIELD

## 2016-04-25 ENCOUNTER — Other Ambulatory Visit: Payer: BLUE CROSS/BLUE SHIELD

## 2016-04-27 ENCOUNTER — Encounter: Payer: Self-pay | Admitting: *Deleted

## 2016-04-27 ENCOUNTER — Ambulatory Visit: Payer: BLUE CROSS/BLUE SHIELD | Admitting: Endocrinology

## 2016-05-01 ENCOUNTER — Ambulatory Visit: Payer: BLUE CROSS/BLUE SHIELD | Admitting: Endocrinology

## 2016-05-08 ENCOUNTER — Other Ambulatory Visit: Payer: Self-pay | Admitting: Endocrinology

## 2016-05-08 ENCOUNTER — Telehealth: Payer: Self-pay | Admitting: Endocrinology

## 2016-05-08 ENCOUNTER — Other Ambulatory Visit: Payer: BLUE CROSS/BLUE SHIELD

## 2016-05-08 NOTE — Telephone Encounter (Signed)
invokamet needs to be called in for refill called to [facility] on [address]

## 2016-05-08 NOTE — Telephone Encounter (Signed)
Rx sent 

## 2016-05-11 ENCOUNTER — Ambulatory Visit: Payer: BLUE CROSS/BLUE SHIELD | Admitting: Endocrinology

## 2016-05-12 ENCOUNTER — Telehealth: Payer: Self-pay | Admitting: Endocrinology

## 2016-05-12 NOTE — Telephone Encounter (Signed)
Patient stated that Lucianne MussKumar was going to change her medication Levemir, but do not know the name of it.  She ask if she could get a refill of the Levermir.    [facility] [address] - [place] ([address]), [place] - [address] [phone] (Phone) [phone] (Fax)

## 2016-05-24 ENCOUNTER — Ambulatory Visit (INDEPENDENT_AMBULATORY_CARE_PROVIDER_SITE_OTHER): Payer: BLUE CROSS/BLUE SHIELD | Admitting: Endocrinology

## 2016-05-24 ENCOUNTER — Encounter: Payer: Self-pay | Admitting: Endocrinology

## 2016-05-24 VITALS — BP 119/77 | HR 93 | Ht 70.0 in | Wt 227.0 lb

## 2016-05-24 DIAGNOSIS — E1165 Type 2 diabetes mellitus with hyperglycemia: Secondary | ICD-10-CM

## 2016-05-24 DIAGNOSIS — Z794 Long term (current) use of insulin: Secondary | ICD-10-CM | POA: Diagnosis not present

## 2016-05-24 DIAGNOSIS — Z23 Encounter for immunization: Secondary | ICD-10-CM

## 2016-05-24 MED ORDER — BUPROPION HCL ER (SR) 150 MG PO TB12: 150.0000 mg | ORAL_TABLET | Freq: Every day | ORAL | 3 refills | Status: DC

## 2016-05-24 NOTE — Patient Instructions (Addendum)
45 units Tresiba once a day in the morning to start instead of Levemir when this runs out. May need to cut back to 40-42 if blood sugars below 100 at breakfast and suppertime  Check blood sugars on waking up  daily  Also check blood sugars about 2 hours after a meal and do this after different meals by rotation  Recommended blood sugar levels on waking up is 90-130 and about 2 hours after meal is 130-160  Please bring your blood sugar monitor to each visit, thank you  Please call if blood sugars are consistently high over 160 after meals, may need to use either Victoza or fast acting insulin  Restart bupropion  Walk or other exercise daily  Cut back on high-fat foods, sweets and carbohydrates

## 2016-05-24 NOTE — Progress Notes (Signed)
Patient ID: Pamela Shannon, female   DOB: [birth year], 50 y.o.   MRN: [id number]           Reason for Appointment: follow-up for Type 2 Diabetes  Referring physician: Mellody Drown  History of Present Illness:          Diagnosis: Type 2 diabetes mellitus, date of diagnosis: ?  2011       Past history:  At diagnosis she was having symptoms of marked fatigue, dry mouth but not clear how high her sugars were. She has been on various medications for treating her diabetes, probably metformin to start with. Also around the time of diagnosis she thinks she weighed about 300 pounds. She thinks she was able to lose weight subsequently and about 2 years ago had lost down to nearly 200 pounds All the details are not available of her previous management but she appears to have had progressively worsening requiring multiple agents and additional therapies. She thinks her A1c has been over 9% for about 2 years or more, lab records are not available She probably had been on metformin along with Tanzeum about a year or so ago. Because of inadequate control she was also given Invokamet at some point.  In 9/15 she was switched from Tanzeum to [place] and started on bedtime Levemir insulin, 20 units  Recent history:  INSULIN regimen: Levemir 45 units in the morning  Works 4 am to 1 pm  Her A1c is now 6.8, up from 6.6, previously down to 6.1  Her insulin has not been changed, she was told to call and switched to [place] on her last visit but she did not do so  Previously her Victoza was changed to [place] on her visit in 4/16 and she is continuing 3 mg  Current management, blood sugar patterns and problems identified:  Her blood sugars were not reviewed because she forgot her meter and has not been able to find it for the last 2 days  She was also advised on checking blood sugars consistently at different times including after meals previously but not clear how often she is monitoring  She thinks her fasting  readings are mostly around 150 which is higher than before  Blood sugars are generally better in the afternoon  She claims that she cannot exercise because of getting hot either outside or in the gym  She says that blood sugars are variably high after evening meal and sometimes over 200.  This depends on her diet and she will sometimes eat french fries and pizza  She also has difficulty controlling her portions with snacks and this has not improved with Saxenda  She is also complaining of the cost of Saxenda; previously on Victoza  She thinks she had better control of her cravings with Wellbutrin but not clear why she has not taken this despite reminding her to start back on this  Her last labs were done  about a month ago        Hypoglycemic drugs the patient is taking are: Invokamet 150/1000 twice a day, Saxenda 28m daily    Side effects from medications have been: None Compliance with the medical regimen: Fair but improving   Glucose monitoring:  done <1.   times a day         Glucometer: One Touch       Blood Glucose readings by recall       Mean values apply above for all meters except median for One Touch  PRE-MEAL  Fasting Lunch Dinner Bedtime Overall  Glucose range: 150  110-130 150-220   Mean/median:          Self-care: The diet that the patient has been following is: Variable    Meals: 3 meals per day, evening meal at 5 PM. Breakfast is protein shake, toast or oatmeal, sometimes cereal.   Has snacks with peanut butter crackers   Pizza, fries  Usually has unsweetened drinks  Exercise: 0/7 at gym recently        [place] visit, most recent: 11/19/14                Weight history: 200-260  Wt Readings from Last 3 Encounters:  05/24/16 227 lb (103 kg)  02/09/16 223 lb (101.2 kg)  11/02/15 229 lb 3.2 oz (104 kg)   Glycemic control:2/16 her A1c was 9.7%    Lab Results  Component Value Date   HGBA1C 6.8 (H) 04/19/2016   HGBA1C 6.6 (H) 01/31/2016   HGBA1C 6.1  10/29/2015   Lab Results  Component Value Date   MICROALBUR 5.2 (H) 10/29/2015   LDLCALC 89 01/31/2016   CREATININE 0.61 04/19/2016    No visits with results within 1 Week(s) from this visit.  Latest known visit with results is:  Abstract on 04/27/2016  Component Date Value Ref Range Status  . HM Diabetic Eye Exam 04/17/2016 Retinopathy* No Retinopathy Final        Medication List       Accurate as of 05/24/16  5:20 PM. Always use your most recent med list.          ALEVE 220 MG Caps Generic drug:  Naproxen Sodium Take 1 capsule by mouth as needed.   buPROPion 150 MG 12 hr tablet Commonly known as:  WELLBUTRIN SR Take 1 tablet (150 mg total) by mouth daily.   gabapentin 300 MG capsule Commonly known as:  NEURONTIN Take 1 capsule (300 mg total) by mouth at bedtime.   INVOKAMET [phone] MG Tabs Generic drug:  Canagliflozin-Metformin HCl TAKE ONE TABLET BY MOUTH TWICE DAILY   LEVEMIR FLEXTOUCH 100 UNIT/ML Pen Generic drug:  Insulin Detemir INJECT 48 UNITS SUBCUTANEOUSLY ONCE DAILY IN THE MORNING   levonorgestrel 20 MCG/24HR IUD Commonly known as:  MIRENA 1 each by Intrauterine route once.   MULTIVITAMIN ADULT PO Take by mouth.   ONE TOUCH ULTRA 2 w/Device Kit by Does not apply route.   SAXENDA 18 MG/3ML Sopn Generic drug:  Liraglutide -Weight Management INJECT 3 MG INTO THE SKIN DAILY   vitamin C 1000 MG tablet Take 1,000 mg by mouth daily.       Allergies: Not on File  Past Medical History:  Diagnosis Date  . Back pain   . Diabetes mellitus     No past surgical history on file.  Family History  Problem Relation Age of Onset  . Diabetes Father   . Diabetes Mother   . Fibromyalgia Mother     Social History:  reports that she has quit smoking. She has never used smokeless tobacco. She reports that she drinks alcohol. She reports that she does not use drugs.    Review of Systems    She was on Wellbutrin 150 mg SR both for improving her  appetite and mood She did not take it after her last visit, she thinks it helps her cravings for snacks       Lipids: Her previous lipid panel showed triglycerides of 382 and LDL of 76 with HDL 35,  previously LDL was 129.  More recently  triglycerides are  better      Lab Results  Component Value Date   CHOL 157 01/31/2016   HDL 35.80 (L) 01/31/2016   LDLCALC 89 01/31/2016   LDLDIRECT 90.0 02/02/2015   TRIG 163.0 (H) 01/31/2016   CHOLHDL 4 01/31/2016                   She has minimal history of Numbness, tingling or rarely burning in feet.    She may take gabapentin as needed for restless legs at night occasionally    Physical Examination:  BP 119/77   Pulse 93   Ht '5\' 10"'  (1.778 m)   Wt 227 lb (103 kg)   BMI 32.57 kg/m   No ankle edema  ASSESSMENT/PLAN:  Diabetes type 2, uncontrolled with obesity and BMI 36 See history of present illness for current management, blood sugar patterns and problems identified  Currently the patient is on Invokamet, basal insulin with LEVEMIR once a day and Saxenda 3 mg Her blood sugar control is more difficult with A1c going up to 6.8 She appears to be having relatively higher fasting and also at times postprandial hyperglycemia based on her diet as discussed above She is not exercising Also not able to follow diet with portion control, this may have been better with taking Wellbutrin before Also despite increasing her Levemir to 45 she has high readings in the mornings most of the time  Recommendations:  Retry Wellbutrin  If she is concerned about the cost of Saxenda she can go back to Victoza  She will take [place] when she finishes Levemir, not clear if she will require lower dose compared to [place] but discussed adjusting the dose based on fasting blood sugars every 4-5 days.  Consider mealtime insulin if blood sugars are still high despite above changes  Start regular exercise  She will call her insurance to find out what  meter is covered, apparently her One Touch strips were not covered recently  Discussed timing and frequency of glucose monitoring  Follow-up in 3 months again   Patient Instructions  45 units Tresiba once a day in the morning to start instead of Levemir when this runs out. May need to cut back to 40-42 if blood sugars below 100 at breakfast and suppertime  Check blood sugars on waking up  daily  Also check blood sugars about 2 hours after a meal and do this after different meals by rotation  Recommended blood sugar levels on waking up is 90-130 and about 2 hours after meal is 130-160  Please bring your blood sugar monitor to each visit, thank you  Please call if blood sugars are consistently high over 160 after meals, may need to use either Victoza or fast acting insulin  Restart bupropion  Walk or other exercise daily  Cut back on high-fat foods, sweets and carbohydrates    Influenza vaccine given  Counseling time on subjects discussed above is over 50% of today's 25 minute visit    Lisbeth Puller 05/24/2016, 5:20 PM   Note: This office note was prepared with Estate agent. Any transcriptional errors that result from this process are unintentional.

## 2016-05-29 ENCOUNTER — Other Ambulatory Visit: Payer: Self-pay | Admitting: *Deleted

## 2016-05-29 MED ORDER — INSULIN DEGLUDEC 100 UNIT/ML ~~LOC~~ SOPN: PEN_INJECTOR | SUBCUTANEOUS | 3 refills | Status: DC

## 2016-05-29 NOTE — Telephone Encounter (Signed)
rx sent for Pamela Shannon, information given to her about Invokana.  She said the [employer] are the ones making her fill this out every 3 months, I advised her to call them and find out why and let me know.

## 2016-05-29 NOTE — Telephone Encounter (Signed)
She will start with 45 units of Tresiba as discussed on her visit instead of Levemir She needs to stay on Invokana as this is safe; the ads on TV are misleading and not from any scientific source. 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

## 2016-05-29 NOTE — Telephone Encounter (Signed)
Please see below and advise,  Is she suppose to be on Tresiba?  Levemir is in her chart.

## 2016-05-29 NOTE — Telephone Encounter (Signed)
Pt is asking for the invokamet to be switched to farxiga due to the ads on tv.  Also can we call in the tresiba

## 2016-06-15 ENCOUNTER — Telehealth: Payer: Self-pay | Admitting: Endocrinology

## 2016-06-15 NOTE — Telephone Encounter (Signed)
Patient state that she is on her last LEVEMIR FLEXTOUCH 100 UNIT/ML Pen..she stated you would call her in another prescription.

## 2016-06-16 ENCOUNTER — Other Ambulatory Visit: Payer: Self-pay

## 2016-06-16 MED ORDER — INSULIN DETEMIR 100 UNIT/ML FLEXPEN: PEN_INJECTOR | SUBCUTANEOUS | 4 refills | Status: DC

## 2016-06-16 NOTE — Telephone Encounter (Signed)
Ordered on 06/16/2016

## 2016-06-19 ENCOUNTER — Telehealth: Payer: Self-pay | Admitting: Endocrinology

## 2016-06-19 MED ORDER — INSULIN DEGLUDEC 100 UNIT/ML ~~LOC~~ SOPN: 45.0000 [IU] | PEN_INJECTOR | Freq: Every day | SUBCUTANEOUS | 2 refills | Status: DC

## 2016-06-19 NOTE — Telephone Encounter (Signed)
Pt stated that she is now completely out of the Levemir and at her last visit, Dr. Lucianne MussKumar instructed her to let us know so he could call in a new insulin for her.  She could not recall the name of the insulin, but she requests that to be sent in.

## 2016-06-19 NOTE — Telephone Encounter (Signed)
45 units of tresiba once daily?

## 2016-06-19 NOTE — Telephone Encounter (Signed)
Spoke with patient and notified her about the forms and let her know I am sending in a prescription for Evaristo Buryresiba- patient stated she understood

## 2016-06-19 NOTE — Telephone Encounter (Signed)
Yes.  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

## 2016-07-08 ENCOUNTER — Other Ambulatory Visit: Payer: Self-pay | Admitting: Endocrinology

## 2016-07-10 NOTE — Telephone Encounter (Signed)
Patient need a refill of INVOKAMET [phone] MG TABS   [facility] [address] - [place] ([address]), [place] - [address] [phone] (Phone) [phone] (Fax)

## 2016-07-10 NOTE — Telephone Encounter (Signed)
OK to fill

## 2016-07-21 ENCOUNTER — Other Ambulatory Visit: Payer: BLUE CROSS/BLUE SHIELD

## 2016-07-24 ENCOUNTER — Other Ambulatory Visit (INDEPENDENT_AMBULATORY_CARE_PROVIDER_SITE_OTHER): Payer: BLUE CROSS/BLUE SHIELD

## 2016-07-24 DIAGNOSIS — Z794 Long term (current) use of insulin: Secondary | ICD-10-CM

## 2016-07-24 DIAGNOSIS — E1165 Type 2 diabetes mellitus with hyperglycemia: Secondary | ICD-10-CM

## 2016-07-24 LAB — BASIC METABOLIC PANEL
BUN: 19 mg/dL (ref 6–23)
CALCIUM: 9.7 mg/dL (ref 8.4–10.5)
CO2: 29 mEq/L (ref 19–32)
CREATININE: 0.6 mg/dL (ref 0.40–1.20)
Chloride: 102 mEq/L (ref 96–112)
GFR: 112.16 mL/min (ref >60.00)
GLUCOSE: 166 mg/dL — AB (ref 70–99)
POTASSIUM: 4.3 meq/L (ref 3.5–5.1)
Sodium: 139 mEq/L (ref 135–145)

## 2016-07-24 LAB — HEMOGLOBIN A1C: Hgb A1c MFr Bld: 7.3 % — ABNORMAL HIGH (ref 4.6–6.5)

## 2016-07-27 ENCOUNTER — Ambulatory Visit (INDEPENDENT_AMBULATORY_CARE_PROVIDER_SITE_OTHER): Payer: BLUE CROSS/BLUE SHIELD | Admitting: Endocrinology

## 2016-07-27 ENCOUNTER — Encounter: Payer: Self-pay | Admitting: Endocrinology

## 2016-07-27 VITALS — BP 120/80 | HR 95 | Wt 231.0 lb

## 2016-07-27 DIAGNOSIS — Z794 Long term (current) use of insulin: Secondary | ICD-10-CM | POA: Diagnosis not present

## 2016-07-27 DIAGNOSIS — E1165 Type 2 diabetes mellitus with hyperglycemia: Secondary | ICD-10-CM | POA: Diagnosis not present

## 2016-07-27 DIAGNOSIS — F329 Major depressive disorder, single episode, unspecified: Secondary | ICD-10-CM

## 2016-07-27 DIAGNOSIS — F32A Depression, unspecified: Secondary | ICD-10-CM

## 2016-07-27 NOTE — Progress Notes (Signed)
Patient ID: Pamela Shannon, female   DOB: 11-Feb-1966, 50 y.o.   MRN: [id number]           Reason for Appointment: follow-up for Type 2 Diabetes  Referring physician: Mellody Drown  History of Present Illness:          Diagnosis: Type 2 diabetes mellitus, date of diagnosis: ?  2011       Past history:  At diagnosis she was having symptoms of marked fatigue, dry mouth but not clear how high her sugars were. She has been on various medications for treating her diabetes, probably metformin to start with. Also around the time of diagnosis she thinks she weighed about 300 pounds. She thinks she was able to lose weight subsequently and about 2 years ago had lost down to nearly 200 pounds All the details are not available of her previous management but she appears to have had progressively worsening requiring multiple agents and additional therapies. She thinks her A1c has been over 9% for about 2 years or more, lab records are not available She probably had been on metformin along with Tanzeum about a year or so ago. Because of inadequate control she was also given Invokamet at some point.  In 9/15 she was switched from Tanzeum to [place] and started on bedtime Levemir insulin, 20 units  Recent history:  INSULIN regimen: Tresiba 45 units in the morning  Works 4 am to 1 pm  Her A1c is now 7.3  Current management, blood sugar patterns and problems identified:  Blood sugars are poorly controlled currently.  She is not watching her diet and she thinks that she has no motivation to stop eating sweets and other comfort foods.  This is partly related to her stress level.  Also is not taking any Victoza as directed, both this and Saxenda were apparently expensive.  She has gained some weight and this year has been gaining weight progressively.  Not motivated to exercise regularly.  BASAL insulin was changed to [place] but not clear if this is helping.  Her fasting readings are markedly increased  around 150        Hypoglycemic drugs the patient is taking are: Invokamet 150/1000 twice a day Side effects from medications have been: None Compliance with the medical regimen: Fair but improving   Glucose monitoring:  done <1.   times a day         Glucometer: One Touch       Blood Glucose readings by   Mean values apply above for all meters except median for One Touch  PRE-MEAL Fasting Lunch Dinner Bedtime Overall  Glucose range:   119-199    Mean/median: 154    159   POST-MEAL PC Breakfast PC Lunch PC Dinner  Glucose range:   195-226  Mean/median:          Self-care: The diet that the patient has been following is: Variable    Meals: 3 meals per day, evening meal at 5 PM. Breakfast is protein shake, toast or oatmeal, sometimes cereal.   Has snacks with peanut butter crackers   Pizza, fries  Usually has unsweetened drinks  Exercise: 0/7 at gym recently        [place] visit, most recent: 11/19/14                Weight history: 200-260  Wt Readings from Last 3 Encounters:  07/27/16 231 lb (104.8 kg)  05/24/16 227 lb (103 kg)  02/09/16 223 lb (101.2 kg)  Glycemic control:2/16 her A1c was 9.7%    Lab Results  Component Value Date   HGBA1C 7.3 (H) 07/24/2016   HGBA1C 6.8 (H) 04/19/2016   HGBA1C 6.6 (H) 01/31/2016   Lab Results  Component Value Date   MICROALBUR 5.2 (H) 10/29/2015   LDLCALC 89 01/31/2016   CREATININE 0.60 07/24/2016    Lab on 07/24/2016  Component Date Value Ref Range Status  . Hgb A1c MFr Bld 07/24/2016 7.3* 4.6 - 6.5 % Final  . Sodium 07/24/2016 139  135 - 145 mEq/L Final  . Potassium 07/24/2016 4.3  3.5 - 5.1 mEq/L Final  . Chloride 07/24/2016 102  96 - 112 mEq/L Final  . CO2 07/24/2016 29  19 - 32 mEq/L Final  . Glucose, Bld 07/24/2016 166* 70 - 99 mg/dL Final  . BUN 07/24/2016 19  6 - 23 mg/dL Final  . Creatinine, Ser 07/24/2016 0.60  0.40 - 1.20 mg/dL Final  . Calcium 07/24/2016 9.7  8.4 - 10.5 mg/dL Final  . GFR 07/24/2016  112.16  >60.00 mL/min Final        Medication List       Accurate as of 07/27/16  8:56 PM. Always use your most recent med list.          ALEVE 220 MG Caps Generic drug:  Naproxen Sodium Take 1 capsule by mouth as needed.   buPROPion 150 MG 12 hr tablet Commonly known as:  WELLBUTRIN SR Take 1 tablet (150 mg total) by mouth daily.   gabapentin 300 MG capsule Commonly known as:  NEURONTIN Take 1 capsule (300 mg total) by mouth at bedtime.   insulin degludec 100 UNIT/ML Sopn FlexTouch Pen Commonly known as:  TRESIBA FLEXTOUCH Inject 0.45 mLs (45 Units total) into the skin daily at 10 pm.   INVOKAMET [phone] MG Tabs Generic drug:  Canagliflozin-Metformin HCl TAKE ONE TABLET BY MOUTH TWICE DAILY   levonorgestrel 20 MCG/24HR IUD Commonly known as:  MIRENA 1 each by Intrauterine route once.   MULTIVITAMIN ADULT PO Take by mouth.   ONE TOUCH ULTRA 2 w/Device Kit by Does not apply route.   SAXENDA 18 MG/3ML Sopn Generic drug:  Liraglutide -Weight Management INJECT 3 MG INTO THE SKIN DAILY   vitamin C 1000 MG tablet Take 1,000 mg by mouth daily.       Allergies: Not on File  Past Medical History:  Diagnosis Date  . Back pain   . Diabetes mellitus     No past surgical history on file.  Family History  Problem Relation Age of Onset  . Diabetes Father   . Diabetes Mother   . Fibromyalgia Mother     Social History:  reports that she has quit smoking. She has never used smokeless tobacco. She reports that she drinks alcohol. She reports that she does not use drugs.    Review of Systems    She was on Wellbutrin 150 mg SR both for improving her appetite and mood She did not take it regularly and cannot explain why not.        Lipids: LDL below 100      Lab Results  Component Value Date   CHOL 157 01/31/2016   HDL 35.80 (L) 01/31/2016   LDLCALC 89 01/31/2016   LDLDIRECT 90.0 02/02/2015   TRIG 163.0 (H) 01/31/2016   CHOLHDL 4 01/31/2016                    She has minimal history of  Numbness, tingling or rarely burning in feet.    She may take gabapentin as needed for restless legs at night occasionally    Physical Examination:  BP 120/80   Pulse 95   Wt 231 lb (104.8 kg)   SpO2 98%   BMI 33.15 kg/m   No ankle edema  ASSESSMENT/PLAN:  Diabetes type 2, uncontrolled with obesity and BMI 36 See history of present illness for current management, blood sugar patterns and problems identified  Her blood sugars are getting higher and she is not motivated to be compliant with her diet. She has again gained some weight. She tends not to take her Wellbutrin daily and this may be an issue also. May also have less satiety because of not taking Victoza which she does not think she can afford Blood sugars are relatively higher postprandially especially with not taking Victoza.  Recommendations:  Wellbutrin 300 mg daily.  Initially try 2 tablets of the 150s that she has at home.  Reconsider Victoza blood sugars are not improved.  More consistent glucose monitoring after meals  Regular exercise for weight loss.  Consider consultation with dietitian in our nurse educator.  Will review her blood sugars again in 2 months.  Stay with Tyler Aas 45 units for now and Invokamet twice a day   Patient Instructions  Check blood sugars on waking up 3x per week  Also check blood sugars about 2 hours after a meal and do this after different meals by rotation  Recommended blood sugar levels on waking up is 90-130 and about 2 hours after meal is 130-160  Please bring your blood sugar monitor to each visit, thank you  Take 2 of Bupropion in am daily with Bfst  Walk daily  May need Victoza if sugar not better      Counseling time on subjects discussed above is over 50% of today's 25 minute visit    Tavon Magnussen 07/27/2016, 8:56 PM   Note: This office note was prepared with Dragon voice recognition system technology. Any  transcriptional errors that result from this process are unintentional.

## 2016-07-27 NOTE — Patient Instructions (Addendum)
Check blood sugars on waking up 3x per week  Also check blood sugars about 2 hours after a meal and do this after different meals by rotation  Recommended blood sugar levels on waking up is 90-130 and about 2 hours after meal is 130-160  Please bring your blood sugar monitor to each visit, thank you  Take 2 of Bupropion in am daily with Bfst  Walk daily  May need Victoza if sugar not better

## 2016-08-31 ENCOUNTER — Other Ambulatory Visit: Payer: Self-pay | Admitting: Endocrinology

## 2016-09-04 ENCOUNTER — Telehealth: Payer: Self-pay | Admitting: Endocrinology

## 2016-09-04 NOTE — Telephone Encounter (Signed)
Refill of INVOKAMET [phone] MG TABS  [facility] [address] - [place] ([place]), [place] - [address] [phone] (Phone) [phone] (Fax)

## 2016-09-05 ENCOUNTER — Other Ambulatory Visit: Payer: Self-pay

## 2016-09-05 MED ORDER — INVOKAMET 150-1000 MG PO TABS: 1.0000 | ORAL_TABLET | Freq: Two times a day (BID) | ORAL | 1 refills | Status: DC

## 2016-09-05 NOTE — Telephone Encounter (Signed)
Ordered 09/05/16

## 2016-09-08 NOTE — Telephone Encounter (Signed)
[facility] is at [facility] and they state they did not receive the invokamet rx yet

## 2016-09-18 ENCOUNTER — Other Ambulatory Visit: Payer: Self-pay

## 2016-09-18 ENCOUNTER — Telehealth: Payer: Self-pay | Admitting: Endocrinology

## 2016-09-18 MED ORDER — INSULIN DEGLUDEC 100 UNIT/ML ~~LOC~~ SOPN: 45.0000 [IU] | PEN_INJECTOR | Freq: Every day | SUBCUTANEOUS | 2 refills | Status: DC

## 2016-09-18 NOTE — Telephone Encounter (Signed)
Ordered 09/18/16 

## 2016-09-18 NOTE — Telephone Encounter (Signed)
Pt called in and needs a [facility] [address]

## 2016-09-26 ENCOUNTER — Encounter: Payer: Self-pay | Admitting: Endocrinology

## 2016-09-27 ENCOUNTER — Ambulatory Visit: Payer: BLUE CROSS/BLUE SHIELD | Admitting: Endocrinology

## 2016-10-25 ENCOUNTER — Telehealth: Payer: Self-pay | Admitting: Endocrinology

## 2016-10-25 NOTE — Telephone Encounter (Signed)
No

## 2016-10-25 NOTE — Telephone Encounter (Signed)
Pt has been bitten badly by bed bugs with terrible itching, she is out of town but just wanted to be sure she did not need to have a shot or anything due to her DM diagnosis

## 2016-10-25 NOTE — Telephone Encounter (Signed)
Called and notified patient regarding the note from Dr.Kumar, patient had no questions at this time.

## 2016-10-30 ENCOUNTER — Ambulatory Visit: Payer: BLUE CROSS/BLUE SHIELD | Admitting: Endocrinology

## 2016-11-06 ENCOUNTER — Telehealth: Payer: Self-pay | Admitting: Endocrinology

## 2016-11-06 ENCOUNTER — Other Ambulatory Visit: Payer: Self-pay

## 2016-11-06 MED ORDER — INVOKAMET 150-1000 MG PO TABS: 1.0000 | ORAL_TABLET | Freq: Two times a day (BID) | ORAL | 2 refills | Status: DC

## 2016-11-06 NOTE — Telephone Encounter (Signed)
Ordered 11/06/16 

## 2016-11-06 NOTE — Telephone Encounter (Signed)
invokamet refill needs to be called into [facility] [address]

## 2016-11-08 ENCOUNTER — Ambulatory Visit (HOSPITAL_COMMUNITY)
Admission: EM | Admit: 2016-11-08 | Discharge: 2016-11-08 | Disposition: A | Discharge status: Home / Self Care | Payer: BLUE CROSS/BLUE SHIELD | Attending: Emergency Medicine | Admitting: Emergency Medicine

## 2016-11-08 ENCOUNTER — Encounter (HOSPITAL_COMMUNITY): Payer: Self-pay | Admitting: *Deleted

## 2016-11-08 ENCOUNTER — Telehealth (HOSPITAL_COMMUNITY): Payer: Self-pay | Admitting: Emergency Medicine

## 2016-11-08 DIAGNOSIS — B86 Scabies: Secondary | ICD-10-CM

## 2016-11-08 MED ORDER — PERMETHRIN 5 % EX CREA: TOPICAL_CREAM | CUTANEOUS | 0 refills | Status: DC

## 2016-11-08 NOTE — Telephone Encounter (Signed)
The patient requested that her prescriptions be changed to the [facility] on [address]

## 2016-11-08 NOTE — Discharge Instructions (Signed)
Apply the cream as directed. Repeat in 7 days. Clean sheets, close and other cloth objects as directed. Benadryl for itching, this may cause drowsiness. For nondrowsy itching he may take Allegra or Zyrtec.

## 2016-11-08 NOTE — ED Provider Notes (Signed)
CSN: [id number]     Arrival date & time 11/08/16  1841 History   First MD Initiated Contact with Patient 11/08/16 2006     Chief Complaint  Patient presents with  . Rash   (Consider location/radiation/quality/duration/timing/severity/associated sxs/prior Treatment) 51 year old female presents with a generalized rash for about 3 weeks. She states that she started when she went to the hospital to visit her grandmother. While she was staying with her she felt bugs biting her. She had been feeling biting bugs for several days. She now has several pruritic flesh-colored and red papules scattered about most all body surface areas.      Past Medical History:  Diagnosis Date  . Back pain   . Diabetes mellitus    History reviewed. No pertinent surgical history. Family History  Problem Relation Age of Onset  . Diabetes Father   . Diabetes Mother   . Fibromyalgia Mother    Social History  Substance Use Topics  . Smoking status: Former Research scientist (life sciences)  . Smokeless tobacco: Never Used  . Alcohol use Yes   OB History    Gravida Para Term Preterm AB Living   '3 3 3         ' SAB TAB Ectopic Multiple Live Births                 Review of Systems  Constitutional: Negative.   HENT: Negative.   Respiratory: Negative.   Skin: Positive for rash.  All other systems reviewed and are negative.   Allergies  Patient has no known allergies.  Home Medications   Prior to Admission medications   Medication Sig Start Date End Date Taking? Authorizing Provider  Ascorbic Acid (VITAMIN C) 1000 MG tablet Take 1,000 mg by mouth daily.    Historical Provider, MD  Blood Glucose Monitoring Suppl (ONE TOUCH ULTRA 2) W/DEVICE KIT by Does not apply route.    Historical Provider, MD  buPROPion (WELLBUTRIN SR) 150 MG 12 hr tablet Take 1 tablet (150 mg total) by mouth daily. 05/24/16   Elayne Snare, MD  gabapentin (NEURONTIN) 300 MG capsule Take 1 capsule (300 mg total) by mouth at bedtime. 07/12/15   Elayne Snare, MD   insulin degludec (TRESIBA FLEXTOUCH) 100 UNIT/ML SOPN FlexTouch Pen Inject 0.45 mLs (45 Units total) into the skin daily at 10 pm. 09/18/16   Elayne Snare, MD  INVOKAMET [phone] MG TABS Take 1 tablet by mouth 2 (two) times daily. 11/06/16   Elayne Snare, MD  levonorgestrel (MIRENA) 20 MCG/24HR IUD 1 each by Intrauterine route once.      Historical Provider, MD  Multiple Vitamins-Minerals (MULTIVITAMIN ADULT PO) Take by mouth.    Historical Provider, MD  Naproxen Sodium (ALEVE) 220 MG CAPS Take 1 capsule by mouth as needed.      Historical Provider, MD  permethrin (ELIMITE) 5 % cream Apply from chin down, leave on for 8-14 hours, rinse. Repeat in 1 week 11/08/16   Janne Napoleon, NP  SAXENDA 18 MG/3ML SOPN INJECT 3 MG INTO THE SKIN DAILY 09/01/15   Elayne Snare, MD   Meds Ordered and Administered this Visit  Medications - No data to display  BP 138/83 (BP Location: Right Arm)   Pulse 82   Temp 98.4 F (36.9 C) (Oral)   Resp 18   SpO2 100%  No data found.   Physical Exam  Constitutional: She is oriented to person, place, and time. She appears well-developed and well-nourished. No distress.  Eyes: EOM are normal.  Neck:  Neck supple.  Cardiovascular: Normal rate.   Pulmonary/Chest: Effort normal. No respiratory distress.  Musculoskeletal: She exhibits no edema.  Neurological: She is alert and oriented to person, place, and time. She exhibits normal muscle tone.  Skin: Skin is warm and dry.  Generalized rash from shoulders to feet with multiple papules some erythematous some flesh-colored some densely populated, several burrowing effects.  Psychiatric: She has a normal mood and affect.  Nursing note and vitals reviewed.   Urgent Care Course     Procedures (including critical care time)  Labs Review Labs Reviewed - No data to display  Imaging Review No results found.   Visual Acuity Review  Right Eye Distance:   Left Eye Distance:   Bilateral Distance:    Right Eye Near:   Left  Eye Near:    Bilateral Near:         MDM   1. Scabies   Apply the cream as directed. Repeat in 7 days. Clean sheets, close and other cloth objects as directed. Benadryl for itching, this may cause drowsiness. For nondrowsy itching he may take Allegra or Zyrtec. Meds ordered this encounter  Medications  . permethrin (ELIMITE) 5 % cream    Sig: Apply from chin down, leave on for 8-14 hours, rinse. Repeat in 1 week    Dispense:  120 g    Refill:  0    Order Specific Question:   Supervising Provider    Answer:   Melynda Ripple [[id number]]        Janne Napoleon, NP 11/08/16 2020

## 2016-11-08 NOTE — ED Triage Notes (Signed)
Pt    Reports   A  Rash     All  Over   Her  Body      That  Itches     She  Reports  Has    Had  The  Rash  X  3   Weeks       She  States  She  May  Have  Contacted   Something    While  Visiting  Her mother  In a  Hospital  In  Another  Part  Of  [place]

## 2016-11-15 ENCOUNTER — Emergency Department
Admission: EM | Admit: 2016-11-15 | Discharge: 2016-11-15 | Disposition: A | Discharge status: Home / Self Care | Payer: BLUE CROSS/BLUE SHIELD | Attending: Student in an Organized Health Care Education/Training Program | Admitting: Student in an Organized Health Care Education/Training Program

## 2016-11-15 ENCOUNTER — Encounter: Payer: Self-pay | Admitting: Emergency Medicine

## 2016-11-15 DIAGNOSIS — L03111 Cellulitis of right axilla: Secondary | ICD-10-CM | POA: Diagnosis not present

## 2016-11-15 DIAGNOSIS — Z79899 Other long term (current) drug therapy: Secondary | ICD-10-CM | POA: Diagnosis not present

## 2016-11-15 DIAGNOSIS — B86 Scabies: Secondary | ICD-10-CM

## 2016-11-15 DIAGNOSIS — E119 Type 2 diabetes mellitus without complications: Secondary | ICD-10-CM | POA: Insufficient documentation

## 2016-11-15 DIAGNOSIS — Z87891 Personal history of nicotine dependence: Secondary | ICD-10-CM | POA: Insufficient documentation

## 2016-11-15 DIAGNOSIS — L03115 Cellulitis of right lower limb: Secondary | ICD-10-CM | POA: Diagnosis not present

## 2016-11-15 DIAGNOSIS — Z794 Long term (current) use of insulin: Secondary | ICD-10-CM | POA: Insufficient documentation

## 2016-11-15 MED ORDER — PERMETHRIN 5 % EX CREA: TOPICAL_CREAM | CUTANEOUS | 2 refills | Status: DC

## 2016-11-15 MED ORDER — LIDOCAINE 5 % EX OINT: 1.0000 "application " | TOPICAL_OINTMENT | CUTANEOUS | 2 refills | Status: DC | PRN

## 2016-11-15 MED ORDER — SULFAMETHOXAZOLE-TRIMETHOPRIM 800-160 MG PO TABS: 1.0000 | ORAL_TABLET | Freq: Two times a day (BID) | ORAL | 0 refills | Status: DC

## 2016-11-15 MED ORDER — TRIAMCINOLONE ACETONIDE 0.1 % EX CREA: 1.0000 "application " | TOPICAL_CREAM | Freq: Four times a day (QID) | CUTANEOUS | 2 refills | Status: DC

## 2016-11-15 NOTE — ED Notes (Signed)
Christiane HaJonathan, PA in triage to see pt

## 2016-11-15 NOTE — ED Triage Notes (Addendum)
Patient ambulatory to triage with steady gait, without difficulty or distress noted; pt reports generalized itchy rash x 3 wks;  Dx with scabies and has used topical cream without relief; also reports absces to right inner thigh x 2 days

## 2016-11-15 NOTE — ED Notes (Signed)
Pt not found in triage 1 upon going to discharge pt; PA states that he did give pt her discharge instructions and rx

## 2016-11-15 NOTE — ED Provider Notes (Signed)
[facility] Emergency Department Provider Note  ____________________________________________  Time seen: Approximately 11:17 PM  I have reviewed the triage vital signs and the nursing notes.   HISTORY  Chief Complaint Rash and Abscess    HPI Pamela Shannon is a 51 y.o. female who presents emergency Department with complaint of bumps within scabies infestation. Patient was diagnosed with scabies and has been placed on permethrin. She has been following same. She reports that areas are very irritated and she has been scratching at same. She notes a raised lesion to the right axilla and right medial thigh. She denies any drainage from areas. She does report that they're red, raised, painful. No history of recurrent skin lesions. Patient has been clinging clothing and bed linens as directed. She denies any fevers or chills, nausea vomiting, diarrhea or constipation. No other complaints at this time.   Past Medical History:  Diagnosis Date  . Back pain   . Diabetes mellitus     Patient Active Problem List   Diagnosis Date Noted  . Restless leg syndrome 07/13/2015  . High triglycerides 02/05/2015  . Type II diabetes mellitus, uncontrolled ([place]) 12/07/2014  . Encounter for IUD insertion 07/07/2011  . VAGINAL BLEEDING 01/28/2007    History reviewed. No pertinent surgical history.  Prior to Admission medications   Medication Sig Start Date End Date Taking? Authorizing Provider  Ascorbic Acid (VITAMIN C) 1000 MG tablet Take 1,000 mg by mouth daily.    Historical Provider, MD  Blood Glucose Monitoring Suppl (ONE TOUCH ULTRA 2) W/DEVICE KIT by Does not apply route.    Historical Provider, MD  buPROPion (WELLBUTRIN SR) 150 MG 12 hr tablet Take 1 tablet (150 mg total) by mouth daily. 05/24/16   Elayne Snare, MD  gabapentin (NEURONTIN) 300 MG capsule Take 1 capsule (300 mg total) by mouth at bedtime. 07/12/15   Elayne Snare, MD  insulin degludec (TRESIBA FLEXTOUCH) 100  UNIT/ML SOPN FlexTouch Pen Inject 0.45 mLs (45 Units total) into the skin daily at 10 pm. 09/18/16   Elayne Snare, MD  INVOKAMET [phone] MG TABS Take 1 tablet by mouth 2 (two) times daily. 11/06/16   Elayne Snare, MD  levonorgestrel (MIRENA) 20 MCG/24HR IUD 1 each by Intrauterine route once.      Historical Provider, MD  lidocaine (XYLOCAINE) 5 % ointment Apply 1 application topically as needed. 11/15/16   Charline Bills Arend Bahl, PA-C  Multiple Vitamins-Minerals (MULTIVITAMIN ADULT PO) Take by mouth.    Historical Provider, MD  Naproxen Sodium (ALEVE) 220 MG CAPS Take 1 capsule by mouth as needed.      Historical Provider, MD  permethrin (ELIMITE) 5 % cream Apply liberally over affected area. Leave on for 8-14 hours. Rinse off by showering. Apply once more in 1 week if necessary. 11/15/16 11/15/17  Charline Bills Rosezetta Balderston, PA-C  SAXENDA 18 MG/3ML SOPN INJECT 3 MG INTO THE SKIN DAILY 09/01/15   Elayne Snare, MD  sulfamethoxazole-trimethoprim (BACTRIM DS,SEPTRA DS) 800-160 MG tablet Take 1 tablet by mouth 2 (two) times daily. 11/15/16   Charline Bills Larina Lieurance, PA-C  triamcinolone cream (KENALOG) 0.1 % Apply 1 application topically 4 (four) times daily. 11/15/16   Charline Bills Elvena Oyer, PA-C    Allergies Patient has no known allergies.  Family History  Problem Relation Age of Onset  . Diabetes Father   . Diabetes Mother   . Fibromyalgia Mother     Social History Social History  Substance Use Topics  . Smoking status: Former Research scientist (life sciences)  .  Smokeless tobacco: Never Used  . Alcohol use Yes     Review of Systems  Constitutional: No fever/chills Eyes: No visual changes. No discharge ENT: No upper respiratory complaints. Cardiovascular: no chest pain. Respiratory: no cough. No SOB. Gastrointestinal: No abdominal pain.  No nausea, no vomiting.  No diarrhea.  No constipation. Musculoskeletal: Negative for musculoskeletal pain. Skin: Positive for scabies skin infection. Positive for erythematous and edematous skin  lesions to the right axilla and right medial thigh. Neurological: Negative for headaches, focal weakness or numbness. 10-point ROS otherwise negative.  ____________________________________________   PHYSICAL EXAM:  VITAL SIGNS: ED Triage Vitals  Enc Vitals Group     BP 11/15/16 2250 132/73     Pulse Rate 11/15/16 2250 (!) 112     Resp 11/15/16 2250 20     Temp 11/15/16 2250 98 F (36.7 C)     Temp Source 11/15/16 2250 Oral     SpO2 11/15/16 2250 97 %     Weight 11/15/16 2248 240 lb (108.9 kg)     Height 11/15/16 2248 '5\' 10"'  (1.778 m)     Head Circumference --      Peak Flow --      Pain Score --      Pain Loc --      Pain Edu? --      Excl. in [place]? --      Constitutional: Alert and oriented. Well appearing and in no acute distress. Eyes: Conjunctivae are normal. PERRL. EOMI. Head: Atraumatic. Neck: No stridor.    Cardiovascular: Normal rate, regular rhythm. Normal S1 and S2.  Good peripheral circulation. Respiratory: Normal respiratory effort without tachypnea or retractions. Lungs CTAB. Good air entry to the bases with no decreased or absent breath sounds. Musculoskeletal: Full range of motion to all extremities. No gross deformities appreciated. Neurologic:  Normal speech and language. No gross focal neurologic deficits are appreciated.  Skin:  Skin is warm, dry and intact. No rash noted. Positive for scabies skin infestation to bilateral axilla, neck skin folds, under bilateral breast, inguinal skin folds. Patient has 2 raised erythematous skin lesions noted to the right axilla and right medial 5. Both are approximately 2 cm in diameter. Both are firm to palpation but not indurated or fluctuant. No drainage. Psychiatric: Mood and affect are normal. Speech and behavior are normal. Patient exhibits appropriate insight and judgement.   ____________________________________________   LABS (all labs ordered are listed, but only abnormal results are displayed)  Labs Reviewed  - No data to display ____________________________________________  EKG   ____________________________________________  RADIOLOGY   No results found.  ____________________________________________    PROCEDURES  Procedure(s) performed:    Procedures    Medications - No data to display   ____________________________________________   INITIAL IMPRESSION / ASSESSMENT AND PLAN / ED COURSE  Pertinent labs & imaging results that were available during my care of the patient were reviewed by me and considered in my medical decision making (see chart for details).  Review of the [place] CSRS was performed in accordance of the [place] prior to dispensing any controlled drugs.     Patient's diagnosis is consistent with scabies skin infection with cellulitis to the axilla and right lower extremity. Patient has known scabies infestation. She is treating these areas were with permethrin as well as appropriate cleaning of clothing and bed linen. Patient has been scratching at areas and likely has introduce bacteria causing cellulitis. At this time, no indication of abscess requiring incision and drainage. Patient  is given prescription for refill of permethrin, topical ointments of lidocaine and triamcinolone for symptom control, oral antibiotics for 2 areas of cellulitis.[name] She will follow-up primary care as needed. Patient is given ED precautions to return to the ED for any worsening or new symptoms.     ____________________________________________  FINAL CLINICAL IMPRESSION(S) / ED DIAGNOSES  Final diagnoses:  Scabies  Cellulitis of right lower extremity  Cellulitis of right axilla      NEW MEDICATIONS STARTED DURING THIS VISIT:  New Prescriptions   LIDOCAINE (XYLOCAINE) 5 % OINTMENT    Apply 1 application topically as needed.   PERMETHRIN (ELIMITE) 5 % CREAM    Apply liberally over affected area. Leave on for 8-14 hours. Rinse off by showering. Apply once more in 1 week if  necessary.   SULFAMETHOXAZOLE-TRIMETHOPRIM (BACTRIM DS,SEPTRA DS) 800-160 MG TABLET    Take 1 tablet by mouth 2 (two) times daily.   TRIAMCINOLONE CREAM (KENALOG) 0.1 %    Apply 1 application topically 4 (four) times daily.        This chart was dictated using voice recognition software/Dragon. Despite best efforts to proofread, errors can occur which can change the meaning. Any change was purely unintentional.    Darletta Moll, PA-C 11/15/16 2320    Merlyn Lot, MD 11/15/16 2355

## 2016-11-20 ENCOUNTER — Encounter: Payer: Self-pay | Admitting: Endocrinology

## 2016-11-20 ENCOUNTER — Ambulatory Visit (INDEPENDENT_AMBULATORY_CARE_PROVIDER_SITE_OTHER): Payer: BLUE CROSS/BLUE SHIELD | Admitting: Endocrinology

## 2016-11-20 VITALS — BP 128/86 | HR 97 | Ht 70.0 in | Wt 240.0 lb

## 2016-11-20 DIAGNOSIS — E6609 Other obesity due to excess calories: Secondary | ICD-10-CM | POA: Diagnosis not present

## 2016-11-20 DIAGNOSIS — Z794 Long term (current) use of insulin: Secondary | ICD-10-CM | POA: Diagnosis not present

## 2016-11-20 DIAGNOSIS — E1165 Type 2 diabetes mellitus with hyperglycemia: Secondary | ICD-10-CM | POA: Diagnosis not present

## 2016-11-20 LAB — POCT GLYCOSYLATED HEMOGLOBIN (HGB A1C): Hemoglobin A1C: 8.2

## 2016-11-20 MED ORDER — BUPROPION HCL ER (XL) 300 MG PO TB24: 300.0000 mg | ORAL_TABLET | Freq: Every day | ORAL | 2 refills | Status: DC

## 2016-11-20 NOTE — Progress Notes (Signed)
Patient ID: Pamela Shannon, female   DOB: [birth year], 51 y.o.   MRN: [id number]           Reason for Appointment: follow-up for Type 2 Diabetes  Referring physician: Ludwig Clarks  History of Present Illness:          Diagnosis: Type 2 diabetes mellitus, date of diagnosis: ?  2011       Past history:  At diagnosis she was having symptoms of marked fatigue, dry mouth but not clear how high her sugars were. She has been on various medications for treating her diabetes, probably metformin to start with. Also around the time of diagnosis she thinks she weighed about 300 pounds. She thinks she was able to lose weight subsequently and about 2 years ago had lost down to nearly 200 pounds All the details are not available of her previous management but she appears to have had progressively worsening requiring multiple agents and additional therapies. She thinks her A1c has been over 9% for about 2 years or more, lab records are not available She probably had been on metformin along with Tanzeum about a year or so ago. Because of inadequate control she was also given Invokamet at some point.  In 9/15 she was switched from Tanzeum to Victoza and started on bedtime Levemir insulin, 20 units  Recent history:  INSULIN regimen: Tresiba 45 units in the morning  On-insulin hypoglycemic drugs the patient is taking are: Invokamet 150/1000 twice a day  Her A1c has gone up to 8.2, previously 7.3  Current management, blood sugar patterns and problems identified:  Blood sugars are poorly controlled.  She has gained 9 pounds since her last visit  Again she is not watching her diet and has not exercised.  She was supposed to start increasing her Wellbutrin to help her with motivation and dietary compliance but she has not done so.  Also has had more recent stress with family illness and dealing with her scabies  She had previously said that her Victoza was too expensive but now she thinks it was not  Blood  sugars are generally higher after meals and aren't consistently high in the morning    Works 4 am to 1 pm         Side effects from medications have been: None Compliance with the medical regimen: Variable  Glucose monitoring:  done <1.   times a day         Glucometer: One Touch       Blood Glucose readings by download:  Mean values apply above for all meters except median for One Touch  PRE-MEAL Fasting Lunch Dinner Bedtime Overall  Glucose range:  1 24-172   176     Mean/median:     159    POST-MEAL PC Breakfast PC Lunch PC Dinner  Glucose range: 242  178  160-303   Mean/median:         Self-care: The diet that the patient has been following is: Variable    Meals: 3 meals per day, evening meal at 5 PM. Breakfast is protein shake, toast or oatmeal, sometimes cereal.   Has snacks with peanut butter crackers    Usually has unsweetened drinks  Exercise: none recently        Dietician visit, most recent: 11/19/14                Weight history: 200-260  Wt Readings from Last 3 Encounters:  11/20/16 240 lb (108.9 kg)  11/15/16  240 lb (108.9 kg)  07/27/16 231 lb (104.8 kg)   Glycemic control:2/16 her A1c was 9.7%    Lab Results  Component Value Date   HGBA1C 7.3 (H) 07/24/2016   HGBA1C 6.8 (H) 04/19/2016   HGBA1C 6.6 (H) 01/31/2016   Lab Results  Component Value Date   MICROALBUR 5.2 (H) 10/29/2015   LDLCALC 89 01/31/2016   CREATININE 0.60 07/24/2016    No visits with results within 1 Week(s) from this visit.  Latest known visit with results is:  Lab on 07/24/2016  Component Date Value Ref Range Status  . Hgb A1c MFr Bld 07/24/2016 7.3* 4.6 - 6.5 % Final  . Sodium 07/24/2016 139  135 - 145 mEq/L Final  . Potassium 07/24/2016 4.3  3.5 - 5.1 mEq/L Final  . Chloride 07/24/2016 102  96 - 112 mEq/L Final  . CO2 07/24/2016 29  19 - 32 mEq/L Final  . Glucose, Bld 07/24/2016 166* 70 - 99 mg/dL Final  . BUN 07/24/2016 19  6 - 23 mg/dL Final  . Creatinine, Ser  07/24/2016 0.60  0.40 - 1.20 mg/dL Final  . Calcium 07/24/2016 9.7  8.4 - 10.5 mg/dL Final  . GFR 07/24/2016 112.16  >60.00 mL/min Final      Allergies as of 11/20/2016   No Known Allergies     Medication List       Accurate as of 11/20/16 12:44 PM. Always use your most recent med list.          ALEVE 220 MG Caps Generic drug:  Naproxen Sodium Take 1 capsule by mouth as needed.   buPROPion 150 MG 12 hr tablet Commonly known as:  WELLBUTRIN SR Take 1 tablet (150 mg total) by mouth daily.   gabapentin 300 MG capsule Commonly known as:  NEURONTIN Take 1 capsule (300 mg total) by mouth at bedtime.   insulin degludec 100 UNIT/ML Sopn FlexTouch Pen Commonly known as:  TRESIBA FLEXTOUCH Inject 0.45 mLs (45 Units total) into the skin daily at 10 pm.   INVOKAMET [phone] MG Tabs Generic drug:  Canagliflozin-Metformin HCl Take 1 tablet by mouth 2 (two) times daily.   levonorgestrel 20 MCG/24HR IUD Commonly known as:  MIRENA 1 each by Intrauterine route once.   lidocaine 5 % ointment Commonly known as:  XYLOCAINE Apply 1 application topically as needed.   MULTIVITAMIN ADULT PO Take by mouth.   ONE TOUCH ULTRA 2 w/Device Kit by Does not apply route.   permethrin 5 % cream Commonly known as:  ELIMITE Apply liberally over affected area. Leave on for 8-14 hours. Rinse off by showering. Apply once more in 1 week if necessary.   sulfamethoxazole-trimethoprim 800-160 MG tablet Commonly known as:  BACTRIM DS,SEPTRA DS Take 1 tablet by mouth 2 (two) times daily.   triamcinolone cream 0.1 % Commonly known as:  KENALOG Apply 1 application topically 4 (four) times daily.   vitamin C 1000 MG tablet Take 1,000 mg by mouth daily.       Allergies: No Known Allergies  Past Medical History:  Diagnosis Date  . Back pain   . Diabetes mellitus     No past surgical history on file.  Family History  Problem Relation Age of Onset  . Diabetes Father   . Diabetes Mother     . Fibromyalgia Mother     Social History:  reports that she has quit smoking. She has never used smokeless tobacco. She reports that she drinks alcohol. She reports that she  does not use drugs.    Review of Systems    She Is on Wellbutrin 150 mg SR both for improving her appetite and mood She was told to increase it to 2 tablets daily but she did not do so on her last visit       Lipids: LDL below 100, Needs follow-up      Lab Results  Component Value Date   CHOL 157 01/31/2016   HDL 35.80 (L) 01/31/2016   LDLCALC 89 01/31/2016   LDLDIRECT 90.0 02/02/2015   TRIG 163.0 (H) 01/31/2016   CHOLHDL 4 01/31/2016                   She has minimal history of Numbness, tingling or rarely burning in feet.    She may take gabapentin as needed for restless legs at night occasionally  She recently had problems with severe generalized scabies apparently and still has not had resolution of her lesions and itching  Physical Examination:  BP 128/86   Pulse 97   Ht '5\' 10"'$  (1.778 m)   Wt 240 lb (108.9 kg)   BMI 34.44 kg/m      ASSESSMENT/PLAN:  Diabetes type 2, uncontrolled with obesity and BMI 36 See history of present illness for current management, blood sugar patterns and problems identified  Her blood sugars are getting higher and A1c is 8.2 She has again gained some weight. She is still not able to watch her portions and this is partly related to stress eating also Has not been able to exercise at all  ?  HYPERTENSION: Her blood pressure is relatively high but may improve with consistent diet, exercise and weight loss  Recommendations:  Start Ozempic 0.25 mg today and then 0.5 mg weekly from next week.  Discussed that this is a GLP-1 drug and explained how this works and is a friend from insulin.  Most likely she may be able to get improve satiety and better blood sugar control including fasting with this  She will call for a prescription for the 0.5 mg weekly once her  sample is finished, may consider higher dose on the next visit also if needed  Wellbutrin 300 mg daily.  Initially try 2 tablets of the 150s that she has at home.  More consistent glucose monitoring after meals  Regular exercise for weight loss.  She refuses to see the dietitian she thinks it did not help before  To check more readings after meals  Also reminded her to check her blood sugar before starting driving at her work as required  Will review her blood sugars again in 2 months.  Stay with Tyler Aas 45 units for now and Invokamet twice a day   Patient Instructions  Resume diet  Start Ozempic 0.'25mg'$  with the pen as shown once weekly on the same day of the week.  SECOND INJECTION IS 0.'5MG'$ , NEW RX NEEDED IN 1 MONTH   You may inject in the stomach, thigh or arm as indicated in the brochure given.  You will feel fullness of the stomach with starting the medication and should try to keep the portions at meals small.   Bupropion 2 pills in am         Counseling time on subjects discussed above is over 50% of today's 25 minute visit    Pamela Shannon 11/20/2016, 12:44 PM   Note: This office note was prepared with Estate agent. Any transcriptional errors that result from this process are  unintentional.

## 2016-11-20 NOTE — Patient Instructions (Signed)
Resume diet  Start Ozempic 0.25mg  with the pen as shown once weekly on the same day of the week.  SECOND INJECTION IS 0.5MG , NEW RX NEEDED IN 1 MONTH   You may inject in the stomach, thigh or arm as indicated in the brochure given.  You will feel fullness of the stomach with starting the medication and should try to keep the portions at meals small.   Bupropion 2 pills in am

## 2016-11-20 NOTE — Addendum Note (Signed)
Addended by: Bobbye Riggs on: 11/20/2016 01:54 PM   Modules accepted: Orders

## 2016-11-27 ENCOUNTER — Telehealth: Payer: Self-pay | Admitting: Endocrinology

## 2016-11-27 NOTE — Telephone Encounter (Signed)
This was faxed 11/21/16 and patient notified

## 2016-11-27 NOTE — Telephone Encounter (Signed)
Pt called in to see if her [employer] paper work had been faxed in or not.  Please advise and call patient.

## 2016-12-01 ENCOUNTER — Other Ambulatory Visit: Payer: Self-pay

## 2016-12-01 ENCOUNTER — Telehealth: Payer: Self-pay | Admitting: Endocrinology

## 2016-12-01 MED ORDER — BUPROPION HCL ER (XL) 300 MG PO TB24: 300.0000 mg | ORAL_TABLET | Freq: Every day | ORAL | 2 refills | Status: DC

## 2016-12-01 NOTE — Telephone Encounter (Signed)
Patient stated she wanted her medication buPROPion (WELLBUTRIN XL) 300 MG 24 hr tablet  Sent to   [facility] [address] ([address]), [place] - [address] [phone] (Phone) [phone] (Fax)

## 2016-12-01 NOTE — Telephone Encounter (Signed)
Ordered

## 2016-12-25 ENCOUNTER — Other Ambulatory Visit (INDEPENDENT_AMBULATORY_CARE_PROVIDER_SITE_OTHER): Payer: BLUE CROSS/BLUE SHIELD

## 2016-12-25 DIAGNOSIS — Z794 Long term (current) use of insulin: Secondary | ICD-10-CM | POA: Diagnosis not present

## 2016-12-25 DIAGNOSIS — E1165 Type 2 diabetes mellitus with hyperglycemia: Secondary | ICD-10-CM

## 2016-12-25 LAB — MICROALBUMIN / CREATININE URINE RATIO
Creatinine,U: 58.8 mg/dL
Microalb Creat Ratio: 13.3 mg/g (ref 0.0–30.0)
Microalb, Ur: 7.8 mg/dL — ABNORMAL HIGH (ref 0.0–1.9)

## 2016-12-25 LAB — BASIC METABOLIC PANEL
BUN: 14 mg/dL (ref 6–23)
CO2: 26 meq/L (ref 19–32)
Calcium: 9.6 mg/dL (ref 8.4–10.5)
Chloride: 102 mEq/L (ref 96–112)
Creatinine, Ser: 0.59 mg/dL (ref 0.40–1.20)
GFR: 114.17 mL/min (ref >60.00)
Glucose, Bld: 172 mg/dL — ABNORMAL HIGH (ref 70–99)
Potassium: 4 mEq/L (ref 3.5–5.1)
SODIUM: 137 meq/L (ref 135–145)

## 2016-12-25 LAB — LIPID PANEL
CHOL/HDL RATIO: 4
Cholesterol: 164 mg/dL (ref 0–200)
HDL: 38.3 mg/dL — ABNORMAL LOW (ref >39.00)
NONHDL: 125.94
TRIGLYCERIDES: 203 mg/dL — AB (ref 0.0–149.0)
VLDL: 40.6 mg/dL — AB (ref 0.0–40.0)

## 2016-12-25 LAB — LDL CHOLESTEROL, DIRECT: Direct LDL: 99 mg/dL

## 2016-12-26 LAB — FRUCTOSAMINE: FRUCTOSAMINE: 299 umol/L — AB (ref 0–285)

## 2017-01-01 ENCOUNTER — Ambulatory Visit (INDEPENDENT_AMBULATORY_CARE_PROVIDER_SITE_OTHER): Payer: BLUE CROSS/BLUE SHIELD | Admitting: Endocrinology

## 2017-01-01 ENCOUNTER — Encounter: Payer: Self-pay | Admitting: Endocrinology

## 2017-01-01 VITALS — BP 130/82 | HR 108 | Ht 70.0 in | Wt 241.4 lb

## 2017-01-01 DIAGNOSIS — Z794 Long term (current) use of insulin: Secondary | ICD-10-CM | POA: Diagnosis not present

## 2017-01-01 DIAGNOSIS — E1165 Type 2 diabetes mellitus with hyperglycemia: Secondary | ICD-10-CM

## 2017-01-01 MED ORDER — INVOKAMET 150-1000 MG PO TABS: 1.0000 | ORAL_TABLET | Freq: Two times a day (BID) | ORAL | 2 refills | Status: DC

## 2017-01-01 MED ORDER — SEMAGLUTIDE(0.25 OR 0.5MG/DOS) 2 MG/1.5ML ~~LOC~~ SOPN: 0.5000 mg | PEN_INJECTOR | SUBCUTANEOUS | 3 refills | Status: DC

## 2017-01-01 NOTE — Progress Notes (Signed)
Patient ID: Pamela Shannon, female   DOB: 10-11-1965, 51 y.o.   MRN: [id number]           Reason for Appointment: follow-up for Type 2 Diabetes  Referring physician: Mellody Drown  History of Present Illness:          Diagnosis: Type 2 diabetes mellitus, date of diagnosis: ?  2011       Past history:  At diagnosis she was having symptoms of marked fatigue, dry mouth but not clear how high her sugars were. She has been on various medications for treating her diabetes, probably metformin to start with. Also around the time of diagnosis she thinks she weighed about 300 pounds. She thinks she was able to lose weight subsequently and about 2 years ago had lost down to nearly 200 pounds All the details are not available of her previous management but she appears to have had progressively worsening requiring multiple agents and additional therapies. She thinks her A1c has been over 9% for about 2 years or more, lab records are not available She probably had been on metformin along with Tanzeum about a year or so ago. Because of inadequate control she was also given Invokamet at some point.  In 9/15 she was switched from Tanzeum to [place] and started on bedtime Levemir insulin, 20 units  Recent history:  INSULIN regimen: Tresiba 45 units in the morning  Non- hypoglycemic drugs the patient is taking are: Invokamet 150/1000 twice a day, Ozempic 0.5 mg weekly  Her A1c has gone up to 8.2 in April, previously 7.3 Fructosamine is 299 now  Current management, blood sugar patterns and problems identified:  She was started on Ozempic instead of Victoza about 6 weeks ago  She thinks this works better and she is able to cut back on portions especially at lunchtime better  However she did not take her last dose yesterday and did not keep her co-pay card as instructed  She also thinks her sugars may have been higher previously because of various stresses  She does not like to exercise as she thinks she is  not getting enough time for this  She does not remember her blood sugars well and did not bring her monitor, lab glucose was 172 postprandially  However she thinks her FASTING readings are fairly consistent and has only sporadic high readings after meals   Still not able to lose weight   Works 4 am to 1 pm         Side effects from medications have been: None Compliance with the medical regimen: Variable  Glucose monitoring:  done <1.   times a day         Glucometer: One Touch       Blood Glucose readings by recall.  Am 120-134  PC upto 238     Self-care: The diet that the patient has been following is: Variable    Meals: 3 meals per day, evening meal at 5 PM. Breakfast is protein shake, toast or oatmeal, sometimes cereal.   Has snacks with peanut butter crackers    Usually has unsweetened drinks  Exercise: none         Dietician visit, most recent: 11/19/14                Weight history: 200-260  Wt Readings from Last 3 Encounters:  01/01/17 241 lb 6.4 oz (109.5 kg)  11/20/16 240 lb (108.9 kg)  11/15/16 240 lb (108.9 kg)   Glycemic control:2/16 her  A1c was 9.7%    Lab Results  Component Value Date   HGBA1C 8.2 11/20/2016   HGBA1C 7.3 (H) 07/24/2016   HGBA1C 6.8 (H) 04/19/2016   Lab Results  Component Value Date   MICROALBUR 7.8 (H) 12/25/2016   LDLCALC 89 01/31/2016   CREATININE 0.59 12/25/2016    No visits with results within 1 Week(s) from this visit.  Latest known visit with results is:  Lab on 12/25/2016  Component Date Value Ref Range Status  . Sodium 12/25/2016 137  135 - 145 mEq/L Final  . Potassium 12/25/2016 4.0  3.5 - 5.1 mEq/L Final  . Chloride 12/25/2016 102  96 - 112 mEq/L Final  . CO2 12/25/2016 26  19 - 32 mEq/L Final  . Glucose, Bld 12/25/2016 172* 70 - 99 mg/dL Final  . BUN 12/25/2016 14  6 - 23 mg/dL Final  . Creatinine, Ser 12/25/2016 0.59  0.40 - 1.20 mg/dL Final  . Calcium 12/25/2016 9.6  8.4 - 10.5 mg/dL Final  . GFR  12/25/2016 114.17  >60.00 mL/min Final  . Fructosamine 12/25/2016 299* 0 - 285 umol/L Final   Comment: Published reference interval for apparently healthy subjects between age 72 and 49 is 54 - 285 umol/L and in a poorly controlled diabetic population is 228 - 563 umol/L with a mean of 396 umol/L.   [name] Cholesterol 12/25/2016 164  0 - 200 mg/dL Final   ATP III Classification       Desirable:  < 200 mg/dL               Borderline High:  200 - 239 mg/dL          High:  > = 240 mg/dL  . Triglycerides 12/25/2016 203.0* 0.0 - 149.0 mg/dL Final   Normal:  <150 mg/dLBorderline High:  150 - 199 mg/dL  . HDL 12/25/2016 38.30* >39.00 mg/dL Final  . VLDL 12/25/2016 40.6* 0.0 - 40.0 mg/dL Final  . Total CHOL/HDL Ratio 12/25/2016 4   Final                  Men          Women1/2 Average Risk     3.4          3.3Average Risk          5.0          4.42X Average Risk          9.6          7.13X Average Risk          15.0          11.0                      . NonHDL 12/25/2016 125.94   Final   NOTE:  Non-HDL goal should be 30 mg/dL higher than patient's LDL goal (i.e. LDL goal of < 70 mg/dL, would have non-HDL goal of < 100 mg/dL)  . Microalb, Ur 12/25/2016 7.8* 0.0 - 1.9 mg/dL Final  . Creatinine,U 12/25/2016 58.8  mg/dL Final  . Microalb Creat Ratio 12/25/2016 13.3  0.0 - 30.0 mg/g Final  . Direct LDL 12/25/2016 99.0  mg/dL Final   Optimal:  <100 mg/dLNear or Above Optimal:  100-129 mg/dLBorderline High:  130-159 mg/dLHigh:  160-189 mg/dLVery High:  >190 mg/dL      Allergies as of 01/01/2017   No Known Allergies     Medication List  Accurate as of 01/01/17  9:44 PM. Always use your most recent med list.          ALEVE 220 MG Caps Generic drug:  Naproxen Sodium Take 1 capsule by mouth as needed.   buPROPion 300 MG 24 hr tablet Commonly known as:  WELLBUTRIN XL Take 1 tablet (300 mg total) by mouth daily.   gabapentin 300 MG capsule Commonly known as:  NEURONTIN Take 1 capsule (300 mg  total) by mouth at bedtime.   insulin degludec 100 UNIT/ML Sopn FlexTouch Pen Commonly known as:  TRESIBA FLEXTOUCH Inject 0.45 mLs (45 Units total) into the skin daily at 10 pm.   INVOKAMET [phone] MG Tabs Generic drug:  Canagliflozin-Metformin HCl Take 1 tablet by mouth 2 (two) times daily.   levonorgestrel 20 MCG/24HR IUD Commonly known as:  MIRENA 1 each by Intrauterine route once.   MULTIVITAMIN ADULT PO Take by mouth.   ONE TOUCH ULTRA 2 w/Device Kit by Does not apply route.   Semaglutide 0.25 or 0.5 MG/DOSE Sopn Commonly known as:  OZEMPIC Inject 0.5 mg into the skin once a week.   vitamin C 1000 MG tablet Take 1,000 mg by mouth daily.       Allergies: No Known Allergies  Past Medical History:  Diagnosis Date  . Back pain   . Diabetes mellitus     No past surgical history on file.  Family History  Problem Relation Age of Onset  . Diabetes Father   . Diabetes Mother   . Fibromyalgia Mother     Social History:  reports that she has quit smoking. She has never used smokeless tobacco. She reports that she drinks alcohol. She reports that she does not use drugs.    Review of Systems    She Is on Wellbutrin 300 mg SR both for improving her appetite and mood, She thinks she is doing better         Lipids: LDL below 100, This is not consistent      Lab Results  Component Value Date   CHOL 164 12/25/2016   HDL 38.30 (L) 12/25/2016   LDLCALC 89 01/31/2016   LDLDIRECT 99.0 12/25/2016   TRIG 203.0 (H) 12/25/2016   CHOLHDL 4 12/25/2016                   She has Insignificant history of Numbness, tingling or rarely burning in feet.    She Takes gabapentin as needed for restless legs at night occasionally   Physical Examination:  BP 130/82   Pulse (!) 108   Ht '5\' 10"'  (1.778 m)   Wt 241 lb 6.4 oz (109.5 kg)   SpO2 97%   BMI 34.64 kg/m      ASSESSMENT/PLAN:  Diabetes type 2, uncontrolled with obesity and BMI 36 See history of present  illness for current management, blood sugar patterns and problems identified  Her blood sugars are appearing somewhat better but still not controlled with fructosamine 299 Previous A1c 8.2 Even with Ozempic and cutting back on portions she still has not lost any weight Has not been motivated to find time for exercise Not clear what her postprandial readings are as she did not bring her monitor  Recommendations:  Continue Ozempic 0.5 mg, new co-pay card given since she is not getting coverage from her insurance on this  Consider 1 mg weekly on the next visit if A1c is not improved and she is not getting post prandial control  Start regular  exercise   Patient Instructions  Ozempic 0.71m weekly   Check blood sugars on waking up    Also check blood sugars about 2 hours after a meal and do this after different meals by rotation  Recommended blood sugar levels on waking up is 90-130 and about 2 hours after meal is 130-160  Please bring your blood sugar monitor to each visit, thank you         [facility]5/14/2018, 9:44 PM   Note: This office note was prepared with Dragon voice recognition system technology. Any transcriptional errors that result from this process are unintentional.

## 2017-01-01 NOTE — Patient Instructions (Signed)
Ozempic 0.5mg  weekly   Check blood sugars on waking up    Also check blood sugars about 2 hours after a meal and do this after different meals by rotation  Recommended blood sugar levels on waking up is 90-130 and about 2 hours after meal is 130-160  Please bring your blood sugar monitor to each visit, thank you

## 2017-01-12 ENCOUNTER — Telehealth: Payer: Self-pay

## 2017-01-12 NOTE — Telephone Encounter (Signed)
Called patient and left a voice message for her to use her copay card as her insurance to receive her Ozempic.

## 2017-01-25 ENCOUNTER — Telehealth: Payer: Self-pay | Admitting: Endocrinology

## 2017-01-25 ENCOUNTER — Other Ambulatory Visit: Payer: Self-pay

## 2017-01-25 MED ORDER — SEMAGLUTIDE(0.25 OR 0.5MG/DOS) 2 MG/1.5ML ~~LOC~~ SOPN: 0.5000 mg | PEN_INJECTOR | SUBCUTANEOUS | 3 refills | Status: DC

## 2017-01-25 NOTE — Telephone Encounter (Signed)
Called patient [facility] let her know [facility] [address]

## 2017-01-25 NOTE — Telephone Encounter (Signed)
Patient called to request Semaglutide (OZEMPIC) 0.25 or 0.5 MG/DOSE SOPN [[id number]][[id number]]   be [facility] to the [facility] [address]

## 2017-02-05 ENCOUNTER — Ambulatory Visit: Payer: BLUE CROSS/BLUE SHIELD | Admitting: Family

## 2017-02-09 ENCOUNTER — Ambulatory Visit (INDEPENDENT_AMBULATORY_CARE_PROVIDER_SITE_OTHER): Payer: BLUE CROSS/BLUE SHIELD | Admitting: Family

## 2017-02-09 ENCOUNTER — Encounter: Payer: Self-pay | Admitting: Family

## 2017-02-09 DIAGNOSIS — M009 Pyogenic arthritis, unspecified: Secondary | ICD-10-CM | POA: Insufficient documentation

## 2017-02-09 MED ORDER — CLINDAMYCIN HCL 300 MG PO CAPS: 300.0000 mg | ORAL_CAPSULE | Freq: Four times a day (QID) | ORAL | 0 refills | Status: DC

## 2017-02-09 NOTE — Progress Notes (Signed)
Subjective:    Patient ID: Pamela Shannon, female    DOB: 1966-08-13, 51 y.o.   MRN: [id number]  Chief Complaint  Patient presents with  . Establish Care  . Arm Injury    right lower arm swelling    HPI:  Pamela Shannon is a 51 y.o. female who  has a past medical history of Back pain and Diabetes mellitus. and presents today for an office visit to establish care.  Arm pain - This is a new problem. Associated symptom of pain located in her right elbow has been going on for about 1 week. Described as red and swollen. No fevers. Denies trauma or injury to the area. Modifying factors include rubbing it with alcohol. Aggravated by warm showers. No numbness or tingling.  Right hand dominant. No weakness.   No Known Allergies    Outpatient Medications Prior to Visit  Medication Sig Dispense Refill  . Ascorbic Acid (VITAMIN C) 1000 MG tablet Take 1,000 mg by mouth daily.    . Blood Glucose Monitoring Suppl (ONE TOUCH ULTRA 2) W/DEVICE KIT by Does not apply route.    [name] buPROPion (WELLBUTRIN XL) 300 MG 24 hr tablet Take 1 tablet (300 mg total) by mouth daily. 30 tablet 2  . gabapentin (NEURONTIN) 300 MG capsule Take 1 capsule (300 mg total) by mouth at bedtime. 30 capsule 3  . insulin degludec (TRESIBA FLEXTOUCH) 100 UNIT/ML SOPN FlexTouch Pen Inject 0.45 mLs (45 Units total) into the skin daily at 10 pm. 15 mL 2  . INVOKAMET [id number] MG TABS Take 1 tablet by mouth 2 (two) times daily. 60 tablet 2  . levonorgestrel (MIRENA) 20 MCG/24HR IUD 1 each by Intrauterine route once.      . Multiple Vitamins-Minerals (MULTIVITAMIN ADULT PO) Take by mouth.    . Naproxen Sodium (ALEVE) 220 MG CAPS Take 1 capsule by mouth as needed.      . Semaglutide (OZEMPIC) 0.25 or 0.5 MG/DOSE SOPN Inject 0.5 mg into the skin once a week. 1 pen 3   No facility-administered medications prior to visit.       History reviewed. No pertinent surgical history.    Past Medical History:  Diagnosis Date  . Back pain   .  Diabetes mellitus       Review of Systems  Constitutional: Negative for chills and fever.  Respiratory: Negative for chest tightness and shortness of breath.   Musculoskeletal:       Positive for right elbow pain.  Neurological: Negative for weakness and numbness.      Objective:    BP 124/78 (BP Location: Left Arm, Patient Position: Sitting, Cuff Size: Normal)   Pulse (!) 101   Temp 98.6 F (37 C) (Oral)   Ht _0  (1.778 m)   Wt 237 lb (107.5 kg)   SpO2 99%   BMI 34.01 kg/m  Nursing note and vital signs reviewed.  Physical Exam  Constitutional: She is oriented to person, place, and time. She appears well-developed and well-nourished. No distress.  Cardiovascular: Normal rate, regular rhythm, normal heart sounds and intact distal pulses.   Pulmonary/Chest: Effort normal and breath sounds normal.  Musculoskeletal:  Right elbow - Obvious redness and edema with no deformity. Palpable edema and warmth with diffuse tenderness. Range of motion and strength are appropriate. Distal pulses and sensation are intact and appropriate. No open wounds or injury noted.   Neurological: She is alert and oriented to person, place, and time.  Skin: Skin is warm  and dry.  Psychiatric: She has a normal mood and affect. Her behavior is normal. Judgment and thought content normal.       Assessment & Plan:   Problem List Items Addressed This Visit      Other   Infection of elbow ([place])    New onset arm pain with concern for infection of the bursa and possibly the elbow joint. Referral placed to orthopedics. Start clindamycin. Patient advised to have a low threshold to seek further care pending referral to orthopedics.       Relevant Medications   clindamycin (CLEOCIN) 300 MG capsule   Other Relevant Orders   AMB referral to orthopedics       I am having Ms. Soltis start on clindamycin. I am also having her maintain her Naproxen Sodium, levonorgestrel, ONE TOUCH ULTRA 2, vitamin C,  Multiple Vitamins-Minerals (MULTIVITAMIN ADULT PO), gabapentin, insulin degludec, buPROPion, INVOKAMET, and Semaglutide.   Meds ordered this encounter  Medications  . clindamycin (CLEOCIN) 300 MG capsule    Sig: Take 1 capsule (300 mg total) by mouth 4 (four) times daily.    Dispense:  40 capsule    Refill:  0    Order Specific Question:   Supervising Provider    Answer:   Pricilla Holm A [[id number]]     Follow-up: Return if symptoms worsen or fail to improve.  Mauricio Po, FNP

## 2017-02-09 NOTE — Assessment & Plan Note (Signed)
New onset arm pain with concern for infection of the bursa and possibly the elbow joint. Referral placed to orthopedics. Start clindamycin. Patient advised to have a low threshold to seek further care pending referral to orthopedics.

## 2017-02-09 NOTE — Patient Instructions (Addendum)
Thank you for choosing [facility].  SUMMARY AND INSTRUCTIONS:  Please start the clindamycine 4x per day for the infection.  [facility] [address], [place] [address]  Medication:  Your prescription(s) have been submitted to your pharmacy or been printed and provided for you. Please take as directed and contact our office if you believe you are having problem(s) with the medication(s) or have any questions.  Follow up:  If your symptoms worsen or fail to improve, please contact our office for further instruction, or in case of emergency go directly to the emergency room at the closest medical facility.

## 2017-02-12 ENCOUNTER — Other Ambulatory Visit (HOSPITAL_COMMUNITY): Payer: Self-pay | Admitting: Orthopedic Surgery

## 2017-02-12 DIAGNOSIS — R52 Pain, unspecified: Secondary | ICD-10-CM

## 2017-02-13 ENCOUNTER — Ambulatory Visit (HOSPITAL_COMMUNITY)
Admission: RE | Admit: 2017-02-13 | Discharge: 2017-02-13 | Disposition: A | Discharge status: Home / Self Care | Payer: BLUE CROSS/BLUE SHIELD | Source: Ambulatory Visit | Attending: Family | Admitting: Family

## 2017-02-13 DIAGNOSIS — M79603 Pain in arm, unspecified: Secondary | ICD-10-CM | POA: Diagnosis present

## 2017-02-13 DIAGNOSIS — M7989 Other specified soft tissue disorders: Secondary | ICD-10-CM | POA: Insufficient documentation

## 2017-02-13 DIAGNOSIS — R52 Pain, unspecified: Secondary | ICD-10-CM | POA: Diagnosis not present

## 2017-02-13 NOTE — Progress Notes (Signed)
VASCULAR LAB PRELIMINARY  PRELIMINARY  PRELIMINARY  PRELIMINARY  Right upper extremity venous duplex completed.    Preliminary report:  Right :  No evidence of DVT or superficial thrombosis.    Pamela Shannon, RVS 02/13/2017, 3:36 PM

## 2017-02-15 SURGERY — Surgical Case
Anesthesia: *Unknown

## 2017-03-02 ENCOUNTER — Other Ambulatory Visit (INDEPENDENT_AMBULATORY_CARE_PROVIDER_SITE_OTHER): Payer: BLUE CROSS/BLUE SHIELD

## 2017-03-02 DIAGNOSIS — E1165 Type 2 diabetes mellitus with hyperglycemia: Secondary | ICD-10-CM | POA: Diagnosis not present

## 2017-03-02 DIAGNOSIS — Z794 Long term (current) use of insulin: Secondary | ICD-10-CM

## 2017-03-02 LAB — COMPREHENSIVE METABOLIC PANEL
ALK PHOS: 62 U/L (ref 39–117)
ALT: 25 U/L (ref 0–35)
AST: 19 U/L (ref 0–37)
Albumin: 4.1 g/dL (ref 3.5–5.2)
BILIRUBIN TOTAL: 0.6 mg/dL (ref 0.2–1.2)
BUN: 13 mg/dL (ref 6–23)
CALCIUM: 9.4 mg/dL (ref 8.4–10.5)
CO2: 26 mEq/L (ref 19–32)
Chloride: 101 mEq/L (ref 96–112)
Creatinine, Ser: 0.64 mg/dL (ref 0.40–1.20)
GFR: 103.86 mL/min (ref >60.00)
Glucose, Bld: 161 mg/dL — ABNORMAL HIGH (ref 70–99)
Potassium: 4.3 mEq/L (ref 3.5–5.1)
Sodium: 137 mEq/L (ref 135–145)
Total Protein: 7.7 g/dL (ref 6.0–8.3)

## 2017-03-02 LAB — HEMOGLOBIN A1C: Hgb A1c MFr Bld: 7 % — ABNORMAL HIGH (ref 4.6–6.5)

## 2017-03-05 ENCOUNTER — Other Ambulatory Visit: Payer: BLUE CROSS/BLUE SHIELD

## 2017-03-09 ENCOUNTER — Other Ambulatory Visit: Payer: BLUE CROSS/BLUE SHIELD

## 2017-03-12 ENCOUNTER — Ambulatory Visit: Payer: BLUE CROSS/BLUE SHIELD | Admitting: Endocrinology

## 2017-03-15 NOTE — Progress Notes (Signed)
Patient ID: Pamela Shannon, female   DOB: [birth year], 51 y.o.   MRN: [id number]           Reason for Appointment: follow-up for Type 2 Diabetes  Referring physician: Mellody Drown  History of Present Illness:          Diagnosis: Type 2 diabetes mellitus, date of diagnosis: ?  2011       Past history:  At diagnosis she was having symptoms of marked fatigue, dry mouth but not clear how high her sugars were. She has been on various medications for treating her diabetes, probably metformin to start with. Also around the time of diagnosis she thinks she weighed about 300 pounds. She thinks she was able to lose weight subsequently and about 2 years ago had lost down to nearly 200 pounds All the details are not available of her previous management but she appears to have had progressively worsening requiring multiple agents and additional therapies. She thinks her A1c has been over 9% for about 2 years or more, lab records are not available She probably had been on metformin along with Tanzeum about a year or so ago. Because of inadequate control she was also given Invokamet at some point.  In 9/15 she was switched from Tanzeum to [place] and started on bedtime Levemir insulin, 20 units  Recent history:  INSULIN regimen: Tresiba 45 units in the morning  Non- hypoglycemic drugs the patient is taking are: Invokamet 150/1000 twice a day, Ozempic 0.5 mg weekly  Her A1c has come down to 7% previously had gone up to 8.2 in April  Current management, blood sugar patterns and problems identified:   She was started on Ozempic instead of Victoza in April because of poor control  She appears to have overall much better blood sugar control  Also she thinks she has been able to do better with her diet with watching her meals, portions, carbohydrates and snacks  Although she had lost 4 pounds she has gone off her diet in the last week or 2 with going on vacation and starting to eat sweets again  She does  not make any efforts to exercise and she thinks she is working long hours or otherwise busy  Checking blood sugars somewhat sporadically also and not everyday  FASTING blood sugars are recently higher when she went off her diet but otherwise had been usually below 140  Postprandial readings have been monitored erratically and few recently  Most of her high readings are when she goes off her diet and otherwise in the evenings usually has readings under 150  She is not having any side effects with Ozempic which she takes every week consistently     Side effects from medications have been: None Compliance with the medical regimen: Variable  Glucose monitoring:  done <1.   times a day         Glucometer: One Touch       Blood Glucose readings by   Mean values apply above for all meters except median for One Touch  PRE-MEAL Fasting Lunch Dinner Bedtime Overall  Glucose range: 1 22-200       Mean/median: 149     146+/-29    POST-MEAL PC Breakfast PC Lunch PC Dinner  Glucose range:  74  1 25-235   Mean/median:         Self-care: The diet that the patient has been following is: Variable    Meals: 3 meals per day, evening meal at 5  PM. Breakfast is protein shake, toast or oatmeal, sometimes cereal.   Has snacks with peanut butter crackers   Usually has unsweetened drinks  Exercise: none         Dietician visit, most recent: 11/19/14                Weight history: 200-260  Wt Readings from Last 3 Encounters:  03/16/17 240 lb 12.8 oz (109.2 kg)  02/09/17 237 lb (107.5 kg)  01/01/17 241 lb 6.4 oz (109.5 kg)      Lab Results  Component Value Date   HGBA1C 7.0 (H) 03/02/2017   HGBA1C 8.2 11/20/2016   HGBA1C 7.3 (H) 07/24/2016   Lab Results  Component Value Date   MICROALBUR 7.8 (H) 12/25/2016   LDLCALC 89 01/31/2016   CREATININE 0.64 03/02/2017    No visits with results within 1 Week(s) from this visit.  Latest known visit with results is:  Lab on 03/02/2017  Component  Date Value Ref Range Status  . Hgb A1c MFr Bld 03/02/2017 7.0* 4.6 - 6.5 % Final   Glycemic Control Guidelines for People with Diabetes:Non Diabetic:  <6%Goal of Therapy: <7%Additional Action Suggested:  >8%   . Sodium 03/02/2017 137  135 - 145 mEq/L Final  . Potassium 03/02/2017 4.3  3.5 - 5.1 mEq/L Final  . Chloride 03/02/2017 101  96 - 112 mEq/L Final  . CO2 03/02/2017 26  19 - 32 mEq/L Final  . Glucose, Bld 03/02/2017 161* 70 - 99 mg/dL Final  . BUN 03/02/2017 13  6 - 23 mg/dL Final  . Creatinine, Ser 03/02/2017 0.64  0.40 - 1.20 mg/dL Final  . Total Bilirubin 03/02/2017 0.6  0.2 - 1.2 mg/dL Final  . Alkaline Phosphatase 03/02/2017 62  39 - 117 U/L Final  . AST 03/02/2017 19  0 - 37 U/L Final  . ALT 03/02/2017 25  0 - 35 U/L Final  . Total Protein 03/02/2017 7.7  6.0 - 8.3 g/dL Final  . Albumin 03/02/2017 4.1  3.5 - 5.2 g/dL Final  . Calcium 03/02/2017 9.4  8.4 - 10.5 mg/dL Final  . GFR 03/02/2017 103.86  >60.00 mL/min Final      Allergies as of 03/16/2017   No Known Allergies     Medication List       Accurate as of 03/16/17 11:27 AM. Always use your most recent med list.          ALEVE 220 MG Caps Generic drug:  Naproxen Sodium Take 1 capsule by mouth as needed.   buPROPion 300 MG 24 hr tablet Commonly known as:  WELLBUTRIN XL Take 1 tablet (300 mg total) by mouth daily.   gabapentin 300 MG capsule Commonly known as:  NEURONTIN Take 1 capsule (300 mg total) by mouth at bedtime.   insulin degludec 100 UNIT/ML Sopn FlexTouch Pen Commonly known as:  TRESIBA FLEXTOUCH Inject 0.45 mLs (45 Units total) into the skin daily at 10 pm.   INVOKAMET [phone] MG Tabs Generic drug:  Canagliflozin-Metformin HCl Take 1 tablet by mouth 2 (two) times daily.   levonorgestrel 20 MCG/24HR IUD Commonly known as:  MIRENA 1 each by Intrauterine route once.   MULTIVITAMIN ADULT PO Take by mouth.   ONE TOUCH ULTRA 2 w/Device Kit by Does not apply route.   Semaglutide 0.25 or  0.5 MG/DOSE Sopn Commonly known as:  OZEMPIC Inject 0.5 mg into the skin once a week.   vitamin C 1000 MG tablet Take 1,000 mg by mouth  daily.       Allergies: No Known Allergies  Past Medical History:  Diagnosis Date  . Back pain   . Diabetes mellitus     No past surgical history on file.  Family History  Problem Relation Age of Onset  . Diabetes Father   . Diabetes Mother   . Fibromyalgia Mother   . Heart disease Maternal Grandmother   . Gout Maternal Grandmother   . Heart attack Maternal Grandfather     Social History:  reports that she has quit smoking. She has never used smokeless tobacco. She reports that she does not drink alcohol or use drugs.    Review of Systems    She Is on Wellbutrin 300 mg SR both for improving her appetite and mood         Lipids: LDL below 100,       Lab Results  Component Value Date   CHOL 164 12/25/2016   HDL 38.30 (L) 12/25/2016   LDLCALC 89 01/31/2016   LDLDIRECT 99.0 12/25/2016   TRIG 203.0 (H) 12/25/2016   CHOLHDL 4 12/25/2016                   She has  history of Numbness, tingling And rarely burning in feet.    She Takes gabapentin as needed for restless legs at night    Physical Examination:  BP 124/78   Pulse (!) 101   Ht 5' 10" (1.778 m)   Wt 240 lb 12.8 oz (109.2 kg)   SpO2 98%   BMI 34.55 kg/m      ASSESSMENT/PLAN:  Diabetes type 2, uncontrolled with obesity and BMI 36 See history of present illness for current management, blood sugar patterns and problems identified  She is on a regimen of basal insulin, Ozempic and Invokamet twice a day  Her blood sugars are much better control overall with A1c down to 7% However she has gone off her diet and has poor control again since about 2 weeks ago and still has not gone back on her meal plan of cutting back on portions and carbohydrates as well as desserts  Has not been motivated to find time for exercise  She can also do better with checking her  sugars more consistently especially after meals  Recommendations:  Continue Ozempic 0.5 mg weekly  More consistent monitoring  She will try to find a place to exercise during her lunch hour Continue Wellbutrin  There are no Patient Instructions on file for this visit.       KUMAR,AJAY 03/16/2017, 11:27 AM   Note: This office note was prepared with Dragon voice recognition system technology. Any transcriptional errors that result from this process are unintentional.

## 2017-03-16 ENCOUNTER — Encounter: Payer: Self-pay | Admitting: Endocrinology

## 2017-03-16 ENCOUNTER — Other Ambulatory Visit: Payer: Self-pay | Admitting: Endocrinology

## 2017-03-16 ENCOUNTER — Ambulatory Visit (INDEPENDENT_AMBULATORY_CARE_PROVIDER_SITE_OTHER): Payer: BLUE CROSS/BLUE SHIELD | Admitting: Endocrinology

## 2017-03-16 VITALS — BP 124/78 | HR 101 | Ht 70.0 in | Wt 240.8 lb

## 2017-03-16 DIAGNOSIS — E1165 Type 2 diabetes mellitus with hyperglycemia: Secondary | ICD-10-CM | POA: Diagnosis not present

## 2017-03-16 DIAGNOSIS — Z794 Long term (current) use of insulin: Secondary | ICD-10-CM | POA: Diagnosis not present

## 2017-03-16 MED ORDER — INSULIN DEGLUDEC 100 UNIT/ML ~~LOC~~ SOPN: 45.0000 [IU] | PEN_INJECTOR | Freq: Every day | SUBCUTANEOUS | 2 refills | Status: DC

## 2017-03-16 MED ORDER — BUPROPION HCL ER (XL) 150 MG PO TB24: 150.0000 mg | ORAL_TABLET | Freq: Two times a day (BID) | ORAL | 5 refills | Status: DC

## 2017-03-16 NOTE — Patient Instructions (Addendum)
Walk at lunch  Better diet

## 2017-05-31 ENCOUNTER — Other Ambulatory Visit: Payer: Self-pay

## 2017-05-31 MED ORDER — LIRAGLUTIDE 18 MG/3ML ~~LOC~~ SOPN: PEN_INJECTOR | SUBCUTANEOUS | 3 refills | Status: DC

## 2017-06-08 ENCOUNTER — Other Ambulatory Visit (INDEPENDENT_AMBULATORY_CARE_PROVIDER_SITE_OTHER): Payer: BLUE CROSS/BLUE SHIELD

## 2017-06-08 ENCOUNTER — Other Ambulatory Visit: Payer: Self-pay | Admitting: Endocrinology

## 2017-06-08 DIAGNOSIS — E669 Obesity, unspecified: Secondary | ICD-10-CM

## 2017-06-08 DIAGNOSIS — E1169 Type 2 diabetes mellitus with other specified complication: Secondary | ICD-10-CM | POA: Diagnosis not present

## 2017-06-08 LAB — COMPREHENSIVE METABOLIC PANEL
ALT: 18 U/L (ref 0–35)
AST: 16 U/L (ref 0–37)
Albumin: 4.3 g/dL (ref 3.5–5.2)
Alkaline Phosphatase: 66 U/L (ref 39–117)
BUN: 14 mg/dL (ref 6–23)
CHLORIDE: 98 meq/L (ref 96–112)
CO2: 28 meq/L (ref 19–32)
CREATININE: 0.71 mg/dL (ref 0.40–1.20)
Calcium: 9.9 mg/dL (ref 8.4–10.5)
GFR: 92.04 mL/min (ref >60.00)
Glucose, Bld: 158 mg/dL — ABNORMAL HIGH (ref 70–99)
Potassium: 4.4 mEq/L (ref 3.5–5.1)
SODIUM: 136 meq/L (ref 135–145)
Total Bilirubin: 0.5 mg/dL (ref 0.2–1.2)
Total Protein: 8 g/dL (ref 6.0–8.3)

## 2017-06-08 LAB — HEMOGLOBIN A1C: Hgb A1c MFr Bld: 7.3 % — ABNORMAL HIGH (ref 4.6–6.5)

## 2017-06-15 ENCOUNTER — Encounter: Payer: Self-pay | Admitting: Endocrinology

## 2017-06-15 ENCOUNTER — Ambulatory Visit (INDEPENDENT_AMBULATORY_CARE_PROVIDER_SITE_OTHER): Payer: BLUE CROSS/BLUE SHIELD | Admitting: Endocrinology

## 2017-06-15 VITALS — BP 134/88 | HR 94 | Ht 70.0 in | Wt 241.8 lb

## 2017-06-15 DIAGNOSIS — E1165 Type 2 diabetes mellitus with hyperglycemia: Secondary | ICD-10-CM

## 2017-06-15 DIAGNOSIS — E6609 Other obesity due to excess calories: Secondary | ICD-10-CM | POA: Diagnosis not present

## 2017-06-15 DIAGNOSIS — Z23 Encounter for immunization: Secondary | ICD-10-CM | POA: Diagnosis not present

## 2017-06-15 DIAGNOSIS — Z794 Long term (current) use of insulin: Secondary | ICD-10-CM

## 2017-06-15 MED ORDER — SEMAGLUTIDE (1 MG/DOSE) 2 MG/1.5ML ~~LOC~~ SOPN: 1.0000 mg | PEN_INJECTOR | SUBCUTANEOUS | 3 refills | Status: DC

## 2017-06-15 NOTE — Progress Notes (Signed)
Patient ID: Pamela Shannon, female   DOB: [birth year], 51 y.o.   MRN: [id number]           Reason for Appointment: follow-up for Type 2 Diabetes  Referring physician: Mellody Drown  History of Present Illness:          Diagnosis: Type 2 diabetes mellitus, date of diagnosis: ?  2011       Past history:  At diagnosis she was having symptoms of marked fatigue, dry mouth but not clear how high her sugars were. She has been on various medications for treating her diabetes, probably metformin to start with. Also around the time of diagnosis she thinks she weighed about 300 pounds. She thinks she was able to lose weight subsequently and about 2 years ago had lost down to nearly 200 pounds All the details are not available of her previous management but she appears to have had progressively worsening requiring multiple agents and additional therapies. She thinks her A1c has been over 9% for about 2 years or more, lab records are not available She probably had been on metformin along with Tanzeum about a year or so ago. Because of inadequate control she was also given Invokamet at some point.  In 9/15 she was switched from Tanzeum to [place] and started on bedtime Levemir insulin, 20 units  Recent history:  INSULIN regimen: Tresiba 45 units in the morning  Non- hypoglycemic drugs the patient is taking are: Invokamet 150/1000 twice a day, Ozempic 0.5 mg weekly  Her A1c had come down to 7% but now it is 7.3  Current management, blood sugar patterns and problems identified:   She did not bring her monitor for download today  She says her sugars are probably higher because of stress  Despite reminders to exercise she says that she works 10-12 hours a day and does not find time to exercise  She still thinks that she needs an appetite suppressant and she cannot watch her diet consistently  Now she thinks her blood sugars are mostly higher in the evening about an hour after supper  Fasting readings  are usually mildly increased and lab glucose was 158  Also continues to take Invokamet maximum dose regularly without side effect     Side effects from medications have been: None Compliance with the medical regimen: Variable  Glucose monitoring:  done <1.   times a day         Glucometer: One Touch       Blood Glucose readings by recall:  Mean values apply above for all meters except median for One Touch  PRE-MEAL Fasting Lunch Dinner Bedtime Overall  Glucose range: 94-155   180-220   Mean/median:           Self-care: The diet that the patient has been following is: Variable    Meals: 3 meals per day, evening meal at 5 PM. Breakfast is protein shake, toast or oatmeal, sometimes cereal.   Has snacks with peanut butter crackers   Usually has unsweetened drinks  Exercise: none         Dietician visit, most recent: 11/19/14                Weight history: 200-260  Wt Readings from Last 3 Encounters:  06/15/17 241 lb 12.8 oz (109.7 kg)  03/16/17 240 lb 12.8 oz (109.2 kg)  02/09/17 237 lb (107.5 kg)      Lab Results  Component Value Date   HGBA1C 7.3 (H) 06/08/2017  HGBA1C 7.0 (H) 03/02/2017   HGBA1C 8.2 11/20/2016   Lab Results  Component Value Date   MICROALBUR 7.8 (H) 12/25/2016   LDLCALC 89 01/31/2016   CREATININE 0.71 06/08/2017    No visits with results within 1 Week(s) from this visit.  Latest known visit with results is:  Lab on 06/08/2017  Component Date Value Ref Range Status  . Hgb A1c MFr Bld 06/08/2017 7.3* 4.6 - 6.5 % Final   Glycemic Control Guidelines for People with Diabetes:Non Diabetic:  <6%Goal of Therapy: <7%Additional Action Suggested:  >8%   . Sodium 06/08/2017 136  135 - 145 mEq/L Final  . Potassium 06/08/2017 4.4  3.5 - 5.1 mEq/L Final  . Chloride 06/08/2017 98  96 - 112 mEq/L Final  . CO2 06/08/2017 28  19 - 32 mEq/L Final  . Glucose, Bld 06/08/2017 158* 70 - 99 mg/dL Final  . BUN 06/08/2017 14  6 - 23 mg/dL Final  . Creatinine, Ser  06/08/2017 0.71  0.40 - 1.20 mg/dL Final  . Total Bilirubin 06/08/2017 0.5  0.2 - 1.2 mg/dL Final  . Alkaline Phosphatase 06/08/2017 66  39 - 117 U/L Final  . AST 06/08/2017 16  0 - 37 U/L Final  . ALT 06/08/2017 18  0 - 35 U/L Final  . Total Protein 06/08/2017 8.0  6.0 - 8.3 g/dL Final  . Albumin 06/08/2017 4.3  3.5 - 5.2 g/dL Final  . Calcium 06/08/2017 9.9  8.4 - 10.5 mg/dL Final  . GFR 06/08/2017 92.04  >60.00 mL/min Final      Allergies as of 06/15/2017   No Known Allergies     Medication List       Accurate as of 06/15/17 10:33 AM. Always use your most recent med list.          ALEVE 220 MG Caps Generic drug:  Naproxen Sodium Take 1 capsule by mouth as needed.   buPROPion 150 MG 24 hr tablet Commonly known as:  WELLBUTRIN XL Take 1 tablet (150 mg total) by mouth 2 (two) times daily.   gabapentin 300 MG capsule Commonly known as:  NEURONTIN Take 1 capsule (300 mg total) by mouth at bedtime.   insulin degludec 100 UNIT/ML Sopn FlexTouch Pen Commonly known as:  TRESIBA FLEXTOUCH Inject 0.45 mLs (45 Units total) into the skin daily at 10 pm.   INVOKAMET [phone] MG Tabs Generic drug:  Canagliflozin-Metformin HCl Take 1 tablet by mouth 2 (two) times daily.   levonorgestrel 20 MCG/24HR IUD Commonly known as:  MIRENA 1 each by Intrauterine route once.   MULTIVITAMIN ADULT PO Take by mouth.   ONE TOUCH ULTRA 2 w/Device Kit by Does not apply route.   Semaglutide 1 MG/DOSE Sopn Commonly known as:  OZEMPIC Inject 1 mg into the skin once a week.   vitamin C 1000 MG tablet Take 1,000 mg by mouth daily.       Allergies: No Known Allergies  Past Medical History:  Diagnosis Date  . Back pain   . Diabetes mellitus     No past surgical history on file.  Family History  Problem Relation Age of Onset  . Diabetes Father   . Diabetes Mother   . Fibromyalgia Mother   . Heart disease Maternal Grandmother   . Gout Maternal Grandmother   . Heart attack  Maternal Grandfather     Social History:  reports that she has quit smoking. She has never used smokeless tobacco. She reports that she does not  drink alcohol or use drugs.    Review of Systems    She Is on Wellbutrin 300 mg SR both for improving her appetite and mood, She takes one at breakfast of 150 mg dose and 1 at lunchtime.  She thinks it helps her appetite suppression However asking for weight loss medication, these are apparently not covered         Lipids: LDL below 100, not on treatment      Lab Results  Component Value Date   CHOL 164 12/25/2016   HDL 38.30 (L) 12/25/2016   LDLCALC 89 01/31/2016   LDLDIRECT 99.0 12/25/2016   TRIG 203.0 (H) 12/25/2016   CHOLHDL 4 12/25/2016                   She has  history of Numbness, tingling And rarely burning in feet.    She Takes gabapentin as needed for restless legs at night    Physical Examination:  BP 134/88   Pulse 94   Ht _0  (1.778 m)   Wt 241 lb 12.8 oz (109.7 kg)   SpO2 98%   BMI 34.69 kg/m      ASSESSMENT/PLAN:  Diabetes type 2, uncontrolled with obesity and BMI 36 See history of present illness for current management, blood sugar patterns and problems identified  She is on a regimen of basal insulin, Ozempic and Invokamet twice a day  Her blood sugars are Slightly worse with A1c 7.3 compared to 7% on her last visit Weight is the same  She has difficulty with complying with her diet and higher postprandial readings Not able to review her home readings today and not clear how often she is having post prandial hyperglycemia Also she says she cannot find time to exercise with her work schedule   Recommendations:  Ozempic 1 mg weekly now  More readings after meals  Discussed need for possibly adding mealtime insulin especially if she is not able to get postprandial readings in the control  She will see the dietitian for meal planning  She can take both the Wellbutrin tablets together in the  morning  Discussed importance of exercise  Follow-up in 3 months  Influenza vaccine given  She will check with insurance about coverage for Belviq or Qsymia, may also consider Contrave  Endocrinology evaluation for federal exemption program completed  Patient Instructions  Check blood sugars on waking up  3-4/7  Also check blood sugars about 2 hours after a meal and do this after different meals by rotation  Recommended blood sugar levels on waking up is 90-130 and about 2 hours after meal is 130-160  Please bring your blood sugar monitor to each visit, thank you  New Rx for 56m Ozempic  Walk daily   Check insurance for Qsymia. Belviq      Counseling time on subjects discussed in assessment and plan sections is over 50% of today's 25 minute visit    Pamela Shannon 06/15/2017, 10:33 AM   Note: This office note was prepared with [place] agent Any transcriptional errors that result from this process are unintentional.

## 2017-06-15 NOTE — Patient Instructions (Addendum)
Check blood sugars on waking up  3-4/7  Also check blood sugars about 2 hours after a meal and do this after different meals by rotation  Recommended blood sugar levels on waking up is 90-130 and about 2 hours after meal is 130-160  Please bring your blood sugar monitor to each visit, thank you  New Rx for 1mg  Ozempic  Walk daily   Check insurance for Qsymia. Belviq

## 2017-07-03 ENCOUNTER — Other Ambulatory Visit: Payer: Self-pay | Admitting: Endocrinology

## 2017-07-04 ENCOUNTER — Other Ambulatory Visit: Payer: Self-pay

## 2017-07-04 ENCOUNTER — Telehealth: Payer: Self-pay | Admitting: Endocrinology

## 2017-07-04 NOTE — Telephone Encounter (Signed)
[facility] [address]

## 2017-07-04 NOTE — Telephone Encounter (Signed)
I see that Dr. Lucianne MussKumar has [facility] [address]. I have called the patient and left a voice message to let her know that this medication has already been sent to the pharmacy.

## 2017-08-16 ENCOUNTER — Telehealth: Payer: Self-pay | Admitting: Endocrinology

## 2017-08-16 NOTE — Telephone Encounter (Signed)
Pamela Shannon needs to be [facility] into [facility] [address] please

## 2017-08-17 ENCOUNTER — Other Ambulatory Visit: Payer: Self-pay

## 2017-08-17 MED ORDER — INSULIN DEGLUDEC 100 UNIT/ML ~~LOC~~ SOPN: 45.0000 [IU] | PEN_INJECTOR | Freq: Every day | SUBCUTANEOUS | 2 refills | Status: DC

## 2017-08-17 NOTE — Telephone Encounter (Signed)
This prescription has been sent.

## 2017-08-21 ENCOUNTER — Other Ambulatory Visit: Payer: Self-pay | Admitting: Endocrinology

## 2017-09-07 ENCOUNTER — Other Ambulatory Visit: Payer: BLUE CROSS/BLUE SHIELD

## 2017-09-14 ENCOUNTER — Ambulatory Visit: Payer: BLUE CROSS/BLUE SHIELD | Admitting: Endocrinology

## 2017-09-26 ENCOUNTER — Telehealth: Payer: Self-pay

## 2017-09-26 ENCOUNTER — Other Ambulatory Visit: Payer: Self-pay

## 2017-09-26 MED ORDER — INSULIN GLARGINE 300 UNIT/ML ~~LOC~~ SOPN: 50.0000 [IU] | PEN_INJECTOR | Freq: Every day | SUBCUTANEOUS | 2 refills | Status: DC

## 2017-09-26 NOTE — Telephone Encounter (Signed)
We will wait till she find out about the coverage.  Would prefer that she get Toujeo and Synjardy XR as substitutes

## 2017-09-26 NOTE — Telephone Encounter (Signed)
Patient called this morning and she stated that [place] and invokamet is no longer covered and I advised her to call her insurance company to find out what is covered and call us back so we can switch

## 2017-09-26 NOTE — Telephone Encounter (Signed)
She will take 50 units of Toujeo once a day and she needs to check on coverage for [place] and Farxiga

## 2017-09-26 NOTE — Telephone Encounter (Signed)
Have called in Toujeo and left a vm for the patient to call me back to discuss Jardiance and farxiga coverage

## 2017-09-26 NOTE — Telephone Encounter (Signed)
Patient spoke with insurance company and they no longer cover tresiba preferred are Lantus or Toujeo Invokamet is also no longer covered but the preferred is metformin- please advise if these can be switched and what the dosages will be so that I can switch them for the patient

## 2017-09-26 NOTE — Telephone Encounter (Signed)
Please advise 

## 2017-09-27 NOTE — Telephone Encounter (Signed)
Please do PA for [place]iba and [place]okamet

## 2017-09-27 NOTE — Telephone Encounter (Signed)
All need a PA per her insurance please advise

## 2017-09-27 NOTE — Telephone Encounter (Signed)
She needs to find  from insurance what alternatives are available

## 2017-09-27 NOTE — Telephone Encounter (Signed)
Patient called and stated toujeo jardiance and farxiga all need a PA please advise on how to proceed

## 2017-09-28 ENCOUNTER — Other Ambulatory Visit: Payer: Self-pay

## 2017-09-28 NOTE — Telephone Encounter (Signed)
This has been handled today

## 2017-09-28 NOTE — Telephone Encounter (Signed)
Medication was switched

## 2017-10-03 ENCOUNTER — Other Ambulatory Visit (INDEPENDENT_AMBULATORY_CARE_PROVIDER_SITE_OTHER): Payer: Commercial Managed Care - PPO

## 2017-10-03 DIAGNOSIS — E1165 Type 2 diabetes mellitus with hyperglycemia: Secondary | ICD-10-CM | POA: Diagnosis not present

## 2017-10-03 DIAGNOSIS — Z794 Long term (current) use of insulin: Secondary | ICD-10-CM

## 2017-10-03 LAB — COMPREHENSIVE METABOLIC PANEL
ALT: 24 U/L (ref 0–35)
AST: 21 U/L (ref 0–37)
Albumin: 4.2 g/dL (ref 3.5–5.2)
Alkaline Phosphatase: 69 U/L (ref 39–117)
BUN: 12 mg/dL (ref 6–23)
CO2: 27 mEq/L (ref 19–32)
CREATININE: 0.73 mg/dL (ref 0.40–1.20)
Calcium: 9.9 mg/dL (ref 8.4–10.5)
Chloride: 101 mEq/L (ref 96–112)
GFR: 89.03 mL/min (ref >60.00)
GLUCOSE: 187 mg/dL — AB (ref 70–99)
POTASSIUM: 4 meq/L (ref 3.5–5.1)
SODIUM: 137 meq/L (ref 135–145)
TOTAL PROTEIN: 7.7 g/dL (ref 6.0–8.3)
Total Bilirubin: 0.5 mg/dL (ref 0.2–1.2)

## 2017-10-03 LAB — HEMOGLOBIN A1C: Hgb A1c MFr Bld: 7.2 % — ABNORMAL HIGH (ref 4.6–6.5)

## 2017-10-10 ENCOUNTER — Encounter: Payer: Self-pay | Admitting: Endocrinology

## 2017-10-10 ENCOUNTER — Ambulatory Visit (INDEPENDENT_AMBULATORY_CARE_PROVIDER_SITE_OTHER): Payer: Commercial Managed Care - PPO | Admitting: Endocrinology

## 2017-10-10 VITALS — BP 122/86 | HR 108 | Ht 70.0 in | Wt 236.6 lb

## 2017-10-10 DIAGNOSIS — E6609 Other obesity due to excess calories: Secondary | ICD-10-CM

## 2017-10-10 DIAGNOSIS — Z794 Long term (current) use of insulin: Secondary | ICD-10-CM | POA: Diagnosis not present

## 2017-10-10 DIAGNOSIS — E1165 Type 2 diabetes mellitus with hyperglycemia: Secondary | ICD-10-CM | POA: Diagnosis not present

## 2017-10-10 MED ORDER — INSULIN GLARGINE 300 UNIT/ML ~~LOC~~ SOPN: 50.0000 [IU] | PEN_INJECTOR | Freq: Every day | SUBCUTANEOUS | 2 refills | Status: DC

## 2017-10-10 MED ORDER — DAPAGLIFLOZIN PRO-METFORMIN ER 5-1000 MG PO TB24: 2.0000 | ORAL_TABLET | Freq: Every day | ORAL | 2 refills | Status: DC

## 2017-10-10 NOTE — Progress Notes (Signed)
Patient ID: Pamela Shannon, female   DOB: Jan 05, 1966, 52 y.o.   MRN: [id number]           Reason for Appointment: follow-up for Type 2 Diabetes  Referring physician: Mellody Drown  History of Present Illness:          Diagnosis: Type 2 diabetes mellitus, date of diagnosis: ?  2011       Past history:  At diagnosis she was having symptoms of marked fatigue, dry mouth but not clear how high her sugars were. She has been on various medications for treating her diabetes, probably metformin to start with. Also around the time of diagnosis she thinks she weighed about 300 pounds. She thinks she was able to lose weight subsequently and about 2 years ago had lost down to nearly 200 pounds All the details are not available of her previous management but she appears to have had progressively worsening requiring multiple agents and additional therapies. She thinks her A1c has been over 9% for about 2 years or more, lab records are not available She probably had been on metformin along with Tanzeum about a year or so ago. Because of inadequate control she was also given Invokamet at some point.  In 9/15 she was switched from Tanzeum to [employer]me and started on bedtime Levemir insulin, 20 units  Recent history:  INSULIN regimen: Tresiba 45 units in the morning  Non- hypoglycemic drugs the patient is taking are: Invokamet 150/1000 twice a day, Ozempic 1 mg weekly  Her A1c had come down to 7% in 7/18, recently 7.2-7.3 including February  Current management, blood sugar patterns and problems identified:   She again did not bring her monitor for download today  Not clear why she continues to have higher postprandial readings at night by history  This is despite going up to 1 mg on her Ozempic weekly which she thinks she is taking regularly  Her diet can be variable although she thinks that he is not eating excessive portions, sometimes will have somewhat higher fat  Again she has had more stress and she  didn't this is causing high readings also  She is trying to do some walking when she is outdoors working or shopping  She has been able to lose some weight  Her main issue now is that she is not able to afford her brand name medications possibly because of a high deductible  Also her insurance wants her to take Toujeo or Lantus instead of [place] and she has not taken insulin for the last 2 or 3 days  Also going to run out of Invokamet which apparently is going to be $300 for her  Although she thinks her blood sugars are only slightly higher in the morning she has a reading of 187 fasting on the lab      Side effects from medications have been: None Compliance with the medical regimen: Variable  Glucose monitoring:  done <1.   times a day         Glucometer: One Touch       Blood Glucose readings by recall:  Mean values apply above for all meters except median for One Touch  PRE-MEAL Fasting Lunch Dinner Bedtime Overall  Glucose range: 130-150   210-220    Mean/median:            Self-care: The diet that the patient has been following is: Variable    Meals: 3 meals per day, evening meal at 5 PM. Breakfast is  protein shake, toast or oatmeal, sometimes cereal.    Has snacks with peanut butter crackers   Usually has unsweetened drinks, some juice  Exercise:  walks some in parking lots         Dietician visit, most recent: 11/19/14                Weight history: 200-260  Wt Readings from Last 3 Encounters:  10/10/17 236 lb 9.6 oz (107.3 kg)  06/15/17 241 lb 12.8 oz (109.7 kg)  03/16/17 240 lb 12.8 oz (109.2 kg)      Lab Results  Component Value Date   HGBA1C 7.2 (H) 10/03/2017   HGBA1C 7.3 (H) 06/08/2017   HGBA1C 7.0 (H) 03/02/2017   Lab Results  Component Value Date   MICROALBUR 7.8 (H) 12/25/2016   LDLCALC 89 01/31/2016   CREATININE 0.73 10/03/2017    No visits with results within 1 Week(s) from this visit.  Latest known visit with results is:  Lab on  10/03/2017  Component Date Value Ref Range Status  . Sodium 10/03/2017 137  135 - 145 mEq/L Final  . Potassium 10/03/2017 4.0  3.5 - 5.1 mEq/L Final  . Chloride 10/03/2017 101  96 - 112 mEq/L Final  . CO2 10/03/2017 27  19 - 32 mEq/L Final  . Glucose, Bld 10/03/2017 187* 70 - 99 mg/dL Final  . BUN 10/03/2017 12  6 - 23 mg/dL Final  . Creatinine, Ser 10/03/2017 0.73  0.40 - 1.20 mg/dL Final  . Total Bilirubin 10/03/2017 0.5  0.2 - 1.2 mg/dL Final  . Alkaline Phosphatase 10/03/2017 69  39 - 117 U/L Final  . AST 10/03/2017 21  0 - 37 U/L Final  . ALT 10/03/2017 24  0 - 35 U/L Final  . Total Protein 10/03/2017 7.7  6.0 - 8.3 g/dL Final  . Albumin 10/03/2017 4.2  3.5 - 5.2 g/dL Final  . Calcium 10/03/2017 9.9  8.4 - 10.5 mg/dL Final  . GFR 10/03/2017 89.03  >60.00 mL/min Final  . Hgb A1c MFr Bld 10/03/2017 7.2* 4.6 - 6.5 % Final   Glycemic Control Guidelines for People with Diabetes:Non Diabetic:  <6%Goal of Therapy: <7%Additional Action Suggested:  >8%       Allergies as of 10/10/2017   No Known Allergies     Medication List        Accurate as of 10/10/17  5:09 PM. Always use your most recent med list.          ALEVE 220 MG Caps Generic drug:  Naproxen Sodium Take 1 capsule by mouth as needed.   buPROPion 150 MG 24 hr tablet Commonly known as:  WELLBUTRIN XL Take 1 tablet (150 mg total) by mouth 2 (two) times daily.   Dapagliflozin-Metformin HCl ER 12-998 MG Tb24 Commonly known as:  XIGDUO XR Take 2 tablets by mouth daily with breakfast.   gabapentin 300 MG capsule Commonly known as:  NEURONTIN Take 1 capsule (300 mg total) by mouth at bedtime.   Insulin Glargine 300 UNIT/ML Sopn Commonly known as:  TOUJEO MAX SOLOSTAR Inject 50 Units into the skin daily.   INVOKAMET [phone] MG Tabs Generic drug:  Canagliflozin-Metformin HCl TAKE 1 TABLET BY MOUTH TWICE DAILY   levonorgestrel 20 MCG/24HR IUD Commonly known as:  MIRENA 1 each by Intrauterine route once.     MULTIVITAMIN ADULT PO Take by mouth.   ONE TOUCH ULTRA 2 w/Device Kit by Does not apply route.   Semaglutide 1 MG/DOSE Sopn Commonly  known as:  OZEMPIC Inject 1 mg into the skin once a week.   vitamin C 1000 MG tablet Take 1,000 mg by mouth daily.       Allergies: No Known Allergies  Past Medical History:  Diagnosis Date  . Back pain   . Diabetes mellitus     No past surgical history on file.  Family History  Problem Relation Age of Onset  . Diabetes Father   . Diabetes Mother   . Fibromyalgia Mother   . Heart disease Maternal Grandmother   . Gout Maternal Grandmother   . Heart attack Maternal Grandfather     Social History:  reports that she has quit smoking. she has never used smokeless tobacco. She reports that she does not drink alcohol or use drugs.    Review of Systems    She Is on Wellbutrin 300 mg SR both for improving her appetite and mood,    She thinks it helps her appetite suppression Also does better with taking both tablets of the 150 in the morning         Lipids: LDL below 100, not on treatment, needs follow-up      Lab Results  Component Value Date   CHOL 164 12/25/2016   HDL 38.30 (L) 12/25/2016   LDLCALC 89 01/31/2016   LDLDIRECT 99.0 12/25/2016   TRIG 203.0 (H) 12/25/2016   CHOLHDL 4 12/25/2016                   She has  recently less Numbness and burning in feet.    She Takes gabapentin as needed for restless legs at night    Physical Examination:  BP 122/86 (BP Location: Left Arm, Patient Position: Sitting, Cuff Size: Normal)   Pulse (!) 108   Ht '5\' 10"'  (1.778 m)   Wt 236 lb 9.6 oz (107.3 kg)   SpO2 97%   BMI 33.95 kg/m   Repeat blood pressure unchanged   Diabetic Foot Exam - Simple   Simple Foot Form Diabetic Foot exam was performed with the following findings:  Yes 10/10/2017  3:50 PM  Visual Inspection No deformities, no ulcerations, no other skin breakdown bilaterally:  Yes Sensation Testing Intact to touch  and monofilament testing bilaterally:  Yes Pulse Check Posterior Tibialis and Dorsalis pulse intact bilaterally:  Yes Comments      ASSESSMENT/PLAN:  Diabetes type 2, insulin requiring with obesity  See history of present illness for detailed discussion of current diabetes management, blood sugar patterns and problems identified  She is on a regimen of basal insulin, Ozempic 1 mg weekly  and Invokamet twice a day  Her blood sugars are  not adequately controlled but tried to be higher readings fasting at times and mostly high readings after supper when she checks them With her not bringing her blood sugar monitor not clear how often she is having high readings or her exact patterns She is however trying to lose a little weight with being a little more active and generally watching portions May not be always making the right choices  Currently she is having difficulty getting her insulin and Invokamet at an affordable price through her plan   Recommendations:   discussed differences between [place] and likely need for increasing the dose by at least 5 units  She will start with 50 units once a day  She will increase the dose further if she has higher fasting reading consistently  Continue Ozempic 1 mg weekly  now  Also will consider adding a mealtime insulin in the evening if her A1c is higher and evening blood sugars are consistently high also   Follow-up in 3 months   since she may have less out-of-pocket expense with Xigduo she will try this instead of Invokamet and try to get it covered through the co-pay card, explained to her that this is similar and hopefully will be as effective  Also given co-pay card for Toujeo max and prescription sent  She needs to also monitor some readings after breakfast or lunch to help her with meal planning   Endocrinology evaluation for federal exemption program  to be completed  OBESITY: She is benefiting somewhat from  Wellbutrin and she will continue   High normal blood pressure: Will need to continue monitoring, also she has not taken Invokamet for at least a day  Patient Instructions  Toujeo 50 U     Counseling time on subjects discussed in assessment and plan sections is over 50% of today's 25 minute visit    Elayne Snare 10/10/2017, 5:09 PM   Note: This office note was prepared with Dragon voice recognition system technology. Any transcriptional errors that result from this process are unintentional.

## 2017-10-10 NOTE — Patient Instructions (Signed)
Toujeo 50 U

## 2017-10-11 ENCOUNTER — Telehealth: Payer: Self-pay | Admitting: Endocrinology

## 2017-10-11 NOTE — Telephone Encounter (Signed)
Pamela Shannon is not on current medication list, please advise on if she should be on this medication?

## 2017-10-11 NOTE — Telephone Encounter (Signed)
Pt is aware and that medication was sent she just thought she was getting plain farixga

## 2017-10-11 NOTE — Telephone Encounter (Signed)
Davonna BellingXigduo was sent yesterday which has [place] and metformin.   Please check why his pharmacy does not have this

## 2017-10-11 NOTE — Telephone Encounter (Signed)
Patient stated pharmacy [facility]'t [facility]   [id number] - [place] ([place]), [place] - [address]

## 2017-10-12 ENCOUNTER — Telehealth: Payer: Self-pay

## 2017-10-12 NOTE — Telephone Encounter (Signed)
PA initiated for Xigduo, waiting on response from insurance.

## 2017-10-12 NOTE — Telephone Encounter (Signed)
I contacted the patient and advised via voicemail to try and use the Xigudo copayment card to try and pick the medication up at her pharmacy. Patient advised to call back if she has any further questions.

## 2017-10-15 DIAGNOSIS — Z0279 Encounter for issue of other medical certificate: Secondary | ICD-10-CM

## 2017-10-17 ENCOUNTER — Telehealth: Payer: Self-pay | Admitting: Endocrinology

## 2017-10-17 NOTE — Telephone Encounter (Signed)
°   Dapagliflozin-Metformin HCl ER (XIGDUO XR) 12-998 MG TB24      Pt stated that insurance does not cover this medication, she received a coupon card from our office and stated they would not take that since insurance does not cover medication. She stated dr said if this does not work that he could switch the medication    Please advise

## 2017-10-17 NOTE — Telephone Encounter (Signed)
LVM with instructions from the note below and requested a call back when she finds out what is covered

## 2017-10-17 NOTE — Telephone Encounter (Signed)
Either the pharmacy can try to use the co-pay card instead of insurance or she needs to find out from her insurance about Invokamet or Kirk RuthsSynjardy being covered

## 2017-10-20 ENCOUNTER — Other Ambulatory Visit: Payer: Self-pay | Admitting: Endocrinology

## 2017-10-24 ENCOUNTER — Other Ambulatory Visit: Payer: Self-pay | Admitting: Endocrinology

## 2017-10-24 ENCOUNTER — Telehealth: Payer: Self-pay | Admitting: Endocrinology

## 2017-10-24 MED ORDER — METFORMIN HCL ER 500 MG PO TB24: 2000.0000 mg | ORAL_TABLET | Freq: Every day | ORAL | 3 refills | Status: DC

## 2017-10-24 NOTE — Telephone Encounter (Signed)
Spoke with patient and gave her advice from below- patient stated she has talked to the insurance and the only pill they will cover is metformin please advise

## 2017-10-24 NOTE — Telephone Encounter (Signed)
He will up to check with her insurance if [place] Pepper[place]ardiance will be covered

## 2017-10-24 NOTE — Telephone Encounter (Signed)
Patient stated that insurance will not cover medication George InaXigdgo nor will the coupon card go through. Please advise

## 2017-10-24 NOTE — Telephone Encounter (Signed)
Have sent prescription for Metformin ER.  She will need to let us know if her blood sugars start going up

## 2017-10-25 ENCOUNTER — Telehealth: Payer: Self-pay

## 2017-10-25 NOTE — Telephone Encounter (Signed)
Spoke with the patient gave her the advice from the note below and she stated an understanding and will let us know if her bs starts running high

## 2017-10-25 NOTE — Telephone Encounter (Signed)
She should apply for both Ozempic and Jardiance

## 2017-10-25 NOTE — Telephone Encounter (Signed)
Patient requesting patient assistance for ozempic and for jardiance because she can not afford the medication- which ones should she apply for please advise

## 2017-10-26 NOTE — Telephone Encounter (Signed)
Which patient assistant programs help with these two meds?

## 2017-10-26 NOTE — Telephone Encounter (Signed)
Novo Nordisk and Boehringer IngleHeim

## 2017-10-29 ENCOUNTER — Other Ambulatory Visit: Payer: Self-pay

## 2017-10-29 NOTE — Telephone Encounter (Signed)
Filled out forms for pt assistance and informed patient via vm letting her know she can pick up the forms at the front desk whenever she gets a chance

## 2018-01-02 ENCOUNTER — Other Ambulatory Visit: Payer: Self-pay

## 2018-01-02 ENCOUNTER — Other Ambulatory Visit (INDEPENDENT_AMBULATORY_CARE_PROVIDER_SITE_OTHER): Payer: Commercial Managed Care - PPO

## 2018-01-02 ENCOUNTER — Telehealth: Payer: Self-pay | Admitting: Endocrinology

## 2018-01-02 DIAGNOSIS — E1165 Type 2 diabetes mellitus with hyperglycemia: Secondary | ICD-10-CM | POA: Diagnosis not present

## 2018-01-02 DIAGNOSIS — Z794 Long term (current) use of insulin: Secondary | ICD-10-CM | POA: Diagnosis not present

## 2018-01-02 LAB — COMPREHENSIVE METABOLIC PANEL
ALBUMIN: 3.9 g/dL (ref 3.5–5.2)
ALK PHOS: 55 U/L (ref 39–117)
ALT: 25 U/L (ref 0–35)
AST: 23 U/L (ref 0–37)
BILIRUBIN TOTAL: 0.5 mg/dL (ref 0.2–1.2)
BUN: 9 mg/dL (ref 6–23)
CALCIUM: 9.4 mg/dL (ref 8.4–10.5)
CO2: 30 mEq/L (ref 19–32)
Chloride: 102 mEq/L (ref 96–112)
Creatinine, Ser: 0.58 mg/dL (ref 0.40–1.20)
GFR: 115.98 mL/min (ref >60.00)
Glucose, Bld: 187 mg/dL — ABNORMAL HIGH (ref 70–99)
Potassium: 4.4 mEq/L (ref 3.5–5.1)
SODIUM: 139 meq/L (ref 135–145)
TOTAL PROTEIN: 7.4 g/dL (ref 6.0–8.3)

## 2018-01-02 LAB — MICROALBUMIN / CREATININE URINE RATIO
Creatinine,U: 73.1 mg/dL
MICROALB/CREAT RATIO: 18.7 mg/g (ref 0.0–30.0)
Microalb, Ur: 13.7 mg/dL — ABNORMAL HIGH (ref 0.0–1.9)

## 2018-01-02 LAB — LIPID PANEL
CHOLESTEROL: 179 mg/dL (ref 0–200)
HDL: 42.9 mg/dL (ref >39.00)
NonHDL: 135.75
Total CHOL/HDL Ratio: 4
Triglycerides: 249 mg/dL — ABNORMAL HIGH (ref 0.0–149.0)
VLDL: 49.8 mg/dL — ABNORMAL HIGH (ref 0.0–40.0)

## 2018-01-02 LAB — LDL CHOLESTEROL, DIRECT: Direct LDL: 112 mg/dL

## 2018-01-02 LAB — HEMOGLOBIN A1C: HEMOGLOBIN A1C: 7.5 % — AB (ref 4.6–6.5)

## 2018-01-02 MED ORDER — INSULIN GLARGINE 300 UNIT/ML ~~LOC~~ SOPN: 50.0000 [IU] | PEN_INJECTOR | Freq: Every day | SUBCUTANEOUS | 2 refills | Status: DC

## 2018-01-02 NOTE — Telephone Encounter (Signed)
RX sent to pharmacy  

## 2018-01-02 NOTE — Telephone Encounter (Signed)
Patient need a refill for Toujeo Send to [id number] - [place] ([place]), [place] - [address]

## 2018-01-09 ENCOUNTER — Ambulatory Visit (INDEPENDENT_AMBULATORY_CARE_PROVIDER_SITE_OTHER): Payer: Commercial Managed Care - PPO | Admitting: Endocrinology

## 2018-01-09 ENCOUNTER — Encounter: Payer: Self-pay | Admitting: Endocrinology

## 2018-01-09 VITALS — BP 136/82 | HR 110 | Ht 70.0 in | Wt 256.2 lb

## 2018-01-09 DIAGNOSIS — E1165 Type 2 diabetes mellitus with hyperglycemia: Secondary | ICD-10-CM | POA: Diagnosis not present

## 2018-01-09 DIAGNOSIS — Z794 Long term (current) use of insulin: Secondary | ICD-10-CM | POA: Diagnosis not present

## 2018-01-09 MED ORDER — DULAGLUTIDE 1.5 MG/0.5ML ~~LOC~~ SOAJ: SUBCUTANEOUS | 1 refills | Status: DC

## 2018-01-09 MED ORDER — CANAGLIFLOZIN 300 MG PO TABS: 300.0000 mg | ORAL_TABLET | Freq: Every day | ORAL | 3 refills | Status: DC

## 2018-01-09 NOTE — Progress Notes (Signed)
Patient ID: Pamela Shannon, female   DOB: [birth year], 52 y.o.   MRN: [id number]           Reason for Appointment: follow-up for Type 2 Diabetes  Referring physician: Mellody Drown  History of Present Illness:          Diagnosis: Type 2 diabetes mellitus, date of diagnosis: ?  2011       Past history:  At diagnosis she was having symptoms of marked fatigue, dry mouth but not clear how high her sugars were. She has been on various medications for treating her diabetes, probably metformin to start with. Also around the time of diagnosis she thinks she weighed about 300 pounds. She thinks she was able to lose weight subsequently and about 2 years ago had lost down to nearly 200 pounds All the details are not available of her previous management but she appears to have had progressively worsening requiring multiple agents and additional therapies. She thinks her A1c has been over 9% for about 2 years or more, lab records are not available She probably had been on metformin along with Tanzeum about a year or so ago. Because of inadequate control she was also given Invokamet at some point.  In 9/15 she was switched from Tanzeum to Victoza and started on bedtime Levemir insulin, 20 units  Recent history:  INSULIN regimen: Toujeo 52 units in the pm  Non- hypoglycemic drugs the patient is taking are: Metformin only,   Her A1c had come down to 7% in 7/18, now gradually increasing and up to 7.5  Current management, blood sugar patterns and problems identified:   She again did not bring her monitor for download today  Because of high pocket expense he has switch from [place] to [place]  However even with increasing her dosage by about 7 units her fasting readings are higher than before  Although her A1c has not gone up significantly her home blood sugars are consistently high and she has gained a significant amount of weight with not being able to afford her Ozempic 1 mg weekly  and Invokamet twice a  day  She is also not the time for exercise because of her busy schedule and likely not planning her meals well because of working long hours an  She is not able to also watch her diet with not taking Ozempic  She probably still has some high readings after meals but will be checking the rarely and only sometimes at night even though she thinks she is eating a lighter meal in the evening      Side effects from medications have been: None Compliance with the medical regimen: Variable  Glucose monitoring:  done <1.   times a day         Glucometer: One Touch       Blood Glucose readings by recall:  Mean values apply above for all meters except median for One Touch  PRE-MEAL Fasting Lunch Dinner Bedtime Overall  Glucose range: 180   210   Mean/median:            Self-care: The diet that the patient has been following is: Variable    Meals: 3 meals per day, evening meal at 5 PM. Breakfast is protein shake, toast or oatmeal, sometimes cereal.    Has snacks with peanut butter crackers   Usually has unsweetened drinks, some juice  Exercise: Minimal        Dietician visit, most recent: 11/19/14  Weight history: 200-260  Wt Readings from Last 3 Encounters:  01/09/18 256 lb 3.2 oz (116.2 kg)  10/10/17 236 lb 9.6 oz (107.3 kg)  06/15/17 241 lb 12.8 oz (109.7 kg)      Lab Results  Component Value Date   HGBA1C 7.5 (H) 01/02/2018   HGBA1C 7.2 (H) 10/03/2017   HGBA1C 7.3 (H) 06/08/2017   Lab Results  Component Value Date   MICROALBUR 13.7 (H) 01/02/2018   LDLCALC 89 01/31/2016   CREATININE 0.58 01/02/2018    No visits with results within 1 Week(s) from this visit.  Latest known visit with results is:  Lab on 01/02/2018  Component Date Value Ref Range Status  . Microalb, Ur 01/02/2018 13.7* 0.0 - 1.9 mg/dL Final  . Creatinine,U 01/02/2018 73.1  mg/dL Final  . Microalb Creat Ratio 01/02/2018 18.7  0.0 - 30.0 mg/g Final  . Cholesterol 01/02/2018 179  0 - 200  mg/dL Final   ATP III Classification       Desirable:  < 200 mg/dL               Borderline High:  200 - 239 mg/dL          High:  > = 240 mg/dL  . Triglycerides 01/02/2018 249.0* 0.0 - 149.0 mg/dL Final   Normal:  <150 mg/dLBorderline High:  150 - 199 mg/dL  . HDL 01/02/2018 42.90  >39.00 mg/dL Final  . VLDL 01/02/2018 49.8* 0.0 - 40.0 mg/dL Final  . Total CHOL/HDL Ratio 01/02/2018 4   Final                  Men          Women1/2 Average Risk     3.4          3.3Average Risk          5.0          4.42X Average Risk          9.6          7.13X Average Risk          15.0          11.0                      . NonHDL 01/02/2018 135.75   Final   NOTE:  Non-HDL goal should be 30 mg/dL higher than patient's LDL goal (i.e. LDL goal of < 70 mg/dL, would have non-HDL goal of < 100 mg/dL)  . Sodium 01/02/2018 139  135 - 145 mEq/L Final  . Potassium 01/02/2018 4.4  3.5 - 5.1 mEq/L Final  . Chloride 01/02/2018 102  96 - 112 mEq/L Final  . CO2 01/02/2018 30  19 - 32 mEq/L Final  . Glucose, Bld 01/02/2018 187* 70 - 99 mg/dL Final  . BUN 01/02/2018 9  6 - 23 mg/dL Final  . Creatinine, Ser 01/02/2018 0.58  0.40 - 1.20 mg/dL Final  . Total Bilirubin 01/02/2018 0.5  0.2 - 1.2 mg/dL Final  . Alkaline Phosphatase 01/02/2018 55  39 - 117 U/L Final  . AST 01/02/2018 23  0 - 37 U/L Final  . ALT 01/02/2018 25  0 - 35 U/L Final  . Total Protein 01/02/2018 7.4  6.0 - 8.3 g/dL Final  . Albumin 01/02/2018 3.9  3.5 - 5.2 g/dL Final  . Calcium 01/02/2018 9.4  8.4 - 10.5 mg/dL Final  . GFR 01/02/2018 115.98  >60.00 mL/min Final  .  Hgb A1c MFr Bld 01/02/2018 7.5* 4.6 - 6.5 % Final   Glycemic Control Guidelines for People with Diabetes:Non Diabetic:  <6%Goal of Therapy: <7%Additional Action Suggested:  >8%   . Direct LDL 01/02/2018 112.0  mg/dL Final   Optimal:  <100 mg/dLNear or Above Optimal:  100-129 mg/dLBorderline High:  130-159 mg/dLHigh:  160-189 mg/dLVery High:  >190 mg/dL      Allergies as of 01/09/2018   No  Known Allergies     Medication List        Accurate as of 01/09/18  1:13 PM. Always use your most recent med list.          buPROPion 150 MG 24 hr tablet Commonly known as:  WELLBUTRIN XL TAKE 1 TABLET BY MOUTH TWICE DAILY   canagliflozin 300 MG Tabs tablet Commonly known as:  INVOKANA Take 1 tablet (300 mg total) by mouth daily before breakfast.   Dulaglutide 1.5 MG/0.5ML Sopn Commonly known as:  TRULICITY Inject weekly   gabapentin 300 MG capsule Commonly known as:  NEURONTIN Take 1 capsule (300 mg total) by mouth at bedtime.   Insulin Glargine 300 UNIT/ML Sopn Commonly known as:  TOUJEO MAX SOLOSTAR Inject 50 Units into the skin daily.   levonorgestrel 20 MCG/24HR IUD Commonly known as:  MIRENA 1 each by Intrauterine route once.   metFORMIN 500 MG 24 hr tablet Commonly known as:  GLUCOPHAGE-XR Take 4 tablets (2,000 mg total) by mouth daily with supper.   MULTIVITAMIN ADULT PO Take by mouth.   ONE TOUCH ULTRA 2 w/Device Kit by Does not apply route.   vitamin C 1000 MG tablet Take 1,000 mg by mouth daily.       Allergies: No Known Allergies  Past Medical History:  Diagnosis Date  . Back pain   . Diabetes mellitus     History reviewed. No pertinent surgical history.  Family History  Problem Relation Age of Onset  . Diabetes Father   . Diabetes Mother   . Fibromyalgia Mother   . Heart disease Maternal Grandmother   . Gout Maternal Grandmother   . Heart attack Maternal Grandfather     Social History:  reports that she has quit smoking. She has never used smokeless tobacco. She reports that she does not drink alcohol or use drugs.    Review of Systems    She Is on Wellbutrin 300 mg SR both for improving her appetite and mood,    She thinks it helps her appetite suppression          Lipids: LDL below 100, not on treatment      Lab Results  Component Value Date   CHOL 179 01/02/2018   HDL 42.90 01/02/2018   LDLCALC 89 01/31/2016    LDLDIRECT 112.0 01/02/2018   TRIG 249.0 (H) 01/02/2018   CHOLHDL 4 01/02/2018                   She has  recently less Numbness and burning in feet.    She Takes gabapentin as needed for restless legs at night    Physical Examination:  BP 136/82 (BP Location: Left Arm, Patient Position: Sitting, Cuff Size: Normal)   Pulse (!) 110   Ht '5\' 10"'  (1.778 m)   Wt 256 lb 3.2 oz (116.2 kg)   SpO2 99%   BMI 36.76 kg/m    ASSESSMENT/PLAN:  Diabetes type 2, insulin requiring with obesity  See history of present illness for detailed discussion of current diabetes  management, blood sugar patterns and problems identified  She is on a regimen of basal insulin and metformin only now  Although her A1c has not gone up significantly her home blood sugars are consistently high and she has gained a significant amount of weight with not being able to afford her Ozempic 1 mg weekly  and Invokamet twice a day She is also not the time for exercise because of her busy schedule and likely not planning her meals well because of working long hours an She is not able to also watch her diet with not taking Ozempic  She will try to use Trulicity 1.5 mg weekly if she is able to get affordable coverage and given her brochure on this and discussed how the injection is done Co-pay card given Also co-pay card given for Invokana and she can try getting this filled now with the 300 mg dose Also co-pay card given for Toujeo May need to increase insulin if she is not able to get Trulicity or Invokana  Patient Instructions  Check blood sugars on waking up  daily  Also check blood sugars about 2 hours after a meal and do this after different meals by rotation  Recommended blood sugar levels on waking up is 90-130 and about 2 hours after meal is 130-160  Please bring your blood sugar monitor to each visit, thank you.  Walk daily            Elayne Snare 01/09/2018, 1:13 PM   Note: This office note was  prepared with Dragon voice recognition system technology. Any transcriptional errors that result from this process are unintentional.

## 2018-01-09 NOTE — Patient Instructions (Addendum)
Check blood sugars on waking up  daily  Also check blood sugars about 2 hours after a meal and do this after different meals by rotation  Recommended blood sugar levels on waking up is 90-130 and about 2 hours after meal is 130-160  Please bring your blood sugar monitor to each visit, thank you.  Walk daily

## 2018-01-17 ENCOUNTER — Ambulatory Visit: Payer: Commercial Managed Care - PPO | Admitting: Student

## 2018-01-30 ENCOUNTER — Telehealth: Payer: Self-pay

## 2018-01-30 NOTE — Telephone Encounter (Signed)
Please find out what insulin dose she is taking

## 2018-01-30 NOTE — Telephone Encounter (Signed)
Patient called today and stated every since she was put on Trulicity her blood sugar has not been below 200 here are some readings:  01/25/18- 222 01/27/18-198 01/28/18-230 01/29/18- 208 01/30/18- 317  All of these readings are taken randomly throughout the day, and patient would like to know if there is anything that can be done- please advise

## 2018-01-31 NOTE — Telephone Encounter (Signed)
Needs to increase her Toujeo dose by 6 units, need to document what she is taking

## 2018-01-31 NOTE — Telephone Encounter (Signed)
Pt was called back and medications verified. Pt is received Trulicity SQ 1.5mg  weekly, Toujeo 50units SQ QD, Invokana 300mg  PO QD, and Metformin 500mg , take 4 tabs PO QD. Pt was advised of MD message to increase Toujeo by 6 units QD. Pt verbalized understanding.

## 2018-01-31 NOTE — Telephone Encounter (Signed)
Patient is taking Toujeo and Trulicity as instructed

## 2018-02-13 ENCOUNTER — Other Ambulatory Visit: Payer: Self-pay | Admitting: Endocrinology

## 2018-03-04 ENCOUNTER — Other Ambulatory Visit: Payer: Self-pay | Admitting: Endocrinology

## 2018-04-04 ENCOUNTER — Other Ambulatory Visit (INDEPENDENT_AMBULATORY_CARE_PROVIDER_SITE_OTHER): Payer: Commercial Managed Care - PPO

## 2018-04-04 DIAGNOSIS — E1165 Type 2 diabetes mellitus with hyperglycemia: Secondary | ICD-10-CM | POA: Diagnosis not present

## 2018-04-04 DIAGNOSIS — Z794 Long term (current) use of insulin: Secondary | ICD-10-CM | POA: Diagnosis not present

## 2018-04-04 LAB — BASIC METABOLIC PANEL
BUN: 15 mg/dL (ref 6–23)
CALCIUM: 9.7 mg/dL (ref 8.4–10.5)
CO2: 29 meq/L (ref 19–32)
CREATININE: 0.76 mg/dL (ref 0.40–1.20)
Chloride: 97 mEq/L (ref 96–112)
GFR: 84.82 mL/min (ref >60.00)
Glucose, Bld: 195 mg/dL — ABNORMAL HIGH (ref 70–99)
Potassium: 3.8 mEq/L (ref 3.5–5.1)
Sodium: 134 mEq/L — ABNORMAL LOW (ref 135–145)

## 2018-04-04 LAB — HEMOGLOBIN A1C: HEMOGLOBIN A1C: 7.5 % — AB (ref 4.6–6.5)

## 2018-04-10 ENCOUNTER — Ambulatory Visit: Payer: Commercial Managed Care - PPO | Admitting: Endocrinology

## 2018-04-10 ENCOUNTER — Ambulatory Visit (INDEPENDENT_AMBULATORY_CARE_PROVIDER_SITE_OTHER): Payer: Commercial Managed Care - PPO | Admitting: Endocrinology

## 2018-04-10 ENCOUNTER — Encounter: Payer: Self-pay | Admitting: Endocrinology

## 2018-04-10 VITALS — BP 142/82 | HR 108 | Ht 69.0 in | Wt 247.0 lb

## 2018-04-10 DIAGNOSIS — E7800 Pure hypercholesterolemia, unspecified: Secondary | ICD-10-CM

## 2018-04-10 DIAGNOSIS — E1165 Type 2 diabetes mellitus with hyperglycemia: Secondary | ICD-10-CM | POA: Diagnosis not present

## 2018-04-10 DIAGNOSIS — Z794 Long term (current) use of insulin: Secondary | ICD-10-CM | POA: Diagnosis not present

## 2018-04-10 MED ORDER — INSULIN LISPRO 100 UNIT/ML (KWIKPEN): PEN_INJECTOR | SUBCUTANEOUS | 1 refills | Status: DC

## 2018-04-10 NOTE — Progress Notes (Signed)
Patient ID: Pamela Shannon, female   DOB: Jan 06, 1966, 52 y.o.   MRN: [id number]           Reason for Appointment: follow-up for Type 2 Diabetes  Referring physician: Mellody Drown  History of Present Illness:          Diagnosis: Type 2 diabetes mellitus, date of diagnosis: ?  2011       Past history:  At diagnosis she was having symptoms of marked fatigue, dry mouth but not clear how high her sugars were. She has been on various medications for treating her diabetes, probably metformin to start with. Also around the time of diagnosis she thinks she weighed about 300 pounds. She thinks she was able to lose weight subsequently and about 2 years ago had lost down to nearly 200 pounds All the details are not available of her previous management but she appears to have had progressively worsening requiring multiple agents and additional therapies. She thinks her A1c has been over 9% for about 2 years or more, lab records are not available She probably had been on metformin along with Tanzeum about a year or so ago. Because of inadequate control she was also given Invokamet at some point.  In 9/15 she was switched from Tanzeum to Victoza and started on bedtime Levemir insulin, 20 units  Recent history:  INSULIN regimen: Toujeo 32 bid  Non- hypoglycemic drugs the patient is taking are: Metformin, Invokana 062BJ, Trulicity 1.5 mg weekly  Her A1c had come down to 7% in 7/18, now again 7.5   Current management, blood sugar patterns and problems identified:   She did not bring her monitor for download today  She was able to start on Trulicity and Invokana on her last visit when she had gained about 20 pounds  With this her weight has come down 9 pounds but her sugar levels are not looking any better  She is reportedly having consistently high fasting blood sugars but the highest readings are after supper and as high as about 227  Lab glucose nonfasting was 195  She is doing a little walking at  times while at work but no programmed exercise  She did better with containing her diet and portions when she was taking Ozempic; now asking about taking her Wellbutrin 3 times a day to help her carbohydrate and portion control  No side effects from Invokana or Trulicity      Side effects from medications have been: None Compliance with the medical regimen: Variable  Glucose monitoring:  done ?  1.   times a day         Glucometer: One Touch       Blood Glucose readings by recall:  Mean values apply above for all meters except median for One Touch  PRE-MEAL Fasting Lunch Dinner Bedtime Overall  Glucose range: 160-200  160 200+   Mean/median:            Self-care: The diet that the patient has been following is: Variable    Meals: 3 meals per day, evening meal at 5 PM. Breakfast is protein shake, toast or oatmeal, sometimes cereal.    Has snacks with peanut butter crackers   Usually has unsweetened drinks, some juice  Exercise: A little walking at work         Dietician visit, most recent: 11/19/14                Weight history: Previous range 200-260  Wt Readings from  Last 3 Encounters:  04/10/18 247 lb (112 kg)  01/09/18 256 lb 3.2 oz (116.2 kg)  10/10/17 236 lb 9.6 oz (107.3 kg)      Lab Results  Component Value Date   HGBA1C 7.5 (H) 04/04/2018   HGBA1C 7.5 (H) 01/02/2018   HGBA1C 7.2 (H) 10/03/2017   Lab Results  Component Value Date   MICROALBUR 13.7 (H) 01/02/2018   LDLCALC 89 01/31/2016   CREATININE 0.76 04/04/2018    Lab on 04/04/2018  Component Date Value Ref Range Status  . Sodium 04/04/2018 134* 135 - 145 mEq/L Final  . Potassium 04/04/2018 3.8  3.5 - 5.1 mEq/L Final  . Chloride 04/04/2018 97  96 - 112 mEq/L Final  . CO2 04/04/2018 29  19 - 32 mEq/L Final  . Glucose, Bld 04/04/2018 195* 70 - 99 mg/dL Final  . BUN 04/04/2018 15  6 - 23 mg/dL Final  . Creatinine, Ser 04/04/2018 0.76  0.40 - 1.20 mg/dL Final  . Calcium 04/04/2018 9.7  8.4 - 10.5  mg/dL Final  . GFR 04/04/2018 84.82  >60.00 mL/min Final  . Hgb A1c MFr Bld 04/04/2018 7.5* 4.6 - 6.5 % Final   Glycemic Control Guidelines for People with Diabetes:Non Diabetic:  <6%Goal of Therapy: <7%Additional Action Suggested:  >8%       Allergies as of 04/10/2018   No Known Allergies     Medication List        Accurate as of 04/10/18 11:31 AM. Always use your most recent med list.          buPROPion 150 MG 24 hr tablet Commonly known as:  WELLBUTRIN XL TAKE 1 TABLET BY MOUTH TWICE DAILY   canagliflozin 300 MG Tabs tablet Commonly known as:  INVOKANA Take 1 tablet (300 mg total) by mouth daily before breakfast.   gabapentin 300 MG capsule Commonly known as:  NEURONTIN Take 1 capsule (300 mg total) by mouth at bedtime.   Insulin Glargine 300 UNIT/ML Sopn Inject 50 Units into the skin daily.   insulin lispro 100 UNIT/ML KiwkPen Commonly known as:  HUMALOG 10 units before meals   levonorgestrel 20 MCG/24HR IUD Commonly known as:  MIRENA 1 each by Intrauterine route once.   metFORMIN 500 MG 24 hr tablet Commonly known as:  GLUCOPHAGE-XR TAKE 4 TABLETS BY MOUTH ONCE DAILY WITH SUPPER   MULTIVITAMIN ADULT PO Take by mouth.   ONE TOUCH ULTRA 2 w/Device Kit by Does not apply route.   TRULICITY 1.5 TL/5.7WI Sopn Generic drug:  Dulaglutide INJECT SUBCUTANEOUSLY ONCE WEEKLY   vitamin C 1000 MG tablet Take 1,000 mg by mouth daily.       Allergies: No Known Allergies  Past Medical History:  Diagnosis Date  . Back pain   . Diabetes mellitus     History reviewed. No pertinent surgical history.  Family History  Problem Relation Age of Onset  . Diabetes Father   . Diabetes Mother   . Fibromyalgia Mother   . Heart disease Maternal Grandmother   . Gout Maternal Grandmother   . Heart attack Maternal Grandfather     Social History:  reports that she has quit smoking. She has never used smokeless tobacco. She reports that she does not drink alcohol or  use drugs.    Review of Systems    She on Wellbutrin 300 mg SR both for improving her appetite and mood,    She thinks it helps her appetite but only part of the day and she  is taking both her tablets of 150 mg in the morning currently         Lipids: LDL below 100, not on treatment      Lab Results  Component Value Date   CHOL 179 01/02/2018   HDL 42.90 01/02/2018   LDLCALC 89 01/31/2016   LDLDIRECT 112.0 01/02/2018   TRIG 249.0 (H) 01/02/2018   CHOLHDL 4 01/02/2018                   She has periodic numbness and burning in feet.    She also takes gabapentin as needed for restless legs at night    Physical Examination:  BP (!) 142/82 (BP Location: Left Arm, Patient Position: Sitting, Cuff Size: Normal)   Pulse (!) 108   Ht _0  (1.753 m)   Wt 247 lb (112 kg)   SpO2 98%   BMI 36.48 kg/m    ASSESSMENT/PLAN:  Diabetes type 2, insulin requiring with obesity  See history of present illness for detailed discussion of current diabetes management, blood sugar patterns and problems identified  Her A1c is 7.5  However at home her blood sugars are probably averaging about 180 with high readings both fasting and throughout the day and the highest after supper  She is on a regimen of basal insulin, Trulicity, Invokana and metformin  Discussed that she is not getting control of her fasting or postprandial readings with this regimen although is doing better with Ozempic Still having difficulties with insurance coverage for some products but is able to continue Trulicity and Toujeo for now She does not exercise Still not able to lose enough weight  Plan: Today discussed in detail the need for mealtime insulin to cover postprandial spikes, action of mealtime insulin, use of the insulin pen, timing and action of the rapid acting insulin as well as starting dose and dosage titration to target the two-hour reading of under 180 She can start with 6 to 8 units of the Humalog  before supper only and only skipped this if she is eating a salad with no carbohydrates She will continue to adjust this to keep her sugars from being over 180 after eating at least Co-pay card given for free box of Humalog   If she does continue have readings at least over 140 in the morning she will go up another 4 units on her Toujeo and this can be done once a day in the evening at suppertime with her Humalog  Meanwhile continue Invokana and Trulicity  Follow-up in 2 months and make sure she comes with her meter every time Does need to increase her walking for exercise   Patient Instructions  Check blood sugars on waking up  3/7 days at least  Also check blood sugars about 2 hours after a meal and do this after different meals by rotation  Recommended blood sugar levels on waking up is 90-130 and about 2 hours after meal is 130-160  Please bring your blood sugar monitor to each visit, thank you   INSULIN:  Switch back the Toujeo to suppertime and take 64 units. After 1 week if your morning sugars are still more than 140 go up to 68 units  HUMALOG is a mealtime insulin to cover postprandial spikes in the blood sugar and needs to be to be taken right before eating  Start with 6 units at suppertime daily If blood sugars after supper are staying over 180 go up to 8 or 10 units  May also adjust up or down 2 to 4 units if eating smaller or larger meals May skip if eating no carbohydrates at suppertime  Start walking regularly for exercise           Elayne Snare 04/10/2018, 11:31 AM   Note: This office note was prepared with Dragon voice rec ognition system technology. Any transcriptional errors that result from this process are unintentional.

## 2018-04-10 NOTE — Patient Instructions (Addendum)
Check blood sugars on waking up  3/7 days at least  Also check blood sugars about 2 hours after a meal and do this after different meals by rotation  Recommended blood sugar levels on waking up is 90-130 and about 2 hours after meal is 130-160  Please bring your blood sugar monitor to each visit, thank you   INSULIN:  Switch back the Toujeo to suppertime and take 64 units. After 1 week if your morning sugars are still more than 140 go up to 68 units  HUMALOG is a mealtime insulin to cover postprandial spikes in the blood sugar and needs to be to be taken right before eating  Start with 6 units at suppertime daily If blood sugars after supper are staying over 180 go up to 8 or 10 units May also adjust up or down 2 to 4 units if eating smaller or larger meals May skip if eating no carbohydrates at suppertime  Start walking regularly for exercise

## 2018-04-22 ENCOUNTER — Other Ambulatory Visit: Payer: Self-pay | Admitting: Endocrinology

## 2018-05-02 ENCOUNTER — Other Ambulatory Visit: Payer: Self-pay | Admitting: Endocrinology

## 2018-05-20 ENCOUNTER — Telehealth: Payer: Self-pay | Admitting: Endocrinology

## 2018-05-20 NOTE — Telephone Encounter (Signed)
Pt. Needs new RX refill sent to [facility] on [address] Insulin Glargine (TOUJEO MAX SOLOSTAR) 300 UNIT/ML SOPN Ph # [phone]

## 2018-05-21 MED ORDER — INSULIN GLARGINE 300 UNIT/ML ~~LOC~~ SOPN: 50.0000 [IU] | PEN_INJECTOR | Freq: Every day | SUBCUTANEOUS | 2 refills | Status: DC

## 2018-05-21 NOTE — Telephone Encounter (Signed)
Sent!

## 2018-05-21 NOTE — Addendum Note (Signed)
Addended by: Yolande Jolly on: 05/21/2018 09:04 AM   Modules accepted: Orders

## 2018-05-23 ENCOUNTER — Other Ambulatory Visit: Payer: Self-pay | Admitting: Endocrinology

## 2018-06-10 ENCOUNTER — Other Ambulatory Visit: Payer: Self-pay | Admitting: Endocrinology

## 2018-07-02 ENCOUNTER — Other Ambulatory Visit (INDEPENDENT_AMBULATORY_CARE_PROVIDER_SITE_OTHER): Payer: Commercial Managed Care - PPO

## 2018-07-02 DIAGNOSIS — Z794 Long term (current) use of insulin: Secondary | ICD-10-CM | POA: Diagnosis not present

## 2018-07-02 DIAGNOSIS — E7800 Pure hypercholesterolemia, unspecified: Secondary | ICD-10-CM

## 2018-07-02 DIAGNOSIS — E1165 Type 2 diabetes mellitus with hyperglycemia: Secondary | ICD-10-CM | POA: Diagnosis not present

## 2018-07-02 LAB — LIPID PANEL
CHOL/HDL RATIO: 5
CHOLESTEROL: 164 mg/dL (ref 0–200)
HDL: 34.1 mg/dL — ABNORMAL LOW (ref >39.00)
NonHDL: 129.61
Triglycerides: 308 mg/dL — ABNORMAL HIGH (ref 0.0–149.0)
VLDL: 61.6 mg/dL — ABNORMAL HIGH (ref 0.0–40.0)

## 2018-07-02 LAB — COMPREHENSIVE METABOLIC PANEL
ALT: 42 U/L — ABNORMAL HIGH (ref 0–35)
AST: 44 U/L — AB (ref 0–37)
Albumin: 4.3 g/dL (ref 3.5–5.2)
Alkaline Phosphatase: 63 U/L (ref 39–117)
BUN: 12 mg/dL (ref 6–23)
CHLORIDE: 99 meq/L (ref 96–112)
CO2: 27 meq/L (ref 19–32)
CREATININE: 0.59 mg/dL (ref 0.40–1.20)
Calcium: 10 mg/dL (ref 8.4–10.5)
GFR: 113.49 mL/min (ref >60.00)
Glucose, Bld: 152 mg/dL — ABNORMAL HIGH (ref 70–99)
Potassium: 4 mEq/L (ref 3.5–5.1)
SODIUM: 136 meq/L (ref 135–145)
Total Bilirubin: 0.5 mg/dL (ref 0.2–1.2)
Total Protein: 7.7 g/dL (ref 6.0–8.3)

## 2018-07-02 LAB — LDL CHOLESTEROL, DIRECT: LDL DIRECT: 99 mg/dL

## 2018-07-02 LAB — HEMOGLOBIN A1C: Hgb A1c MFr Bld: 7.8 % — ABNORMAL HIGH (ref 4.6–6.5)

## 2018-07-03 LAB — FRUCTOSAMINE: FRUCTOSAMINE: 259 umol/L (ref 0–285)

## 2018-07-08 ENCOUNTER — Other Ambulatory Visit: Payer: Commercial Managed Care - PPO

## 2018-07-12 ENCOUNTER — Ambulatory Visit (INDEPENDENT_AMBULATORY_CARE_PROVIDER_SITE_OTHER): Payer: Commercial Managed Care - PPO | Admitting: Endocrinology

## 2018-07-12 ENCOUNTER — Encounter: Payer: Self-pay | Admitting: Endocrinology

## 2018-07-12 VITALS — BP 138/70 | HR 96 | Ht 69.0 in | Wt 242.0 lb

## 2018-07-12 DIAGNOSIS — E1165 Type 2 diabetes mellitus with hyperglycemia: Secondary | ICD-10-CM | POA: Diagnosis not present

## 2018-07-12 DIAGNOSIS — E785 Hyperlipidemia, unspecified: Secondary | ICD-10-CM

## 2018-07-12 DIAGNOSIS — Z794 Long term (current) use of insulin: Secondary | ICD-10-CM | POA: Diagnosis not present

## 2018-07-12 NOTE — Progress Notes (Signed)
Patient ID: Pamela Shannon, female   DOB: [birth year], 52 y.o.   MRN: [id number]           Reason for Appointment: follow-up for Type 2 Diabetes  Referring physician: Mellody Drown  History of Present Illness:          Diagnosis: Type 2 diabetes mellitus, date of diagnosis: ?  2011       Past history:  At diagnosis she was having symptoms of marked fatigue, dry mouth but not clear how high her sugars were. She has been on various medications for treating her diabetes, probably metformin to start with. Also around the time of diagnosis she thinks she weighed about 300 pounds. She thinks she was able to lose weight subsequently and about 2 years ago had lost down to nearly 200 pounds All the details are not available of her previous management but she appears to have had progressively worsening requiring multiple agents and additional therapies. She thinks her A1c has been over 9% for about 2 years or more, lab records are not available She probably had been on metformin along with Tanzeum about a year or so ago. Because of inadequate control she was also given Invokamet at some point.  In 9/15 she was switched from Tanzeum to Victoza and started on bedtime Levemir insulin, 20 units  Recent history:  INSULIN regimen: Toujeo 50 units, Humalog 10 units before largest meals  Non- hypoglycemic drugs the patient is taking are: Metformin, Invokana 740CX, Trulicity 1.5 mg weekly  Her A1c had come down to 7% in 7/18, now slightly higher at 7.8  Current management, blood sugar patterns and problems identified:   She did bring her monitor for download today  She was able to start on mealtime insulin on her last visit when she was having significantly high postprandial readings especially at night  However she still has blood sugars as high as 291 after meals  She thinks she is not able to be consistent with watching her diet, making poor choices or not having portion control  This is despite taking  Trulicity consistently  FASTING readings are mostly high although have been as low as 94 and 113  She thinks this is dependent on her compliance with diet  Occasionally will have a higher reading after lunch from eating poorly at lunch also  Not doing any formal exercise because of long working hours  She has however lost weight      Side effects from medications have been: None Compliance with the medical regimen: Variable  Glucose monitoring:  done ?  1.   times a day         Glucometer: One Touch       Blood Glucose readings by monitor download:   PRE-MEAL Fasting Lunch Dinner Bedtime Overall  Glucose range:  94-220   111-214    Mean/median:  168     169+/-54   POST-MEAL PC Breakfast PC Lunch PC Dinner  Glucose range:   290  249, 291  Mean/median:          Self-care: The diet that the patient has been following is: Variable    Meals: 3 meals per day, evening meal at 5 PM. Breakfast is protein shake, toast or oatmeal, sometimes cereal.    Has snacks with peanut butter crackers   Usually has unsweetened drinks, some juice  Exercise: A little walking at work         Dietician visit, most recent: 11/19/14  Weight history: Previous range 200-260  Wt Readings from Last 3 Encounters:  07/12/18 242 lb (109.8 kg)  04/10/18 247 lb (112 kg)  01/09/18 256 lb 3.2 oz (116.2 kg)      Lab Results  Component Value Date   HGBA1C 7.8 (H) 07/02/2018   HGBA1C 7.5 (H) 04/04/2018   HGBA1C 7.5 (H) 01/02/2018   Lab Results  Component Value Date   MICROALBUR 13.7 (H) 01/02/2018   LDLCALC 89 01/31/2016   CREATININE 0.59 07/02/2018    No visits with results within 1 Week(s) from this visit.  Latest known visit with results is:  Lab on 07/02/2018  Component Date Value Ref Range Status  . Cholesterol 07/02/2018 164  0 - 200 mg/dL Final   ATP III Classification       Desirable:  < 200 mg/dL               Borderline High:  200 - 239 mg/dL          High:  > = 240  mg/dL  . Triglycerides 07/02/2018 308.0* 0.0 - 149.0 mg/dL Final   Normal:  <150 mg/dLBorderline High:  150 - 199 mg/dL  . HDL 07/02/2018 34.10* >39.00 mg/dL Final  . VLDL 07/02/2018 61.6* 0.0 - 40.0 mg/dL Final  . Total CHOL/HDL Ratio 07/02/2018 5   Final                  Men          Women1/2 Average Risk     3.4          3.3Average Risk          5.0          4.42X Average Risk          9.6          7.13X Average Risk          15.0          11.0                      . NonHDL 07/02/2018 129.61   Final   NOTE:  Non-HDL goal should be 30 mg/dL higher than patient's LDL goal (i.e. LDL goal of < 70 mg/dL, would have non-HDL goal of < 100 mg/dL)  . Fructosamine 07/02/2018 259  0 - 285 umol/L Final   Comment: Published reference interval for apparently healthy subjects between age 15 and 52 is 77 - 285 umol/L and in a poorly controlled diabetic population is 228 - 563 umol/L with a mean of 396 umol/L.   [name] Sodium 07/02/2018 136  135 - 145 mEq/L Final  . Potassium 07/02/2018 4.0  3.5 - 5.1 mEq/L Final  . Chloride 07/02/2018 99  96 - 112 mEq/L Final  . CO2 07/02/2018 27  19 - 32 mEq/L Final  . Glucose, Bld 07/02/2018 152* 70 - 99 mg/dL Final  . BUN 07/02/2018 12  6 - 23 mg/dL Final  . Creatinine, Ser 07/02/2018 0.59  0.40 - 1.20 mg/dL Final  . Total Bilirubin 07/02/2018 0.5  0.2 - 1.2 mg/dL Final  . Alkaline Phosphatase 07/02/2018 63  39 - 117 U/L Final  . AST 07/02/2018 44* 0 - 37 U/L Final  . ALT 07/02/2018 42* 0 - 35 U/L Final  . Total Protein 07/02/2018 7.7  6.0 - 8.3 g/dL Final  . Albumin 07/02/2018 4.3  3.5 - 5.2 g/dL Final  . Calcium 07/02/2018 10.0  8.4 -  10.5 mg/dL Final  . GFR 07/02/2018 113.49  >60.00 mL/min Final  . Hgb A1c MFr Bld 07/02/2018 7.8* 4.6 - 6.5 % Final   Glycemic Control Guidelines for People with Diabetes:Non Diabetic:  <6%Goal of Therapy: <7%Additional Action Suggested:  >8%   . Direct LDL 07/02/2018 99.0  mg/dL Final   Optimal:  <100 mg/dLNear or Above Optimal:   100-129 mg/dLBorderline High:  130-159 mg/dLHigh:  160-189 mg/dLVery High:  >190 mg/dL      Allergies as of 07/12/2018   No Known Allergies     Medication List        Accurate as of 07/12/18  1:39 PM. Always use your most recent med list.          buPROPion 150 MG 24 hr tablet Commonly known as:  WELLBUTRIN XL TAKE 1 TABLET BY MOUTH TWICE DAILY   gabapentin 300 MG capsule Commonly known as:  NEURONTIN Take 1 capsule (300 mg total) by mouth at bedtime.   Insulin Glargine 300 UNIT/ML Sopn Inject 50 Units into the skin daily.   insulin lispro 100 UNIT/ML KiwkPen Commonly known as:  HUMALOG 10 units before meals   INVOKANA 300 MG Tabs tablet Generic drug:  canagliflozin TAKE 1 TABLET BY MOUTH ONCE DAILY BEFORE BREAKFAST   levonorgestrel 20 MCG/24HR IUD Commonly known as:  MIRENA 1 each by Intrauterine route once.   metFORMIN 500 MG 24 hr tablet Commonly known as:  GLUCOPHAGE-XR TAKE 4 TABLETS BY MOUTH ONCE DAILY WITH SUPPER   MULTIVITAMIN ADULT PO Take by mouth.   ONE TOUCH ULTRA 2 w/Device Kit by Does not apply route.   TRULICITY 1.5 VZ/8.5YI Sopn Generic drug:  Dulaglutide INJECT   SUBCUTANEOUSLY ONCE A WEEK   vitamin C 1000 MG tablet Take 1,000 mg by mouth daily.       Allergies: No Known Allergies  Past Medical History:  Diagnosis Date  . Back pain   . Diabetes mellitus     No past surgical history on file.  Family History  Problem Relation Age of Onset  . Diabetes Father   . Diabetes Mother   . Fibromyalgia Mother   . Heart disease Maternal Grandmother   . Gout Maternal Grandmother   . Heart attack Maternal Grandfather     Social History:  reports that she has quit smoking. She has never used smokeless tobacco. She reports that she does not drink alcohol or use drugs.    Review of Systems    She on Wellbutrin 300 mg SR both for improving her appetite and mood,    She thinks it helps her appetite but not consistently          Lipids: LDL below 100, not on statin treatment, triglycerides are also high      Lab Results  Component Value Date   CHOL 164 07/02/2018   HDL 34.10 (L) 07/02/2018   LDLCALC 89 01/31/2016   LDLDIRECT 99.0 07/02/2018   TRIG 308.0 (H) 07/02/2018   CHOLHDL 5 07/02/2018                   She has periodic numbness and burning in feet.    She also takes gabapentin as needed for restless legs at night  She may have occasional nausea especially when blood sugar is high  Last eye exam 2017, reportedly had background retinopathy and needs follow-up   Physical Examination:  BP 138/70   Pulse 96   Ht _0  (1.753 m)   Wt  242 lb (109.8 kg)   SpO2 98%   BMI 35.74 kg/m    ASSESSMENT/PLAN:  Diabetes type 2, insulin requiring with obesity  See history of present illness for detailed discussion of current diabetes management, blood sugar patterns and problems identified  Her A1c is 7.8 and not improved She likely appears to be getting more insulin deficient  She still has difficulty getting control of her diabetes because of not being able to comply with her diet Also not doing any formal exercise Surprisingly she is losing weight with continuing Invokamet and Trulicity  Her morning sugars are variable probably depending on her diet the night before but still averaging high at about 170 Has not been checking enough readings after meals and even with adding Humalog since her last visit her postprandial readings do not appear to be controlled using 10 units  Plan:  She will keep a record of before and after her blood sugar readings with her meals  She will know down what she is eating that makes her blood sugar go up  Discussed that she may need to take 14 to 18 units of Humalog if still having high readings after meals  No change in Toujeo unless her fasting readings are consistently high; although she prefers to see but this is still not covered by her insurance  Continue  Trulicity but consider Ozempic next year if covered  LIPIDS: Although she is not having a high LDL may consider a statin because of her diabetes Also has high triglycerides possibly complicated by poor diabetes control  Patient Instructions  More testing after meals  Toujeo 50 units daily         Elayne Snare 07/12/2018, 1:39 PM   Note: This office note was prepared with Dragon voice rec ognition system technology. Any transcriptional errors that result from this process are unintentional.

## 2018-07-12 NOTE — Patient Instructions (Addendum)
More testing after meals  Toujeo 50 units daily

## 2018-07-22 ENCOUNTER — Telehealth: Payer: Self-pay | Admitting: Endocrinology

## 2018-07-22 NOTE — Telephone Encounter (Signed)
Called and left VM for pt to call office back.

## 2018-07-22 NOTE — Telephone Encounter (Signed)
I am not aware of any paperwork....

## 2018-07-22 NOTE — Telephone Encounter (Signed)
Patient states they left paperwork with Dr. Lucianne MussKumar at last visit 07/12/18, and is checking on an update. Please Advise, thanks

## 2018-07-22 NOTE — Telephone Encounter (Signed)
Were you given any paperwork to fill out for this pt?

## 2018-07-22 NOTE — Telephone Encounter (Signed)
Are either of you aware of what this paperwork may be?

## 2018-07-22 NOTE — Telephone Encounter (Signed)
Her FMLA should be done tomorrow

## 2018-07-23 ENCOUNTER — Encounter: Payer: Self-pay | Admitting: Endocrinology

## 2018-07-23 DIAGNOSIS — Z0279 Encounter for issue of other medical certificate: Secondary | ICD-10-CM

## 2018-08-03 ENCOUNTER — Other Ambulatory Visit: Payer: Self-pay | Admitting: Endocrinology

## 2018-08-18 ENCOUNTER — Other Ambulatory Visit: Payer: Self-pay | Admitting: Endocrinology

## 2018-08-28 ENCOUNTER — Other Ambulatory Visit: Payer: Self-pay | Admitting: Endocrinology

## 2018-09-01 ENCOUNTER — Other Ambulatory Visit: Payer: Self-pay | Admitting: Endocrinology

## 2018-09-09 ENCOUNTER — Telehealth: Payer: Self-pay | Admitting: Endocrinology

## 2018-09-09 NOTE — Telephone Encounter (Signed)
TOUJEO MAX SOLOSTAR 300 UNIT/ML SOPN  buPROPion (WELLBUTRIN XL) 150 MG 24 hr tablet  Patient stated she is needing another coupon for these medication because she is being charged full price for both.    Please advise

## 2018-09-09 NOTE — Telephone Encounter (Signed)
Called and left detailed voicemail telling patient that there is a Toujeo copay card she can pick up,but this office does not have a Wellbutrin copay card.

## 2018-09-16 ENCOUNTER — Other Ambulatory Visit: Payer: Self-pay | Admitting: Endocrinology

## 2018-10-10 ENCOUNTER — Other Ambulatory Visit (INDEPENDENT_AMBULATORY_CARE_PROVIDER_SITE_OTHER): Payer: Commercial Managed Care - PPO

## 2018-10-10 DIAGNOSIS — E785 Hyperlipidemia, unspecified: Secondary | ICD-10-CM | POA: Diagnosis not present

## 2018-10-10 DIAGNOSIS — Z794 Long term (current) use of insulin: Secondary | ICD-10-CM

## 2018-10-10 DIAGNOSIS — E1165 Type 2 diabetes mellitus with hyperglycemia: Secondary | ICD-10-CM

## 2018-10-10 LAB — COMPREHENSIVE METABOLIC PANEL
ALT: 40 U/L — ABNORMAL HIGH (ref 0–35)
AST: 50 U/L — ABNORMAL HIGH (ref 0–37)
Albumin: 4 g/dL (ref 3.5–5.2)
Alkaline Phosphatase: 71 U/L (ref 39–117)
BUN: 9 mg/dL (ref 6–23)
CO2: 30 mEq/L (ref 19–32)
Calcium: 9.4 mg/dL (ref 8.4–10.5)
Chloride: 99 mEq/L (ref 96–112)
Creatinine, Ser: 0.59 mg/dL (ref 0.40–1.20)
GFR: 106.67 mL/min (ref >60.00)
GLUCOSE: 192 mg/dL — AB (ref 70–99)
Potassium: 4.2 mEq/L (ref 3.5–5.1)
SODIUM: 136 meq/L (ref 135–145)
Total Bilirubin: 0.5 mg/dL (ref 0.2–1.2)
Total Protein: 7.2 g/dL (ref 6.0–8.3)

## 2018-10-10 LAB — HEMOGLOBIN A1C: HEMOGLOBIN A1C: 8.8 % — AB (ref 4.6–6.5)

## 2018-10-10 LAB — MICROALBUMIN / CREATININE URINE RATIO
Creatinine,U: 84.2 mg/dL
MICROALB/CREAT RATIO: 19.3 mg/g (ref 0.0–30.0)
Microalb, Ur: 16.2 mg/dL — ABNORMAL HIGH (ref 0.0–1.9)

## 2018-10-10 LAB — LIPID PANEL
Cholesterol: 155 mg/dL (ref 0–200)
HDL: 36.7 mg/dL — ABNORMAL LOW (ref >39.00)
NonHDL: 118.67
Total CHOL/HDL Ratio: 4
Triglycerides: 248 mg/dL — ABNORMAL HIGH (ref 0.0–149.0)
VLDL: 49.6 mg/dL — ABNORMAL HIGH (ref 0.0–40.0)

## 2018-10-11 ENCOUNTER — Other Ambulatory Visit: Payer: Commercial Managed Care - PPO

## 2018-10-11 LAB — LDL CHOLESTEROL, DIRECT: Direct LDL: 97 mg/dL

## 2018-10-12 ENCOUNTER — Other Ambulatory Visit: Payer: Self-pay | Admitting: Endocrinology

## 2018-10-16 ENCOUNTER — Ambulatory Visit: Payer: Commercial Managed Care - PPO | Admitting: Endocrinology

## 2018-10-28 ENCOUNTER — Other Ambulatory Visit: Payer: Self-pay

## 2018-10-28 ENCOUNTER — Ambulatory Visit (INDEPENDENT_AMBULATORY_CARE_PROVIDER_SITE_OTHER): Payer: Commercial Managed Care - PPO | Admitting: Endocrinology

## 2018-10-28 ENCOUNTER — Encounter: Payer: Self-pay | Admitting: Endocrinology

## 2018-10-28 VITALS — BP 140/80 | HR 96 | Ht 69.0 in | Wt 249.8 lb

## 2018-10-28 DIAGNOSIS — Z794 Long term (current) use of insulin: Secondary | ICD-10-CM

## 2018-10-28 DIAGNOSIS — E782 Mixed hyperlipidemia: Secondary | ICD-10-CM | POA: Diagnosis not present

## 2018-10-28 DIAGNOSIS — E1165 Type 2 diabetes mellitus with hyperglycemia: Secondary | ICD-10-CM | POA: Diagnosis not present

## 2018-10-28 MED ORDER — SEMAGLUTIDE 7 MG PO TABS: 1.0000 | ORAL_TABLET | Freq: Every day | ORAL | 3 refills | Status: DC

## 2018-10-28 NOTE — Patient Instructions (Addendum)
Humalog 14-18 at meals  Toujeo 56 or more to get am sugar <140  Check blood sugars on waking up 4 days a week  Also check blood sugars about 2 hours after meals and do this after different meals by rotation  Recommended blood sugar levels on waking up are 90-130 and about 2 hours after meal is 130-160  Please bring your blood sugar monitor to each visit, thank you  Rybelsus 3mg  in am for 15 days and then 2 pills daily  Rx will be 7mg 

## 2018-10-28 NOTE — Progress Notes (Signed)
Patient ID: Pamela Shannon, female   DOB: [birth year], 53 y.o.   MRN: [id number]           Reason for Appointment: Endocrinology follow-up   History of Present Illness:          Diagnosis: Type 2 diabetes mellitus, date of diagnosis: ?  2011       Past history:  At diagnosis she was having symptoms of marked fatigue, dry mouth but not clear how high her sugars were. She has been on various medications for treating her diabetes, probably metformin to start with. Also around the time of diagnosis she thinks she weighed about 300 pounds. She thinks she was able to lose weight subsequently and about 2 years ago had lost down to nearly 200 pounds All the details are not available of her previous management but she appears to have had progressively worsening requiring multiple agents and additional therapies. She thinks her A1c has been over 9% for about 2 years or more, lab records are not available She probably had been on metformin along with Tanzeum about a year or so ago. Because of inadequate control she was also given Invokamet at some point.  In 9/15 she was switched from Tanzeum to Victoza and started on bedtime Levemir insulin, 20 units  Recent history:  INSULIN regimen: Toujeo 50 units, Humalog 12 units before largest meals  Non- hypoglycemic drugs the patient is taking are: Metformin, off Invokana 245YK, off Trulicity 1.5 mg weekly  Her A1c is significantly higher at 8.8  Current management, blood sugar patterns and problems identified:   She did not bring her monitor for download today  She was supposed to increase her Humalog to 14-18 units but has not done so  Also a couple of months has not taken Invokana because of high out-of-pocket expense  Also for at least a couple of weeks taking her metformin and Trulicity as she ran out  Despite having significantly high readings including fasting she did not call to report hypoglycemia  She is gaining weight  Still has not  been motivated to exercise she thinks that she is working long hours  Also not clear how often she checks her sugars  Still not planning meals well especially when working  She says that she drinks 2 bottles of juice a day      Side effects from medications have been: None Compliance with the medical regimen: Variable  Glucose monitoring:  done ?  1.   times a day         Glucometer: One Touch       Blood Glucose readings by recall:   PRE-MEAL Fasting Lunch Dinner Bedtime Overall  Glucose range: 200+      Mean/median:        POST-MEAL PC Breakfast PC Lunch PC Dinner  Glucose range:   250 +  Mean/median:      Previous readings:  PRE-MEAL Fasting Lunch Dinner Bedtime Overall  Glucose range:  94-220   111-214    Mean/median:  168     169+/-54   POST-MEAL PC Breakfast PC Lunch PC Dinner  Glucose range:   290  249, 291  Mean/median:          Self-care: The diet that the patient has been following is: Variable    Meals: 3 meals per day, evening meal at 5 PM. Breakfast is protein shake, toast or oatmeal, sometimes cereal.    Has snacks with peanut butter crackers   Usually  has unsweetened drinks, some juice  Exercise: A little walking at work         Dietician visit, most recent: 11/19/14                Weight history: Previous range 200-260  Wt Readings from Last 3 Encounters:  10/28/18 249 lb 12.8 oz (113.3 kg)  07/12/18 242 lb (109.8 kg)  04/10/18 247 lb (112 kg)      Lab Results  Component Value Date   HGBA1C 8.8 (H) 10/10/2018   HGBA1C 7.8 (H) 07/02/2018   HGBA1C 7.5 (H) 04/04/2018   Lab Results  Component Value Date   MICROALBUR 16.2 (H) 10/10/2018   LDLCALC 89 01/31/2016   CREATININE 0.59 10/10/2018    No visits with results within 1 Week(s) from this visit.  Latest known visit with results is:  Lab on 10/10/2018  Component Date Value Ref Range Status  . Cholesterol 10/10/2018 155  0 - 200 mg/dL Final   ATP III Classification       Desirable:  <  200 mg/dL               Borderline High:  200 - 239 mg/dL          High:  > = 240 mg/dL  . Triglycerides 10/10/2018 248.0* 0.0 - 149.0 mg/dL Final   Normal:  <150 mg/dLBorderline High:  150 - 199 mg/dL  . HDL 10/10/2018 36.70* >39.00 mg/dL Final  . VLDL 10/10/2018 49.6* 0.0 - 40.0 mg/dL Final  . Total CHOL/HDL Ratio 10/10/2018 4   Final                  Men          Women1/2 Average Risk     3.4          3.3Average Risk          5.0          4.42X Average Risk          9.6          7.13X Average Risk          15.0          11.0                      . NonHDL 10/10/2018 118.67   Final   NOTE:  Non-HDL goal should be 30 mg/dL higher than patient's LDL goal (i.e. LDL goal of < 70 mg/dL, would have non-HDL goal of < 100 mg/dL)  . Microalb, Ur 10/10/2018 16.2* 0.0 - 1.9 mg/dL Final  . Creatinine,U 10/10/2018 84.2  mg/dL Final  . Microalb Creat Ratio 10/10/2018 19.3  0.0 - 30.0 mg/g Final  . Sodium 10/10/2018 136  135 - 145 mEq/L Final  . Potassium 10/10/2018 4.2  3.5 - 5.1 mEq/L Final  . Chloride 10/10/2018 99  96 - 112 mEq/L Final  . CO2 10/10/2018 30  19 - 32 mEq/L Final  . Glucose, Bld 10/10/2018 192* 70 - 99 mg/dL Final  . BUN 10/10/2018 9  6 - 23 mg/dL Final  . Creatinine, Ser 10/10/2018 0.59  0.40 - 1.20 mg/dL Final  . Total Bilirubin 10/10/2018 0.5  0.2 - 1.2 mg/dL Final  . Alkaline Phosphatase 10/10/2018 71  39 - 117 U/L Final  . AST 10/10/2018 50* 0 - 37 U/L Final  . ALT 10/10/2018 40* 0 - 35 U/L Final  . Total Protein 10/10/2018 7.2  6.0 -  8.3 g/dL Final  . Albumin 10/10/2018 4.0  3.5 - 5.2 g/dL Final  . Calcium 10/10/2018 9.4  8.4 - 10.5 mg/dL Final  . GFR 10/10/2018 106.67  >60.00 mL/min Final  . Hgb A1c MFr Bld 10/10/2018 8.8* 4.6 - 6.5 % Final   Glycemic Control Guidelines for People with Diabetes:Non Diabetic:  <6%Goal of Therapy: <7%Additional Action Suggested:  >8%   . Direct LDL 10/10/2018 97.0  mg/dL Final   Optimal:  <100 mg/dLNear or Above Optimal:  100-129  mg/dLBorderline High:  130-159 mg/dLHigh:  160-189 mg/dLVery High:  >190 mg/dL      Allergies as of 10/28/2018   No Known Allergies     Medication List       Accurate as of October 28, 2018  9:33 AM. Always use your most recent med list.        buPROPion 150 MG 24 hr tablet Commonly known as:  WELLBUTRIN XL TAKE 1 TABLET BY MOUTH TWICE DAILY   gabapentin 300 MG capsule Commonly known as:  NEURONTIN Take 1 capsule (300 mg total) by mouth at bedtime.   insulin lispro 100 UNIT/ML KiwkPen Commonly known as:  HumaLOG KwikPen 10 units before meals   Invokana 300 MG Tabs tablet Generic drug:  canagliflozin TAKE 1 TABLET BY MOUTH ONCE DAILY BEFORE BREAKFAST   levonorgestrel 20 MCG/24HR IUD Commonly known as:  MIRENA 1 each by Intrauterine route once.   metFORMIN 500 MG 24 hr tablet Commonly known as:  GLUCOPHAGE-XR TAKE 4 TABLETS BY MOUTH ONCE DAILY WITH SUPPER   MULTIVITAMIN ADULT PO Take by mouth.   ONE TOUCH ULTRA 2 w/Device Kit by Does not apply route.   Semaglutide 7 MG Tabs Commonly known as:  Rybelsus Take 1 tablet by mouth daily before breakfast. Take 30 minutes before breakfast with water   Toujeo Max SoloStar 300 UNIT/ML Sopn Generic drug:  Insulin Glargine (2 Unit Dial) INJECT 50 UNITS SUBCUTANEOUSLY ONCE DAILY   Trulicity 1.5 QV/9.5GL Sopn Generic drug:  Dulaglutide INJECT   SUBCUTANEOUSLY ONCE A WEEK   vitamin C 1000 MG tablet Take 1,000 mg by mouth daily.       Allergies: No Known Allergies  Past Medical History:  Diagnosis Date  . Back pain   . Diabetes mellitus     History reviewed. No pertinent surgical history.  Family History  Problem Relation Age of Onset  . Diabetes Father   . Diabetes Mother   . Fibromyalgia Mother   . Heart disease Maternal Grandmother   . Gout Maternal Grandmother   . Heart attack Maternal Grandfather     Social History:  reports that she has quit smoking. She has never used smokeless tobacco. She reports  that she does not drink alcohol or use drugs.    Review of Systems    She on Wellbutrin 300 mg SR prescribed for improving her appetite and mood,  Recently gaining weight         Lipids: LDL below 100, not on statin treatment, triglycerides are persistently high      Lab Results  Component Value Date   CHOL 155 10/10/2018   HDL 36.70 (L) 10/10/2018   LDLCALC 89 01/31/2016   LDLDIRECT 97.0 10/10/2018   TRIG 248.0 (H) 10/10/2018   CHOLHDL 4 10/10/2018                   She has periodic numbness and burning in feet.    She also takes gabapentin as needed for restless  legs at night   Last eye exam 2017, reportedly had background retinopathy and needs follow-up, has not scheduled this   Physical Examination:  BP 140/80 (BP Location: Left Arm, Patient Position: Sitting, Cuff Size: Normal)   Pulse 96   Ht _0  (1.753 m)   Wt 249 lb 12.8 oz (113.3 kg)   SpO2 98%   BMI 36.89 kg/m   Diabetic Foot Exam - Simple   Simple Foot Form Diabetic Foot exam was performed with the following findings:  Yes 10/28/2018  9:25 AM  Visual Inspection No deformities, no ulcerations, no other skin breakdown bilaterally:  Yes Sensation Testing Intact to touch and monofilament testing bilaterally:  Yes Pulse Check Posterior Tibialis and Dorsalis pulse intact bilaterally:  Yes Comments      ASSESSMENT/PLAN:  Diabetes type 2, insulin requiring with obesity  See history of present illness for detailed discussion of current diabetes management, blood sugar patterns and problems identified  Her A1c is 8.8 Currently only on insulin and metformin  She still has difficulty affording her medications and now because of her deductible is not able to take her Invokana Also does not consistently watch her diet and has no exercise regimen Gaining weight Not able to adjust her insulin based on higher blood sugars and does not understand the differences between basal and bolus insulin and which ones  to increase She has not increased her Humalog as directed on the last visit  Plan: She will increase insulin as follows Toujeo 56 for now and go up another 2 to 4 units and less blood sugars are back below 140 Humalog 14 to 18 units at meals Start walking for exercise Cut back on juice and use Crystal light Bring monitor on each visit for download and check readings after meals Review in 6 weeks Trial of RYBELSUS, sample of 3 mg given to take 30 minutes before breakfast with water and after 2 weeks go up to 2 pills Prescription for 7 mg given and given her co-pay card with instructions on how to activate this She can restart Invokana when she can afford this Schedule eye exam  LIPIDS: High triglycerides: LDL below 100 and will continue to monitor Consider statin drug  Abnormal liver functions: May be related to fatty liver but she needs to establish with a PCP to further evaluate, given names of PCPs who are available for new patients   Patient Instructions  Humalog 14-18 at meals  Toujeo 56 or more to get am sugar <140  Check blood sugars on waking up 4 days a week  Also check blood sugars about 2 hours after meals and do this after different meals by rotation  Recommended blood sugar levels on waking up are 90-130 and about 2 hours after meal is 130-160  Please bring your blood sugar monitor to each visit, thank you  Rybelsus 22m in am for 15 days and then 2 pills daily  Rx will be 744m  Counseling time on subjects discussed in assessment and plan sections is over 50% of today's 25 minute visit     AjElayne Snare/04/2019, 9:33 AM   Note: This office note was prepared with Dragon voice rec ognition system technology. Any transcriptional errors that result from this process are unintentional.

## 2018-10-30 ENCOUNTER — Other Ambulatory Visit: Payer: Self-pay | Admitting: Endocrinology

## 2018-10-30 ENCOUNTER — Other Ambulatory Visit: Payer: Self-pay

## 2018-10-30 ENCOUNTER — Telehealth: Payer: Self-pay | Admitting: Endocrinology

## 2018-10-30 MED ORDER — EXENATIDE ER 2 MG/0.85ML ~~LOC~~ AUIJ: 2.0000 mg | AUTO-INJECTOR | SUBCUTANEOUS | 2 refills | Status: DC

## 2018-10-30 NOTE — Telephone Encounter (Signed)
Even with the copay card she thinks Trulicity may be over $600 but she is willing to check and see. Please call trulicity into the [facility] on [address] and she will see what the pharmacy says

## 2018-10-30 NOTE — Telephone Encounter (Signed)
She is supposed to be taking the 3 mg sample for the first 2 weeks at least before going to 2 tablets and then subsequently 7 mg if tolerated

## 2018-10-30 NOTE — Telephone Encounter (Signed)
Please advise 

## 2018-10-30 NOTE — Telephone Encounter (Signed)
She would like to see if she can afford Trulicity again with the co-pay card

## 2018-10-30 NOTE — Telephone Encounter (Signed)
Semaglutide (RYBELSUS) 7 MG TABS Patient started the above medication on Tuesday she stated she is having abdominal pain and diarrhea . She would like to know if this is just something that happens when starting a new medication and  will eventually stop  Please advise

## 2018-10-30 NOTE — Telephone Encounter (Signed)
Pt stated that she is taking the 3mg  sample that she was given in the office. She stated that the pain starts approx. 25-30 minutes after taking the pill. Pt is taking it as recommended.  Pt stated that she had to call into work the past two days because the stomach pain was so severe.

## 2018-10-30 NOTE — Telephone Encounter (Signed)
Since she has a high deductible I have sent prescription for Bydureon 2 mg weekly.  Please have her pick up a co-pay card or get one printed online that usually has more value than Trulicity co-pay card.  She may also need to be shown how to do the injection

## 2018-10-30 NOTE — Telephone Encounter (Signed)
What dose?

## 2018-11-01 NOTE — Telephone Encounter (Signed)
Called and left detailed voicemail with MD message. 

## 2018-11-04 ENCOUNTER — Other Ambulatory Visit: Payer: Self-pay | Admitting: Endocrinology

## 2018-11-06 ENCOUNTER — Telehealth: Payer: Self-pay | Admitting: Endocrinology

## 2018-11-06 NOTE — Telephone Encounter (Signed)
Called and left detailed voicemail for pt with MD message. 

## 2018-11-06 NOTE — Telephone Encounter (Signed)
She will need to keep going up on her Toujeo by 4 units weekly until morning sugars are below 140.  Also to exercise regularly and be strict on her diet

## 2018-11-06 NOTE — Telephone Encounter (Signed)
Patient called re: Rx for Exenatide ER (BYDUREON BCISE) 2 MG/0.85ML AUIJ costs $600 plus with insurance and the discount card. Patient cannot afford the above medication. Please call patient at ph# [phone] to advise. Pharmacist told patient that the discount card lowers the cost approx. $100 in total savings throughout the use of medication.

## 2018-11-06 NOTE — Telephone Encounter (Signed)
FYI

## 2018-11-10 ENCOUNTER — Other Ambulatory Visit: Payer: Self-pay | Admitting: Endocrinology

## 2018-11-22 ENCOUNTER — Ambulatory Visit: Payer: Commercial Managed Care - PPO | Admitting: Internal Medicine

## 2018-12-10 ENCOUNTER — Ambulatory Visit: Payer: Commercial Managed Care - PPO | Admitting: Endocrinology

## 2018-12-11 ENCOUNTER — Other Ambulatory Visit: Payer: Self-pay | Admitting: Endocrinology

## 2018-12-13 ENCOUNTER — Other Ambulatory Visit: Payer: Self-pay | Admitting: Endocrinology

## 2018-12-19 ENCOUNTER — Telehealth: Payer: Self-pay

## 2018-12-19 NOTE — Telephone Encounter (Signed)
This should be done by her PCP. 

## 2018-12-19 NOTE — Telephone Encounter (Addendum)
Patient called today and stated that an employee at her job tested positive for Covid-19 and she wants to know if she can get a note to stay home and self quarantine  for 2 weeks due to being high risk -this note would need to start on 5/5- please advise patient would like note faxed to [place] at [phone]

## 2018-12-23 ENCOUNTER — Encounter: Payer: Self-pay | Admitting: Endocrinology

## 2018-12-23 ENCOUNTER — Telehealth: Payer: Self-pay | Admitting: Endocrinology

## 2018-12-23 NOTE — Telephone Encounter (Signed)
I will print a letter by the end of the day

## 2018-12-23 NOTE — Telephone Encounter (Signed)
Letter has been printed, needs signature

## 2018-12-23 NOTE — Telephone Encounter (Signed)
[facility] called stating that patient has called them in regards to her wanting a letter for work. Patient does not have a PCP. She does not feel comfortable going to work with her Diabetes.  Please Advise, Thanks  [facility] stated they would connect with her on getting her established with a PCP.

## 2018-12-24 NOTE — Telephone Encounter (Signed)
This has been documented elsewhere in pt's chart. Pt was given a copy of the letter in addition to having this faxed.

## 2018-12-24 NOTE — Telephone Encounter (Signed)
Documentation

## 2018-12-24 NOTE — Telephone Encounter (Signed)
Called pt and left detailed voicemail informing her that letter was available for pick up or can be mailed if she prefers. Pt was requested to call the office back and inform us of whether she would like it mailed or not.

## 2019-01-15 ENCOUNTER — Other Ambulatory Visit: Payer: Self-pay | Admitting: Endocrinology

## 2019-01-19 ENCOUNTER — Other Ambulatory Visit: Payer: Self-pay | Admitting: Endocrinology

## 2019-01-23 ENCOUNTER — Telehealth: Payer: Self-pay

## 2019-01-23 NOTE — Telephone Encounter (Signed)
Patient called today and stated someone called her but she does not know why- I did review her chart and could not find a reason for the call

## 2019-01-23 NOTE — Telephone Encounter (Signed)
No one called from this office.

## 2019-01-27 ENCOUNTER — Other Ambulatory Visit: Payer: Self-pay | Admitting: Endocrinology

## 2019-01-27 ENCOUNTER — Other Ambulatory Visit: Payer: Self-pay

## 2019-01-27 ENCOUNTER — Other Ambulatory Visit (INDEPENDENT_AMBULATORY_CARE_PROVIDER_SITE_OTHER): Payer: Commercial Managed Care - PPO

## 2019-01-27 DIAGNOSIS — Z794 Long term (current) use of insulin: Secondary | ICD-10-CM | POA: Diagnosis not present

## 2019-01-27 DIAGNOSIS — E1165 Type 2 diabetes mellitus with hyperglycemia: Secondary | ICD-10-CM

## 2019-01-27 LAB — COMPREHENSIVE METABOLIC PANEL
ALT: 40 U/L — ABNORMAL HIGH (ref 0–35)
AST: 46 U/L — ABNORMAL HIGH (ref 0–37)
Albumin: 4.1 g/dL (ref 3.5–5.2)
Alkaline Phosphatase: 77 U/L (ref 39–117)
BUN: 10 mg/dL (ref 6–23)
CO2: 29 mEq/L (ref 19–32)
Calcium: 9.3 mg/dL (ref 8.4–10.5)
Chloride: 97 mEq/L (ref 96–112)
Creatinine, Ser: 0.57 mg/dL (ref 0.40–1.20)
GFR: 110.87 mL/min (ref >60.00)
Glucose, Bld: 248 mg/dL — ABNORMAL HIGH (ref 70–99)
Potassium: 4.3 mEq/L (ref 3.5–5.1)
Sodium: 133 mEq/L — ABNORMAL LOW (ref 135–145)
Total Bilirubin: 0.5 mg/dL (ref 0.2–1.2)
Total Protein: 7.5 g/dL (ref 6.0–8.3)

## 2019-01-27 LAB — HEMOGLOBIN A1C: Hgb A1c MFr Bld: 10.8 % — ABNORMAL HIGH (ref 4.6–6.5)

## 2019-01-29 ENCOUNTER — Encounter: Payer: Self-pay | Admitting: Endocrinology

## 2019-01-29 ENCOUNTER — Ambulatory Visit (INDEPENDENT_AMBULATORY_CARE_PROVIDER_SITE_OTHER): Payer: Commercial Managed Care - PPO | Admitting: Endocrinology

## 2019-01-29 ENCOUNTER — Other Ambulatory Visit: Payer: Self-pay

## 2019-01-29 DIAGNOSIS — Z794 Long term (current) use of insulin: Secondary | ICD-10-CM

## 2019-01-29 DIAGNOSIS — E1165 Type 2 diabetes mellitus with hyperglycemia: Secondary | ICD-10-CM | POA: Diagnosis not present

## 2019-01-29 DIAGNOSIS — R945 Abnormal results of liver function studies: Secondary | ICD-10-CM

## 2019-01-29 MED ORDER — EXENATIDE ER 2 MG/0.85ML ~~LOC~~ AUIJ: 2.0000 mg | AUTO-INJECTOR | SUBCUTANEOUS | 1 refills | Status: DC

## 2019-01-29 NOTE — Progress Notes (Signed)
Patient ID: Pamela Shannon, female   DOB: 1965/09/16, 53 y.o.   MRN: [id number]           Reason for Appointment: Endocrinology follow-up  Today's office visit was provided via telemedicine using a telephone call to the patient Patient has been explained the limitations of evaluation and management by telemedicine and the availability of in person appointments.  The patient understood the limitations and agreed to proceed. Patient also understood that the telehealth visit is billable. . Location of the patient: Home . Location of the provider: Office Only the patient and myself were participating in the encounter   History of Present Illness:          Diagnosis: Type 2 diabetes mellitus, date of diagnosis: ?  2011       Past history:  At diagnosis she was having symptoms of marked fatigue, dry mouth but not clear how high her sugars were. She has been on various medications for treating her diabetes, probably metformin to start with. Also around the time of diagnosis she thinks she weighed about 300 pounds. She thinks she was able to lose weight subsequently and about 2 years ago had lost down to nearly 200 pounds All the details are not available of her previous management but she appears to have had progressively worsening requiring multiple agents and additional therapies. She thinks her A1c has been over 9% for about 2 years or more, lab records are not available She probably had been on metformin along with Tanzeum about a year or so ago. Because of inadequate control she was also given Invokamet at some point.  In 9/15 she was switched from Tanzeum to [place] and started on bedtime Levemir insulin, 20 units  She has been tried on Ozempic and Trulicity, both stopped because of cost  Recent history:  INSULIN regimen: Toujeo 60 units, Humalog 18 units before largest meal  Non- hypoglycemic drugs the patient is taking are: Metformin 1 g twice daily  Her A1c is significantly higher  at 10.8, previously 8.8  Current management, blood sugar patterns and problems identified:   She stated that she was having higher blood sugars when she was having stress with her work  Otherwise when she was off from work her blood sugars have come down to as low as 147 fasting but only briefly  Blood sugars were not reviewed in detail but averages are significantly higher for 7, 14 and 30-day readings  She has not checked readings after meals in the last week  Despite asking her to exercise she does not do any and only does a little bit of walking in her parking lot  She is still not watching her drinks and is getting juice and sweet green tea  She does not think she has lost any weight despite her blood sugars being higher  Did not tolerate Rybelsus because of nausea  Also cannot afford other medications such as Invokana  She was sent a prescription for Bydureon but she did not pick it up, was told to use the co-pay card also which she did not procure unlikely was too expensive      Side effects from medications have been: None Compliance with the medical regimen: Variable  Glucose monitoring:  done ?  1.   times a day         Glucometer: One Touch ultra 2      Blood Glucose readings by patient review of her monitor:   PRE-MEAL Fasting [employer]  Bedtime Overall  Glucose range:  147-200+      Mean/median:  229     250   POST-MEAL PC Breakfast PC Lunch PC Dinner  Glucose range:   250 +  Mean/median:    341        Self-care: The diet that the patient has been following is: Variable    Meals: 3 meals per day, evening meal at 5 PM. Breakfast is protein shake, toast or oatmeal, sometimes cereal.    Has snacks with peanut butter crackers    Exercise: A little walking at work         [place] visit, most recent: 11/19/14                Weight history: Previous range 200-260  Wt Readings from Last 3 Encounters:  10/28/18 249 lb 12.8 oz (113.3 kg)  07/12/18 242 lb  (109.8 kg)  04/10/18 247 lb (112 kg)      Lab Results  Component Value Date   HGBA1C 10.8 (H) 01/27/2019   HGBA1C 8.8 (H) 10/10/2018   HGBA1C 7.8 (H) 07/02/2018   Lab Results  Component Value Date   MICROALBUR 16.2 (H) 10/10/2018   LDLCALC 89 01/31/2016   CREATININE 0.57 01/27/2019    Lab on 01/27/2019  Component Date Value Ref Range Status  . Sodium 01/27/2019 133* 135 - 145 mEq/L Final  . Potassium 01/27/2019 4.3  3.5 - 5.1 mEq/L Final  . Chloride 01/27/2019 97  96 - 112 mEq/L Final  . CO2 01/27/2019 29  19 - 32 mEq/L Final  . Glucose, Bld 01/27/2019 248* 70 - 99 mg/dL Final  . BUN 01/27/2019 10  6 - 23 mg/dL Final  . Creatinine, Ser 01/27/2019 0.57  0.40 - 1.20 mg/dL Final  . Total Bilirubin 01/27/2019 0.5  0.2 - 1.2 mg/dL Final  . Alkaline Phosphatase 01/27/2019 77  39 - 117 U/L Final  . AST 01/27/2019 46* 0 - 37 U/L Final  . ALT 01/27/2019 40* 0 - 35 U/L Final  . Total Protein 01/27/2019 7.5  6.0 - 8.3 g/dL Final  . Albumin 01/27/2019 4.1  3.5 - 5.2 g/dL Final  . Calcium 01/27/2019 9.3  8.4 - 10.5 mg/dL Final  . GFR 01/27/2019 110.87  >60.00 mL/min Final  . Hgb A1c MFr Bld 01/27/2019 10.8* 4.6 - 6.5 % Final   Glycemic Control Guidelines for People with Diabetes:Non Diabetic:  <6%Goal of Therapy: <7%Additional Action Suggested:  >8%       Allergies as of 01/29/2019   No Known Allergies     Medication List       Accurate as of January 29, 2019  4:13 PM. If you have any questions, ask your nurse or doctor.        buPROPion 150 MG 24 hr tablet Commonly known as:  WELLBUTRIN XL TAKE 1 TABLET BY MOUTH TWICE DAILY   Exenatide ER 2 MG/0.85ML Auij Commonly known as:  Bydureon BCise Inject 2 mg into the skin once a week.   gabapentin 300 MG capsule Commonly known as:  NEURONTIN Take 1 capsule (300 mg total) by mouth at bedtime.   HumaLOG KwikPen 100 UNIT/ML KwikPen Generic drug:  insulin lispro INJECT 10 UNITS SUBCUTANEOUSLY BEFORE MEAL(S)   Invokana 300 MG  Tabs tablet Generic drug:  canagliflozin TAKE 1 TABLET BY MOUTH ONCE DAILY BEFORE BREAKFAST   levonorgestrel 20 MCG/24HR IUD Commonly known as:  MIRENA 1 each by Intrauterine route once.   metFORMIN 500 MG 24  hr tablet Commonly known as:  GLUCOPHAGE-XR TAKE 4 TABLETS BY MOUTH ONCE DAILY WITH SUPPER   MULTIVITAMIN ADULT PO Take by mouth.   ONE TOUCH ULTRA 2 w/Device Kit by Does not apply route.   Semaglutide 7 MG Tabs Commonly known as:  Rybelsus Take 1 tablet by mouth daily before breakfast. Take 30 minutes before breakfast with water   Toujeo Max SoloStar 300 UNIT/ML Sopn Generic drug:  Insulin Glargine (2 Unit Dial) INJECT 50 UNITS SUBCUTANEOUSLY ONCE DAILY   Trulicity 1.5 BT/5.9RC Sopn Generic drug:  Dulaglutide INJECT   SUBCUTANEOUSLY ONCE A WEEK   vitamin C 1000 MG tablet Take 1,000 mg by mouth daily.       Allergies: No Known Allergies  Past Medical History:  Diagnosis Date  . Back pain   . Diabetes mellitus     No past surgical history on file.  Family History  Problem Relation Age of Onset  . Diabetes Father   . Diabetes Mother   . Fibromyalgia Mother   . Heart disease Maternal Grandmother   . Gout Maternal Grandmother   . Heart attack Maternal Grandfather     Social History:  reports that she has quit smoking. She has never used smokeless tobacco. She reports that she does not drink alcohol or use drugs.    Review of Systems    She on Wellbutrin 300 mg SR prescribed for improving her appetite and mood         Lipids: LDL below 100, not on statin treatment, triglycerides are usually high      Lab Results  Component Value Date   CHOL 155 10/10/2018   HDL 36.70 (L) 10/10/2018   LDLCALC 89 01/31/2016   LDLDIRECT 97.0 10/10/2018   TRIG 248.0 (H) 10/10/2018   CHOLHDL 4 10/10/2018               Has persistently abnormal functions  Lab Results  Component Value Date   ALT 40 (H) 01/27/2019   ALT 40 (H) 10/10/2018   ALT 42 (H)  07/02/2018        She has periodic numbness and burning in feet.   Last foot exam 10/2018  She also takes gabapentin as needed for restless legs at night   Last eye exam 2017, reportedly had background retinopathy Has not followed up on this and probably cannot afford the co-pay   Physical Examination:  There were no vitals taken for this visit.   ASSESSMENT/PLAN:  Diabetes type 2, insulin requiring with obesity  See history of present illness for detailed discussion of current diabetes management, blood sugar patterns and problems identified  Her A1c is significantly higher at 10.8  Again she is only on insulin and metformin  She still has difficulty affording her brand-name medications mostly because of having a high deductible Despite increasing her insulin she is still having worsening of her control She can also do somewhat better with her diet and exercise regimen  Although she is taking mealtime insulin at dinnertime she may be having higher readings at other meals also when she does not check Fasting readings are averaging well over 200; she had a reading of 248 in the lab  Plan: She will increase insulin as follows Toujeo 60 for now and go up another 2 to 4 units if blood sugars continue to stay high in the morning Humalog 22 units at her dinner meals Start checking blood sugars after breakfast and lunch and may start Humalog high-dose times also  if needed  Start walking for exercise Stop drinking juices and sweet green tea  Discussed blood sugar targets after meals also Emphasized the need for better control for long-term benefit  She will try Bydureon to see if it is less expensive and will mail her a co-pay card Discussed that she will need to follow instructions for the injection technique and let us know if she needs any guidance  Otherwise may need to try and get her on a patient assistance program at least for Invokana  Abnormal liver functions:  These are persistent and May be related to fatty liver  She also needs to keep her July appointment with her new PCP for general care  There are no Patient Instructions on file for this visit.   Counseling time on subjects discussed in assessment and plan sections is over 50% of today's 25 minute visit     Elayne Snare 01/29/2019, 4:13 PM   Note: This office note was prepared with Dragon voice rec ognition system technology. Any transcriptional errors that result from this process are unintentional.

## 2019-02-04 ENCOUNTER — Telehealth: Payer: Self-pay

## 2019-02-04 NOTE — Telephone Encounter (Signed)
Called and left voicemail for pt informing her that I mailed the copay card last week.

## 2019-02-04 NOTE — Telephone Encounter (Signed)
Patient called today and stated Dr. Dwyane Dee was supposed to be sending Bydureon coupon and her AVS from 01/29/19 in the mail and she wants to know if this has happened and if not does she need to come pick it up

## 2019-02-07 NOTE — Telephone Encounter (Signed)
PA completed via [email or web address] for Bydureon pen 2mg  weekly.  KeyMarge Duncans  PA Case ID: [id number]   Rx #: [id number]

## 2019-02-12 ENCOUNTER — Telehealth: Payer: Self-pay | Admitting: Endocrinology

## 2019-02-12 ENCOUNTER — Other Ambulatory Visit: Payer: Self-pay | Admitting: Endocrinology

## 2019-02-12 NOTE — Telephone Encounter (Signed)
LMTCB and schedule labs and f/u appointment with Dr Dwyane Dee (1st couple of weeks in August 2020)

## 2019-02-24 ENCOUNTER — Telehealth: Payer: Self-pay | Admitting: Endocrinology

## 2019-02-24 NOTE — Telephone Encounter (Signed)
Patient called to advise that she has not yet received the [place] and she has questions about the Prior Auth for the medication with CoverMyMeds and what this means to her

## 2019-02-24 NOTE — Telephone Encounter (Signed)
Called pt and informed her that [email or web address] is just the website that we use to do the PA paperwork, and this does not mean anything in particular for her. She was also informed that I would place another copay card in the mail for her. Pt verbalized understanding.

## 2019-02-25 NOTE — Telephone Encounter (Signed)
Bydureon savings card was placed in the mail for pt today.

## 2019-02-26 ENCOUNTER — Ambulatory Visit: Payer: Commercial Managed Care - PPO | Admitting: Family Medicine

## 2019-03-01 ENCOUNTER — Other Ambulatory Visit: Payer: Self-pay | Admitting: Endocrinology

## 2019-03-02 ENCOUNTER — Other Ambulatory Visit: Payer: Self-pay | Admitting: Endocrinology

## 2019-03-18 ENCOUNTER — Telehealth: Payer: Self-pay | Admitting: Endocrinology

## 2019-03-18 NOTE — Telephone Encounter (Signed)
Patient has an appointment for labs 03/31/2019 and Dr Dwyane Dee on Friday 04/04/2019.  She has only on the new Bydureon for 2 weeks now and is getting ready to take her 3rd dose.  She wants to know if she should delay her labs and appointment so that she will have been on medicine longer.

## 2019-03-18 NOTE — Telephone Encounter (Signed)
Okay, she can delay her appointment by about a month.  However she needs to make sure she is checking her sugars twice a day including some readings after meals consistently

## 2019-03-19 NOTE — Telephone Encounter (Signed)
Called pt and gave her MD message. Pt verbalized understanding. Pt stated that her work schedule was about to change and when she finds out what her new schedule is like, she will call and schedule a new appt with bloodwork.

## 2019-03-26 ENCOUNTER — Other Ambulatory Visit: Payer: Self-pay | Admitting: Endocrinology

## 2019-03-31 ENCOUNTER — Other Ambulatory Visit: Payer: Commercial Managed Care - PPO

## 2019-04-02 ENCOUNTER — Ambulatory Visit: Payer: Commercial Managed Care - PPO | Admitting: Endocrinology

## 2019-04-04 ENCOUNTER — Ambulatory Visit: Payer: Commercial Managed Care - PPO | Admitting: Endocrinology

## 2019-04-09 ENCOUNTER — Telehealth: Payer: Self-pay | Admitting: Endocrinology

## 2019-04-09 NOTE — Telephone Encounter (Signed)
Patient has dropped off paperwork to be filled out by Dr.Kumar. Please call patient once completed.  Placed in doctors box.

## 2019-04-09 NOTE — Telephone Encounter (Signed)
Noted  

## 2019-04-11 ENCOUNTER — Other Ambulatory Visit: Payer: Self-pay | Admitting: Endocrinology

## 2019-04-15 DIAGNOSIS — Z0279 Encounter for issue of other medical certificate: Secondary | ICD-10-CM

## 2019-04-16 ENCOUNTER — Encounter: Payer: Self-pay | Admitting: Endocrinology

## 2019-04-16 NOTE — Telephone Encounter (Signed)
Paperwork was completed. Copies were made and one copy sent to medical records. Original copy will go to patient. Called pt and left detailed voicemail informing her that it was ready for pick up or mail. Requested  Call back for pt to inform us whether she would like to pick it up or have it mailed.

## 2019-04-23 ENCOUNTER — Other Ambulatory Visit: Payer: Self-pay | Admitting: Endocrinology

## 2019-05-05 ENCOUNTER — Other Ambulatory Visit (INDEPENDENT_AMBULATORY_CARE_PROVIDER_SITE_OTHER): Payer: Commercial Managed Care - PPO

## 2019-05-05 ENCOUNTER — Other Ambulatory Visit: Payer: Self-pay | Admitting: Endocrinology

## 2019-05-05 ENCOUNTER — Other Ambulatory Visit: Payer: Self-pay

## 2019-05-05 DIAGNOSIS — E1165 Type 2 diabetes mellitus with hyperglycemia: Secondary | ICD-10-CM

## 2019-05-05 DIAGNOSIS — Z794 Long term (current) use of insulin: Secondary | ICD-10-CM

## 2019-05-05 LAB — GLUCOSE, RANDOM: Glucose, Bld: 329 mg/dL — ABNORMAL HIGH (ref 70–99)

## 2019-05-05 LAB — HEMOGLOBIN A1C: Hgb A1c MFr Bld: 10.1 % — ABNORMAL HIGH (ref 4.6–6.5)

## 2019-05-06 ENCOUNTER — Ambulatory Visit: Payer: Commercial Managed Care - PPO | Admitting: Endocrinology

## 2019-05-06 LAB — FRUCTOSAMINE: Fructosamine: 374 umol/L — ABNORMAL HIGH (ref 0–285)

## 2019-05-12 ENCOUNTER — Encounter: Payer: Self-pay | Admitting: Endocrinology

## 2019-05-12 ENCOUNTER — Other Ambulatory Visit: Payer: Self-pay

## 2019-05-12 ENCOUNTER — Ambulatory Visit (INDEPENDENT_AMBULATORY_CARE_PROVIDER_SITE_OTHER): Payer: Commercial Managed Care - PPO | Admitting: Endocrinology

## 2019-05-12 VITALS — BP 144/80 | HR 114 | Ht 69.0 in | Wt 254.0 lb

## 2019-05-12 DIAGNOSIS — Z23 Encounter for immunization: Secondary | ICD-10-CM

## 2019-05-12 DIAGNOSIS — R945 Abnormal results of liver function studies: Secondary | ICD-10-CM | POA: Diagnosis not present

## 2019-05-12 DIAGNOSIS — E1165 Type 2 diabetes mellitus with hyperglycemia: Secondary | ICD-10-CM | POA: Diagnosis not present

## 2019-05-12 DIAGNOSIS — Z794 Long term (current) use of insulin: Secondary | ICD-10-CM

## 2019-05-12 DIAGNOSIS — E6609 Other obesity due to excess calories: Secondary | ICD-10-CM

## 2019-05-12 MED ORDER — FARXIGA 10 MG PO TABS: 10.0000 mg | ORAL_TABLET | Freq: Every day | ORAL | 3 refills | Status: DC

## 2019-05-12 MED ORDER — TRESIBA FLEXTOUCH 200 UNIT/ML ~~LOC~~ SOPN: 80.0000 [IU] | PEN_INJECTOR | Freq: Every day | SUBCUTANEOUS | 1 refills | Status: DC

## 2019-05-12 NOTE — Progress Notes (Signed)
Patient ID: Pamela Shannon, female   DOB: [birth year], 53 y.o.   MRN: [id number]           Reason for Appointment: Endocrinology follow-up   History of Present Illness:          Diagnosis: Type 2 diabetes mellitus, date of diagnosis: ?  2011       Past history:  At diagnosis she was having symptoms of marked fatigue, dry mouth but not clear how high her sugars were. She has been on various medications for treating her diabetes, probably metformin to start with. Also around the time of diagnosis she thinks she weighed about 300 pounds. She thinks she was able to lose weight subsequently and about 2 years ago had lost down to nearly 200 pounds All the details are not available of her previous management but she appears to have had progressively worsening requiring multiple agents and additional therapies. She thinks her A1c has been over 9% for about 2 years or more, lab records are not available She probably had been on metformin along with Tanzeum about a year or so ago. Because of inadequate control she was also given Invokamet at some point.  In 9/15 she was switched from Tanzeum to [place] and started on bedtime Levemir insulin, 20 units  She has been tried on Ozempic and Trulicity, both stopped because of cost  Recent history:  INSULIN regimen: Toujeo 62 units, Humalog 22 units before largest meal  Non- hypoglycemic drugs the patient is taking are: Metformin 1 g twice daily  Her A1c is still relatively high at 10.1 compared to 10.8   Current management, blood sugar patterns and problems identified:   She was apparently able to take Bydureon probably in July and August but she does not think her blood sugars improved with this at all  Also A1c has not improved significantly  This is despite her saying that she is cutting back on her portions significantly, trying to eat more salads at lunch  She is also trying to walk a little more than usual  Surprisingly has not lost any  weight and since March appears to have gained 5 pounds  She was told to increase her insulin and she has only increased her Humalog but not Toujeo any further despite high fasting readings  She has check blood sugars only in the mornings, somewhat different times on different days based on her work schedule  She has not checked readings after meals recently because previously but glucose would be consistently high and highest reading was 475, 6 weeks ago  Previously could not afford other diabetes medications such as Invokana  No hypoglycemia      Side effects from medications have been: None Compliance with the medical regimen: Variable  Glucose monitoring:  done ?  1.   times a day         Glucometer: One Touch ultra 2      Blood Glucose readings  Recent range 108-349 with only 1 normal reading and overall AVERAGE 285  Previous average 250    Self-care: The diet that the patient has been following is: Variable    Meals: 3 meals per day, evening meal at 5 PM. Breakfast is protein shake, toast or oatmeal, sometimes cereal.    Has snacks with peanut butter crackers    Exercise: A little walking at work         Dietician visit, most recent: 11/19/14  Weight history: Previous range 200-260  Wt Readings from Last 3 Encounters:  05/12/19 254 lb (115.2 kg)  10/28/18 249 lb 12.8 oz (113.3 kg)  07/12/18 242 lb (109.8 kg)      Lab Results  Component Value Date   HGBA1C 10.1 (H) 05/05/2019   HGBA1C 10.8 (H) 01/27/2019   HGBA1C 8.8 (H) 10/10/2018   Lab Results  Component Value Date   MICROALBUR 16.2 (H) 10/10/2018   LDLCALC 89 01/31/2016   CREATININE 0.57 01/27/2019    No visits with results within 1 Week(s) from this visit.  Latest known visit with results is:  Lab on 05/05/2019  Component Date Value Ref Range Status  . Fructosamine 05/05/2019 374* 0 - 285 umol/L Final   Comment: Published reference interval for apparently healthy subjects between age 33  and 89 is 31 - 285 umol/L and in a poorly controlled diabetic population is 228 - 563 umol/L with a mean of 396 umol/L.   [name] Glucose, Bld 05/05/2019 329* 70 - 99 mg/dL Final  . Hgb A1c MFr Bld 05/05/2019 10.1* 4.6 - 6.5 % Final   Glycemic Control Guidelines for People with Diabetes:Non Diabetic:  <6%Goal of Therapy: <7%Additional Action Suggested:  >8%       Allergies as of 05/12/2019   No Known Allergies     Medication List       Accurate as of May 12, 2019  9:07 PM. If you have any questions, ask your nurse or doctor.        STOP taking these medications   Invokana 300 MG Tabs tablet Generic drug: canagliflozin Stopped by: Elayne Snare, MD     TAKE these medications   buPROPion 150 MG 24 hr tablet Commonly known as: WELLBUTRIN XL Take 1 tablet by mouth twice daily   Bydureon BCise 2 MG/0.85ML Auij Generic drug: Exenatide ER INJECT 2MG INTO THE SKIN ONCE A WEEK   Farxiga 10 MG Tabs tablet Generic drug: dapagliflozin propanediol Take 10 mg by mouth daily before breakfast. Started by: Elayne Snare, MD   gabapentin 300 MG capsule Commonly known as: NEURONTIN Take 1 capsule (300 mg total) by mouth at bedtime. What changed:   when to take this  reasons to take this   HumaLOG KwikPen 100 UNIT/ML KwikPen Generic drug: insulin lispro INJECT 10 UNITS SUBCUTANEOUSLY BEFORE MEAL(S)   levonorgestrel 20 MCG/24HR IUD Commonly known as: MIRENA 1 each by Intrauterine route once.   metFORMIN 500 MG 24 hr tablet Commonly known as: GLUCOPHAGE-XR TAKE 4 TABLETS BY MOUTH ONCE DAILY WITH SUPPER   MULTIVITAMIN ADULT PO Take by mouth.   ONE TOUCH ULTRA 2 w/Device Kit by Does not apply route.   Toujeo Max SoloStar 300 UNIT/ML Sopn Generic drug: Insulin Glargine (2 Unit Dial) INJECT 50 UNITS SUBCUTANEOUSLY ONCE DAILY   vitamin C 1000 MG tablet Take 1,000 mg by mouth daily.       Allergies: No Known Allergies  Past Medical History:  Diagnosis Date  . Back  pain   . Diabetes mellitus     History reviewed. No pertinent surgical history.  Family History  Problem Relation Age of Onset  . Diabetes Father   . Diabetes Mother   . Fibromyalgia Mother   . Heart disease Maternal Grandmother   . Gout Maternal Grandmother   . Heart attack Maternal Grandfather     Social History:  reports that she has quit smoking. She has never used smokeless tobacco. She reports that she does not drink alcohol  or use drugs.    Review of Systems    She on Wellbutrin 300 mg SR prescribed for improving her appetite and mood         Lipids: LDL below 100, not on statin treatment, triglycerides are usually high      Lab Results  Component Value Date   CHOL 155 10/10/2018   HDL 36.70 (L) 10/10/2018   LDLCALC 89 01/31/2016   LDLDIRECT 97.0 10/10/2018   TRIG 248.0 (H) 10/10/2018   CHOLHDL 4 10/10/2018               Has persistently abnormal functions She says that she is taking an OTC supplement for fatty liver, does not know the name  Lab Results  Component Value Date   ALT 40 (H) 01/27/2019   ALT 40 (H) 10/10/2018   ALT 42 (H) 07/02/2018        She has periodic numbness and burning in feet.  + Last foot exam 10/2018  She also takes gabapentin as needed for restless legs at night  Last eye exam 2017, reportedly had background retinopathy Has not followed up on this and probably cannot afford the co-pay   Possible hypertension: Her blood pressure appears to be high normal although she was rushing and stressed today  BP Readings from Last 3 Encounters:  05/12/19 (!) 144/80  10/28/18 140/80  07/12/18 138/70     Physical Examination:  BP (!) 144/80 (Cuff Size: Large)   Pulse (!) 114   Ht '5\' 9"'$  (1.753 m)   Wt 254 lb (115.2 kg)   SpO2 97%   BMI 37.51 kg/m    ASSESSMENT/PLAN:  Diabetes type 2, insulin requiring with obesity  See history of present illness for detailed discussion of current diabetes management, blood sugar patterns  and problems identified  Her A1c is 10.1  Again she is only on insulin and metformin  Although she did try taking Bydureon with her insulin she does not appear to have had any improvement in her blood sugars with 90-day average 295 including readings after lunch and dinner in the past  She has tried to do better with her diet and exercise but despite this is not losing weight  She has not adjusted her Toujeo despite persistently high fasting blood sugars  Plan: She will increase insulin as follows  Toujeo 80 units daily  She was given a flowsheet for adjusting the dose based on fasting blood sugars every 3 days by 5 units until morning sugars are at least below 150  She will add Humalog at lunchtime, 14 to 18 units based on her meals and likely needs to take these for sandwiches or fast food meals  Humalog may need to be increased after dinner but will need to monitor again  Start checking blood sugars after meals again  We will also try approval for Tyler Aas since she thinks this had worked better than Toujeo and currently has failed large doses of Toujeo  Increase walking for exercise Stop drinking juices and sweet tea completely  Start FARXIGA 10 mg daily She will be able to get this covered as per her insurance instead of Invokana Discussed how this works and benefits Given co-pay card and free 30-day coupon Recheck renal function in 6 weeks  Discouraged her from taking keto tablets OTC  Abnormal liver functions: Probably from fatty liver and will recheck on the next visit  Will blood pressure closely and also watch urine microalbumin regularly  Influenza vaccine given  Patient Instructions  toujeo 80 units daily  Check blood sugars on waking up days a week  Also check blood sugars about 2 hours after meals and do this after different meals by rotation  Recommended blood sugar levels on waking up are 90-130 and about 2 hours after meal is 130-160  Please bring  your blood sugar monitor to each visit, thank you  Humalog take 16-18 units if eating   Farxiga in am    Counseling time on subjects discussed in assessment and plan sections is over 50% of today's 25 minute visit     Elayne Snare 05/12/2019, 9:07 PM   Note: This office note was prepared with Dragon voice rec ognition system technology. Any transcriptional errors that result from this process are unintentional.

## 2019-05-12 NOTE — Patient Instructions (Addendum)
toujeo 80 units daily  Check blood sugars on waking up days a week  Also check blood sugars about 2 hours after meals and do this after different meals by rotation  Recommended blood sugar levels on waking up are 90-130 and about 2 hours after meal is 130-160  Please bring your blood sugar monitor to each visit, thank you  Humalog take 16-18 units if eating   Farxiga in am

## 2019-05-13 ENCOUNTER — Other Ambulatory Visit: Payer: Self-pay | Admitting: Endocrinology

## 2019-05-13 ENCOUNTER — Telehealth: Payer: Self-pay

## 2019-05-13 NOTE — Telephone Encounter (Signed)
Called pt and left detailed voicemail with MD message. 

## 2019-05-13 NOTE — Telephone Encounter (Signed)
Pt called back returning your call and states she does not see a vm as of yet. Thank you!

## 2019-05-13 NOTE — Telephone Encounter (Signed)
She will have to continue Toujeo

## 2019-05-13 NOTE — Telephone Encounter (Signed)
Received fax from [employer] stating that the PA for Pamela Shannon has been denied, even after citing failure of Toujeo. The reason for denial is because Pamela Shannon is a plan exclusion. Even after PA and appeal, it will not be covered. Please advise.

## 2019-05-13 NOTE — Telephone Encounter (Signed)
PA initiated via [email or web address] for Tresiba U200, injecting 80 units SQ QD. Durwin Glaze (Key: [id number]) Rx #: [id number] Tyler Aas FlexTouch (insulin degludec injection) 200 Units/mL solution   Form [employer] Electronic Prior Authorization Form (2017 NCPDP) Created 4 hours ago Sent to Plan 3 minutes ago Plan Response 3 minutes ago Submit Clinical Questions 2 minutes ago Determination Wait for Determination Please wait for [employer] 2017 NCPDP to return a determination.

## 2019-05-28 ENCOUNTER — Telehealth: Payer: Self-pay | Admitting: Endocrinology

## 2019-05-28 NOTE — Telephone Encounter (Signed)
Patient is requesting to speak with Olen Cordial in regards to her FMLA paperwork.  Please Advise, Thanks

## 2019-05-29 NOTE — Telephone Encounter (Signed)
Pt returned phone call and stated that her FMLA paperwork was denied because it did not stated that there may be some days that the pt has to be out r/t her blood sugar. 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 Pt stated that she needs a new letter stating that there will be some days that require her to be off of work r/t to her diabetes and blood sugar issues.

## 2019-05-29 NOTE — Telephone Encounter (Signed)
Left message for pt to return call back.

## 2019-05-29 NOTE — Telephone Encounter (Signed)
She has not told me about any GI issues and I do not think this should be related to her blood sugars.  She should preferably get a letter from her PCP

## 2019-05-29 NOTE — Telephone Encounter (Signed)
Called pt and gave her MD message. Pt verbalized understanding and stated that she currently does not have a PCP. She states that she has been meaning to make an appt with one, she just has not done it yet, due to the pandemic.

## 2019-06-06 ENCOUNTER — Ambulatory Visit (INDEPENDENT_AMBULATORY_CARE_PROVIDER_SITE_OTHER): Payer: Commercial Managed Care - PPO | Admitting: Family Medicine

## 2019-06-06 ENCOUNTER — Encounter: Payer: Self-pay | Admitting: Family Medicine

## 2019-06-06 ENCOUNTER — Other Ambulatory Visit: Payer: Self-pay

## 2019-06-06 VITALS — BP 130/76 | HR 91 | Temp 98.0°F | Ht 68.75 in | Wt 254.6 lb

## 2019-06-06 DIAGNOSIS — Z5320 Procedure and treatment not carried out because of patient's decision for unspecified reasons: Secondary | ICD-10-CM | POA: Diagnosis not present

## 2019-06-06 DIAGNOSIS — Z1211 Encounter for screening for malignant neoplasm of colon: Secondary | ICD-10-CM

## 2019-06-06 DIAGNOSIS — E1165 Type 2 diabetes mellitus with hyperglycemia: Secondary | ICD-10-CM

## 2019-06-06 DIAGNOSIS — Z2821 Immunization not carried out because of patient refusal: Secondary | ICD-10-CM

## 2019-06-06 DIAGNOSIS — Z7689 Persons encountering health services in other specified circumstances: Secondary | ICD-10-CM

## 2019-06-06 NOTE — Patient Instructions (Addendum)
Health Maintenance Due  Topic Date Due  . PNEUMOCOCCAL POLYSACCHARIDE VACCINE AGE 53-64 HIGH RISK  11/20/1967  . HIV Screening  11/19/1980  . TETANUS/TDAP  11/19/1984  . PAP SMEAR-Modifier  07/06/2014  . MAMMOGRAM  11/20/2015  . COLONOSCOPY  11/20/2015  . OPHTHALMOLOGY EXAM  04/17/2017  Will discuss during visit.   Depression screen [facility] 2/9 02/09/2017 11/19/2014  Decreased Interest 0 0  Down, Depressed, Hopeless 0 0  PHQ - 2 Score 0 0

## 2019-06-06 NOTE — Progress Notes (Signed)
Pamela Shannon is a 53 y.o. female  Chief Complaint  Patient presents with  . Establish Care  . Diabetes    HPI: Pamela Shannon is a 53 y.o. female here to establish care with our office. 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  She has a h/o uncontrolled DM and follows with endocrinology Dr. Dwyane Dee. Her last A1C was 10.1 in 04/2019 and her last OV with Dr. Dwyane Dee was at that time. Her next f/u appt is in 2 wks.  Pt states she previously had FMLA from Dr. Dwyane Dee but she states the most recent form did not state she could miss work for fatigue, diarrhea due to her metformin, high blood sugars. She says the previous form he completed allowed for this.  Pt states she has missed "a few days" in the past 3 mo.   Lab Results  Component Value Date   HGBA1C 10.1 (H) 05/05/2019    Last labs - 09/2018 Last PAP: overdue - no h/o abnormal PAP Mirena placed in 2010 d/t heavy bleeding - would like this removed and replaced Last mammo: pt has never had a mammo - pt declines. Maternal aunt with breast cancer. Last colonoscopy: declines but is agreeable to Cologuard  Pt has had her flu vaccine but has not had PCV23. She declines today.   Past Medical History:  Diagnosis Date  . Back pain   . Diabetes mellitus     History reviewed. No pertinent surgical history.  Social History   Socioeconomic History  . Marital status: Single    Spouse name: Not on file  . Number of children: 3  . Years of education: 61  . Highest education level: Not on file  Occupational History  . Occupation: Diplomatic Services operational officer  . Financial resource strain: Not on file  . Food insecurity    Worry: Not on file    Inability: Not on file  . Transportation needs    Medical: Not on file    Non-medical: Not on file  Tobacco Use  . Smoking status: Former Research scientist (life sciences)  . Smokeless tobacco: Never Used  Substance and Sexual Activity  . Alcohol use: No  . Drug use: No  . Sexual activity: Yes    Birth control/protection: I.U.D.   Lifestyle  . Physical activity    Days per week: Not on file    Minutes per session: Not on file  . Stress: Not on file  Relationships  . Social Herbalist on phone: Not on file    Gets together: Not on file    Attends religious service: Not on file    Active member of club or organization: Not on file    Attends meetings of clubs or organizations: Not on file    Relationship status: Not on file  . Intimate partner violence    Fear of current or ex partner: Not on file    Emotionally abused: Not on file    Physically abused: Not on file    Forced sexual activity: Not on file  Other Topics Concern  . Not on file  Social History Narrative   Fun/Hobby: Go to church.    Denies abuse and feels safe at home.     Family History  Problem Relation Age of Onset  . Diabetes Father   . Diabetes Mother   . Fibromyalgia Mother   . Heart disease Maternal Grandmother   . Gout Maternal Grandmother   . Heart attack Maternal Grandfather  Immunization History  Administered Date(s) Administered  . Influenza,inj,Quad PF,6+ Mos 07/12/2015, 05/24/2016, 06/15/2017, 05/12/2019    Outpatient Encounter Medications as of 06/06/2019  Medication Sig  . Ascorbic Acid (VITAMIN C) 1000 MG tablet Take 1,000 mg by mouth daily.  . Blood Glucose Monitoring Suppl (ONE TOUCH ULTRA 2) W/DEVICE KIT by Does not apply route.  [name] buPROPion (WELLBUTRIN XL) 150 MG 24 hr tablet Take 1 tablet by mouth twice daily  . dapagliflozin propanediol (FARXIGA) 10 MG TABS tablet Take 10 mg by mouth daily before breakfast.  . gabapentin (NEURONTIN) 300 MG capsule Take 1 capsule (300 mg total) by mouth at bedtime. (Patient taking differently: Take 300 mg by mouth at bedtime as needed. )  . HUMALOG KWIKPEN 100 UNIT/ML KwikPen INJECT 10 UNITS SUBCUTANEOUSLY BEFORE MEAL(S)  . levonorgestrel (MIRENA) 20 MCG/24HR IUD 1 each by Intrauterine route once.    . metFORMIN (GLUCOPHAGE-XR) 500 MG 24 hr tablet TAKE 4 TABLETS BY  MOUTH ONCE DAILY WITH SUPPER  . Multiple Vitamins-Minerals (MULTIVITAMIN ADULT PO) Take by mouth.  . TOUJEO MAX SOLOSTAR 300 UNIT/ML SOPN INJECT 50 UNITS SUBCUTANEOUSLY ONCE DAILY  . [DISCONTINUED] BYDUREON BCISE 2 MG/0.85ML AUIJ INJECT 2MG INTO THE SKIN ONCE A WEEK  . [DISCONTINUED] Insulin Degludec (TRESIBA FLEXTOUCH) 200 UNIT/ML SOPN Inject 80 Units into the skin daily.   No facility-administered encounter medications on file as of 06/06/2019.      ROS: Gen: no fever, chills; + fatigue Skin: no rash, itching ENT: no ear pain, ear drainage, nasal congestion, rhinorrhea, sinus pressure, sore throat Eyes: no blurry vision, double vision Resp: no cough, wheeze,SOB CV: no CP, palpitations, LE edema,  GI: no heartburn, n/v, abd pain; + occasional diarrhea GU: no dysuria, urgency, frequency, hematuria  MSK: no joint pain, myalgias, + back pain - intermittent, chronic Neuro: no dizziness, headache, weakness Psych: no depression, anxiety, insomnia   No Known Allergies  BP 130/76 (BP Location: Left Arm, Patient Position: Sitting, Cuff Size: Normal)   Pulse 91   Temp 98 F (36.7 C) (Oral)   Ht 5' 8.75" (1.746 m)   Wt 254 lb 9.6 oz (115.5 kg)   SpO2 96%   BMI 37.87 kg/m   Physical Exam  Constitutional: She is oriented to person, place, and time. She appears well-developed and well-nourished. No distress.  Neck: Neck supple.  Cardiovascular: Normal rate, regular rhythm and intact distal pulses.  Pulmonary/Chest: Effort normal and breath sounds normal. No respiratory distress.  Lymphadenopathy:    She has no cervical adenopathy.  Neurological: She is alert and oriented to person, place, and time.  Psychiatric: She has a normal mood and affect. Her behavior is normal.     A/P:  1. Encounter to establish care with new doctor  2. Screening mammography declined - maternal aunt with breast cancer but pt declines mammogram  3. Pneumococcal vaccination declined by patient  4.  Screening for colon cancer - declines colonoscopy but is agreeable to Cologuard  5. Uncontrolled type 2 diabetes mellitus with hyperglycemia ([place]) - follows with Dr. Dwyane Dee, next appt in about 2 wks, last A1C = 10.1 - cont regular f/u with endo - pt is requesting I complete FMLA forms to allow her to miss work when she feels too fatigued due to heat or hyperglycemia, or experiences diarrhea secondary to metformin. I explained to pt that I would agree to complete forms so she could miss work to attend regular f/u appts, I do not feel comfortable doing more than that. I  explained the priority should be to get her DM under better control so she is less symptomatic and therefore would not need to miss work. Pt requests I speak with Dr. Dwyane Dee to better understand her diabetes, symptoms, etc. I agreed to reach out to him and get back in touch with pt next week

## 2019-06-16 ENCOUNTER — Telehealth: Payer: Self-pay

## 2019-06-16 NOTE — Telephone Encounter (Signed)
PA initiated via CoverMyMeds.com for Farxiga 10mg  tablets, taking once daily.  Benjaman Kindler (Key: [employer]) Wilder Glade 10MG  tablets   Form [employer] Electronic Prior Authorization Form (2017 NCPDP) Created 8 minutes ago Sent to Plan 7 minutes ago Plan Response 6 minutes ago Submit Clinical Questions less than a minute ago Determination Wait for Determination Please wait for [employer] 2017 NCPDP to return a determination.

## 2019-06-19 ENCOUNTER — Other Ambulatory Visit: Payer: Commercial Managed Care - PPO

## 2019-06-20 ENCOUNTER — Other Ambulatory Visit (INDEPENDENT_AMBULATORY_CARE_PROVIDER_SITE_OTHER): Payer: Commercial Managed Care - PPO

## 2019-06-20 ENCOUNTER — Other Ambulatory Visit: Payer: Self-pay

## 2019-06-20 ENCOUNTER — Telehealth: Payer: Self-pay

## 2019-06-20 DIAGNOSIS — E1165 Type 2 diabetes mellitus with hyperglycemia: Secondary | ICD-10-CM

## 2019-06-20 DIAGNOSIS — Z794 Long term (current) use of insulin: Secondary | ICD-10-CM | POA: Diagnosis not present

## 2019-06-20 LAB — COMPREHENSIVE METABOLIC PANEL
ALT: 50 U/L — ABNORMAL HIGH (ref 0–35)
AST: 74 U/L — ABNORMAL HIGH (ref 0–37)
Albumin: 3.8 g/dL (ref 3.5–5.2)
Alkaline Phosphatase: 64 U/L (ref 39–117)
BUN: 8 mg/dL (ref 6–23)
CO2: 31 mEq/L (ref 19–32)
Calcium: 9.2 mg/dL (ref 8.4–10.5)
Chloride: 101 mEq/L (ref 96–112)
Creatinine, Ser: 0.58 mg/dL (ref 0.40–1.20)
GFR: 108.51 mL/min (ref >60.00)
Glucose, Bld: 213 mg/dL — ABNORMAL HIGH (ref 70–99)
Potassium: 4.1 mEq/L (ref 3.5–5.1)
Sodium: 139 mEq/L (ref 135–145)
Total Bilirubin: 0.7 mg/dL (ref 0.2–1.2)
Total Protein: 7 g/dL (ref 6.0–8.3)

## 2019-06-20 NOTE — Telephone Encounter (Signed)
Copied from [place] [phone]. Topic: General - Other >> Jun 20, 2019  4:22 PM Yvette Rack wrote: Reason for CRM: Pt stated she had an appt a few weeks ago with Dr. Loletha Grayer and she was suppose to receive a call regarding a paper for another provider but she has not heard from anyone. Pt requests call back. Cb# [phone]

## 2019-06-20 NOTE — Telephone Encounter (Signed)
I reached out to Dr. Dwyane Dee on the day of pts OV with me 10/16 and again today. I have not yet gotten a response. I will try again next week when I'm in the office

## 2019-06-21 LAB — FRUCTOSAMINE: Fructosamine: 281 umol/L (ref 0–285)

## 2019-06-22 ENCOUNTER — Encounter: Payer: Self-pay | Admitting: Endocrinology

## 2019-06-23 ENCOUNTER — Ambulatory Visit (INDEPENDENT_AMBULATORY_CARE_PROVIDER_SITE_OTHER): Payer: Commercial Managed Care - PPO | Admitting: Endocrinology

## 2019-06-23 ENCOUNTER — Other Ambulatory Visit: Payer: Self-pay

## 2019-06-23 ENCOUNTER — Encounter: Payer: Self-pay | Admitting: Endocrinology

## 2019-06-23 VITALS — BP 132/70 | HR 68 | Ht 68.75 in | Wt 254.6 lb

## 2019-06-23 DIAGNOSIS — E1165 Type 2 diabetes mellitus with hyperglycemia: Secondary | ICD-10-CM

## 2019-06-23 DIAGNOSIS — R945 Abnormal results of liver function studies: Secondary | ICD-10-CM | POA: Diagnosis not present

## 2019-06-23 DIAGNOSIS — Z794 Long term (current) use of insulin: Secondary | ICD-10-CM

## 2019-06-23 DIAGNOSIS — E6609 Other obesity due to excess calories: Secondary | ICD-10-CM

## 2019-06-23 MED ORDER — JARDIANCE 25 MG PO TABS: 25.0000 mg | ORAL_TABLET | Freq: Every day | ORAL | 2 refills | Status: DC

## 2019-06-23 NOTE — Telephone Encounter (Signed)
Left patient a vm

## 2019-06-23 NOTE — Progress Notes (Signed)
Patient ID: Pamela Shannon, female   DOB: February 02, 1966, 53 y.o.   MRN: [id number]           Reason for Appointment: Endocrinology follow-up   History of Present Illness:          Diagnosis: Type 2 diabetes mellitus, date of diagnosis: ?  2011       Past history:  At diagnosis she was having symptoms of marked fatigue, dry mouth but not clear how high her sugars were. She has been on various medications for treating her diabetes, probably metformin to start with. Also around the time of diagnosis she thinks she weighed about 300 pounds. She thinks she was able to lose weight subsequently and about 2 years ago had lost down to nearly 200 pounds All the details are not available of her previous management but she appears to have had progressively worsening requiring multiple agents and additional therapies. She thinks her A1c has been over 9% for about 2 years or more, lab records are not available She probably had been on metformin along with Tanzeum about a year or so ago. Because of inadequate control she was also given Invokamet at some point.  In 9/15 she was switched from Tanzeum to [place] and started on bedtime Levemir insulin, 20 units  She has been tried on Ozempic and Trulicity, both stopped because of cost  Recent history:  INSULIN regimen: Toujeo 100 units, Humalog 10-12 acl 12-16, units before largest meal  Non- hypoglycemic drugs the patient is taking are: Metformin 1 g twice daily  Her A1c is last high at 10.1 compared to 10.8 previously   Current management, blood sugar patterns and problems identified:   She was given a free trial of Farxiga 10 mg daily after her visit in 04/2019  She said that with this her blood sugars started improving progressively and were down to as low as 124 fasting  Subsequently she did not get approval through her insurance for this and is not able to get this  She is primarily checking fasting blood sugars and did not bring her meter   Most of her blood sugars are over 200  This is despite her progressively increasing her Toujeo and going up a total of 40 units since this summer  She also thinks her blood sugars after meals are at least over 200 fairly consistently  Despite this she has taken only 10 to 16 units of Humalog at meals, previously was taking as much as 22 at suppertime  She says she cannot exercise because she did not find the time to do so  Overall however she is trying to cut back on high fat and high carbohydrate food  Despite this and also trial of Farxiga her weight is not any better  Average blood sugar is not available      Side effects from medications have been: None Compliance with the medical regimen: Variable  Glucose monitoring:  done ?  1.   times a day         Glucometer: One Touch ultra 2      Blood Glucose readings  As above Lab fasting glucose 213    Self-care: The diet that the patient has been following is: Variable    Meals: 3 meals per day, evening meal at 5 PM. Breakfast is protein shake, toast or oatmeal, sometimes cereal.    Has snacks with peanut butter crackers    Exercise: A little walking at work  Dietician visit, most recent: 11/19/14                Weight history: Previous range 200-260  Wt Readings from Last 3 Encounters:  06/23/19 254 lb 9.6 oz (115.5 kg)  06/06/19 254 lb 9.6 oz (115.5 kg)  05/12/19 254 lb (115.2 kg)      Lab Results  Component Value Date   HGBA1C 10.1 (H) 05/05/2019   HGBA1C 10.8 (H) 01/27/2019   HGBA1C 8.8 (H) 10/10/2018   Lab Results  Component Value Date   MICROALBUR 16.2 (H) 10/10/2018   LDLCALC 89 01/31/2016   CREATININE 0.58 06/20/2019    Lab on 06/20/2019  Component Date Value Ref Range Status  . Sodium 06/20/2019 139  135 - 145 mEq/L Final  . Potassium 06/20/2019 4.1  3.5 - 5.1 mEq/L Final  . Chloride 06/20/2019 101  96 - 112 mEq/L Final  . CO2 06/20/2019 31  19 - 32 mEq/L Final  . Glucose, Bld 06/20/2019  213* 70 - 99 mg/dL Final  . BUN 06/20/2019 8  6 - 23 mg/dL Final  . Creatinine, Ser 06/20/2019 0.58  0.40 - 1.20 mg/dL Final  . Total Bilirubin 06/20/2019 0.7  0.2 - 1.2 mg/dL Final  . Alkaline Phosphatase 06/20/2019 64  39 - 117 U/L Final  . AST 06/20/2019 74* 0 - 37 U/L Final  . ALT 06/20/2019 50* 0 - 35 U/L Final  . Total Protein 06/20/2019 7.0  6.0 - 8.3 g/dL Final  . Albumin 06/20/2019 3.8  3.5 - 5.2 g/dL Final  . Calcium 06/20/2019 9.2  8.4 - 10.5 mg/dL Final  . GFR 06/20/2019 108.51  >60.00 mL/min Final  . Fructosamine 06/20/2019 281  0 - 285 umol/L Final   Comment: Published reference interval for apparently healthy subjects between age 4 and 19 is 17 - 285 umol/L and in a poorly controlled diabetic population is 228 - 563 umol/L with a mean of 396 umol/L.       Allergies as of 06/23/2019   No Known Allergies     Medication List       Accurate as of June 23, 2019  4:24 PM. If you have any questions, ask your nurse or doctor.        buPROPion 150 MG 24 hr tablet Commonly known as: WELLBUTRIN XL Take 1 tablet by mouth twice daily   Farxiga 10 MG Tabs tablet Generic drug: dapagliflozin propanediol Take 10 mg by mouth daily before breakfast.   gabapentin 300 MG capsule Commonly known as: NEURONTIN Take 1 capsule (300 mg total) by mouth at bedtime. What changed:   when to take this  reasons to take this   HumaLOG KwikPen 100 UNIT/ML KwikPen Generic drug: insulin lispro INJECT 10 UNITS SUBCUTANEOUSLY BEFORE MEAL(S)   levonorgestrel 20 MCG/24HR IUD Commonly known as: MIRENA 1 each by Intrauterine route once.   metFORMIN 500 MG 24 hr tablet Commonly known as: GLUCOPHAGE-XR TAKE 4 TABLETS BY MOUTH ONCE DAILY WITH SUPPER   MULTIVITAMIN ADULT PO Take by mouth.   ONE TOUCH ULTRA 2 w/Device Kit by Does not apply route.   Toujeo Max SoloStar 300 UNIT/ML Sopn Generic drug: Insulin Glargine (2 Unit Dial) INJECT 50 UNITS SUBCUTANEOUSLY ONCE DAILY    vitamin C 1000 MG tablet Take 1,000 mg by mouth daily.       Allergies: No Known Allergies  Past Medical History:  Diagnosis Date  . Back pain   . Diabetes mellitus     History reviewed.  No pertinent surgical history.  Family History  Problem Relation Age of Onset  . Diabetes Father   . Diabetes Mother   . Fibromyalgia Mother   . Heart disease Maternal Grandmother   . Gout Maternal Grandmother   . Heart attack Maternal Grandfather     Social History:  reports that she has quit smoking. She has never used smokeless tobacco. She reports that she does not drink alcohol or use drugs.    Review of Systems    She on Wellbutrin 300 mg SR prescribed for improving her appetite and mood         Lipids: LDL below 100, not on statin treatment, triglycerides are usually high      Lab Results  Component Value Date   CHOL 155 10/10/2018   HDL 36.70 (L) 10/10/2018   LDLCALC 89 01/31/2016   LDLDIRECT 97.0 10/10/2018   TRIG 248.0 (H) 10/10/2018   CHOLHDL 4 10/10/2018               Has persistently abnormal functions She says that she is taking an OTC supplement for fatty liver, does not know the name  Lab Results  Component Value Date   ALT 50 (H) 06/20/2019   ALT 40 (H) 01/27/2019   ALT 40 (H) 10/10/2018        She has periodic numbness and burning in feet.   Last foot exam 10/2018  She also takes gabapentin as needed for restless legs at night  Last eye exam 2017, reportedly had background retinopathy Has not followed up on this and probably cannot afford the co-pay   Possible hypertension: Her blood pressure appears to be high normal although she was rushing and stressed today  BP Readings from Last 3 Encounters:  06/23/19 132/70  06/06/19 130/76  05/12/19 (!) 144/80     Physical Examination:  BP 132/70 (BP Location: Left Arm, Patient Position: Sitting, Cuff Size: Large)   Pulse 68   Ht 5' 8.75" (1.746 m)   Wt 254 lb 9.6 oz (115.5 kg)   SpO2 97%    BMI 37.87 kg/m    ASSESSMENT/PLAN:  Diabetes type 2, insulin requiring with obesity  See history of present illness for detailed discussion of current diabetes management, blood sugar patterns and problems identified  Her A1c is last 10.1  Blood sugars are still poorly controlled  Although she did try taking Farxiga 10 mg daily and improved her blood sugars as well as subjectively felt better she is not able to get this approved through her insurance Also unable to get [place] approved through her insurance  However she has continued to increase her Toujeo With this her blood sugars are still not improving and mostly over 200 in the morning However not clear if her sugars are significantly higher at bedtime since she does not monitor days and also did not bring the monitor for download   Plan:   Toujeo continue at 100 units daily  She needs to check her blood sugars after meals at least once a day  Blood sugar target at least under 180 after meals  She will go to at least 20 units of Humalog at meals and may go up to 25 if postprandial readings are still not better  Will continue trying prior authorization for Farxiga  Needs to bring monitor for download on each visit  Try only 1500 mg of Metformin since she thinks she gets diarrhea sometimes with 2000 dosage  Encouraged her to start walking  daily at whatever time she can do this   Abnormal liver functions: Probably from fatty liver but needs to follow-up with PCP She wants to try an OTC herbal product and she can try milk thistle empirically  Annual eye exams  FMLA letter given for patient, she says that she cannot work if she has diarrhea in the morning when her blood sugars are high or from metformin    There are no Patient Instructions on file for this visit.   Counseling time on subjects discussed in assessment and plan sections is over 50% of today's 25 minute visit     Elayne Snare 06/23/2019, 4:24 PM    Note: This office note was prepared with Dragon voice rec ognition system technology. Any transcriptional errors that result from this process are unintentional.

## 2019-06-23 NOTE — Patient Instructions (Addendum)
Check blood sugars on waking up 7 days a week  Also check blood sugars about 2 hours after meals and do this after different meals by rotation  Recommended blood sugar levels on waking up are 90-130 and about 2 hours after meal is 130-160  Please bring your blood sugar monitor to each visit, thank you  Take 22 Humalog at meals  Call if Farxiga covered  Metformin 3 daily  Milk thistle for liver

## 2019-06-23 NOTE — Telephone Encounter (Signed)
PA renewed  [facility]: [id number] - PA Case ID: [id number] Need help? Call us at [phone] Status Sent to [place] 10MG  OR TABS Form [employer] Electronic Prior Authorization Form [phone] NCPDP)

## 2019-06-24 ENCOUNTER — Encounter: Payer: Self-pay | Admitting: Endocrinology

## 2019-06-24 MED ORDER — TOUJEO MAX SOLOSTAR 300 UNIT/ML ~~LOC~~ SOPN: 100.0000 [IU] | PEN_INJECTOR | Freq: Every day | SUBCUTANEOUS | 1 refills | Status: DC

## 2019-06-24 NOTE — Addendum Note (Signed)
Addended by: Elayne Snare on: 06/24/2019 02:13 PM   Modules accepted: Orders

## 2019-07-01 ENCOUNTER — Other Ambulatory Visit: Payer: Self-pay | Admitting: Endocrinology

## 2019-07-19 ENCOUNTER — Other Ambulatory Visit: Payer: Self-pay | Admitting: Endocrinology

## 2019-08-01 ENCOUNTER — Other Ambulatory Visit: Payer: Self-pay

## 2019-08-01 ENCOUNTER — Other Ambulatory Visit (INDEPENDENT_AMBULATORY_CARE_PROVIDER_SITE_OTHER): Payer: Commercial Managed Care - PPO

## 2019-08-01 DIAGNOSIS — Z794 Long term (current) use of insulin: Secondary | ICD-10-CM | POA: Diagnosis not present

## 2019-08-01 DIAGNOSIS — E1165 Type 2 diabetes mellitus with hyperglycemia: Secondary | ICD-10-CM | POA: Diagnosis not present

## 2019-08-01 LAB — COMPREHENSIVE METABOLIC PANEL
ALT: 54 U/L — ABNORMAL HIGH (ref 0–35)
AST: 69 U/L — ABNORMAL HIGH (ref 0–37)
Albumin: 4.1 g/dL (ref 3.5–5.2)
Alkaline Phosphatase: 66 U/L (ref 39–117)
BUN: 14 mg/dL (ref 6–23)
CO2: 27 mEq/L (ref 19–32)
Calcium: 9.4 mg/dL (ref 8.4–10.5)
Chloride: 101 mEq/L (ref 96–112)
Creatinine, Ser: 0.63 mg/dL (ref 0.40–1.20)
GFR: 98.59 mL/min (ref >60.00)
Glucose, Bld: 198 mg/dL — ABNORMAL HIGH (ref 70–99)
Potassium: 4 mEq/L (ref 3.5–5.1)
Sodium: 136 mEq/L (ref 135–145)
Total Bilirubin: 0.7 mg/dL (ref 0.2–1.2)
Total Protein: 7.7 g/dL (ref 6.0–8.3)

## 2019-08-01 LAB — HEMOGLOBIN A1C: Hgb A1c MFr Bld: 8.3 % — ABNORMAL HIGH (ref 4.6–6.5)

## 2019-08-04 ENCOUNTER — Telehealth: Payer: Self-pay

## 2019-08-04 ENCOUNTER — Other Ambulatory Visit: Payer: Self-pay

## 2019-08-04 ENCOUNTER — Ambulatory Visit (INDEPENDENT_AMBULATORY_CARE_PROVIDER_SITE_OTHER): Payer: Commercial Managed Care - PPO | Admitting: Endocrinology

## 2019-08-04 ENCOUNTER — Encounter: Payer: Self-pay | Admitting: Endocrinology

## 2019-08-04 DIAGNOSIS — E1165 Type 2 diabetes mellitus with hyperglycemia: Secondary | ICD-10-CM

## 2019-08-04 DIAGNOSIS — Z794 Long term (current) use of insulin: Secondary | ICD-10-CM | POA: Diagnosis not present

## 2019-08-04 DIAGNOSIS — E6609 Other obesity due to excess calories: Secondary | ICD-10-CM

## 2019-08-04 DIAGNOSIS — R945 Abnormal results of liver function studies: Secondary | ICD-10-CM

## 2019-08-04 MED ORDER — TRULICITY 1.5 MG/0.5ML ~~LOC~~ SOAJ: SUBCUTANEOUS | 0 refills | Status: DC

## 2019-08-04 NOTE — Telephone Encounter (Signed)
Made in error

## 2019-08-04 NOTE — Progress Notes (Signed)
Patient ID: Pamela Shannon, female   DOB: 10-26-1965, 53 y.o.   MRN: [id number]           Reason for Appointment: Endocrinology follow-up  I connected with the above-named patient by video enabled telemedicine application and verified that I am speaking with the correct person. The patient was explained the limitations of evaluation and management by telemedicine and the availability of in person appointments.  Patient also understood that there may be a patient responsible charge related to this service . Location of the patient: Patient's home . Location of the provider: Physician office Only the patient and myself were participating in the encounter The patient understood the above statements and agreed to proceed.   History of Present Illness:          Diagnosis: Type 2 diabetes mellitus, date of diagnosis: ?  2011       Past history:  At diagnosis she was having symptoms of marked fatigue, dry mouth but not clear how high her sugars were. She has been on various medications for treating her diabetes, probably metformin to start with. Also around the time of diagnosis she thinks she weighed about 300 pounds. She thinks she was able to lose weight subsequently and about 2 years ago had lost down to nearly 200 pounds All the details are not available of her previous management but she appears to have had progressively worsening requiring multiple agents and additional therapies. She thinks her A1c has been over 9% for about 2 years or more, lab records are not available She probably had been on metformin along with Tanzeum about a year or so ago. Because of inadequate control she was also given Invokamet at some point.  In 9/15 she was switched from Tanzeum to [place] and started on bedtime Levemir insulin, 20 units  She has been tried on Ozempic and Trulicity, both stopped because of cost  Recent history:  INSULIN regimen: Toujeo 92 units, Humalog 12 units acl and 22, units before  dinner  Non- hypoglycemic drugs the patient is taking are: Metformin 500 mg a.m., 1000 mg p.m., Farxiga 10 mg daily  Her A1c is relatively better at 8.3, previously was high at 10.1   Current management, blood sugar patterns and problems identified:   She was able to start [place] since her last visit and she cannot afford this now  However she is complaining of excessive hunger and tending to eat more meals and snacks  She thinks that because of her work schedule she cannot do any exercise  Although she does have overall better readings she is checking them primarily in the morning and these are quite variable  She has had 2 readings before dinnertime and one of them was high  She thinks that most of her readings at bedtime are likely over 200 and also her blood sugars are higher since Thanksgiving  No weight loss with starting [place] and she may have gained weight  Average blood sugar is not available      Side effects from medications have been: None Compliance with the medical regimen: Variable  Glucose monitoring:  done ?  1.   times a day         Glucometer: One Touch ultra 2      Blood Glucose readings   PRE-MEAL Fasting Lunch Dinner Bedtime Overall  Glucose range: 150-224   134, 211  208   Mean/median:         Lab fasting glucose 198  Self-care: The diet that the patient has been following is: Variable    Meals: 3 meals per day, evening meal at 5 PM. Breakfast is protein shake, toast or oatmeal, sometimes cereal.    Has snacks with peanut butter crackers            Dietician visit, most recent: 11/19/14                Weight history: Previous range 200-260  Wt Readings from Last 3 Encounters:  06/23/19 254 lb 9.6 oz (115.5 kg)  06/06/19 254 lb 9.6 oz (115.5 kg)  05/12/19 254 lb (115.2 kg)      Lab Results  Component Value Date   HGBA1C 8.3 (H) 08/01/2019   HGBA1C 10.1 (H) 05/05/2019   HGBA1C 10.8 (H) 01/27/2019   Lab Results  Component Value  Date   MICROALBUR 16.2 (H) 10/10/2018   LDLCALC 89 01/31/2016   CREATININE 0.63 08/01/2019    Lab on 08/01/2019  Component Date Value Ref Range Status  . Sodium 08/01/2019 136  135 - 145 mEq/L Final  . Potassium 08/01/2019 4.0  3.5 - 5.1 mEq/L Final  . Chloride 08/01/2019 101  96 - 112 mEq/L Final  . CO2 08/01/2019 27  19 - 32 mEq/L Final  . Glucose, Bld 08/01/2019 198* 70 - 99 mg/dL Final  . BUN 08/01/2019 14  6 - 23 mg/dL Final  . Creatinine, Ser 08/01/2019 0.63  0.40 - 1.20 mg/dL Final  . Total Bilirubin 08/01/2019 0.7  0.2 - 1.2 mg/dL Final  . Alkaline Phosphatase 08/01/2019 66  39 - 117 U/L Final  . AST 08/01/2019 69* 0 - 37 U/L Final  . ALT 08/01/2019 54* 0 - 35 U/L Final  . Total Protein 08/01/2019 7.7  6.0 - 8.3 g/dL Final  . Albumin 08/01/2019 4.1  3.5 - 5.2 g/dL Final  . GFR 08/01/2019 98.59  >60.00 mL/min Final  . Calcium 08/01/2019 9.4  8.4 - 10.5 mg/dL Final  . Hgb A1c MFr Bld 08/01/2019 8.3* 4.6 - 6.5 % Final   Glycemic Control Guidelines for People with Diabetes:Non Diabetic:  <6%Goal of Therapy: <7%Additional Action Suggested:  >8%       Allergies as of 08/04/2019   No Known Allergies     Medication List       Accurate as of August 04, 2019  4:10 PM. If you have any questions, ask your nurse or doctor.        buPROPion 150 MG 24 hr tablet Commonly known as: WELLBUTRIN XL Take 1 tablet by mouth twice daily   Farxiga 10 MG Tabs tablet Generic drug: dapagliflozin propanediol Take 10 mg by mouth daily before breakfast.   gabapentin 300 MG capsule Commonly known as: NEURONTIN Take 1 capsule (300 mg total) by mouth at bedtime. What changed:   when to take this  reasons to take this   HumaLOG KwikPen 100 UNIT/ML KwikPen Generic drug: insulin lispro Inject 10-12 units under the skin at lunch and 12-16 before dinner. What changed: additional instructions   [place] 25 MG Tabs tablet Generic drug: empagliflozin Take 25 mg by mouth daily before  breakfast.   levonorgestrel 20 MCG/24HR IUD Commonly known as: MIRENA 1 each by Intrauterine route once.   metFORMIN 500 MG 24 hr tablet Commonly known as: GLUCOPHAGE-XR TAKE 4 TABLETS BY MOUTH ONCE DAILY WITH SUPPER What changed: See the new instructions.   MULTIVITAMIN ADULT PO Take by mouth.   ONE TOUCH ULTRA 2 w/Device Kit by  Does not apply route.   Toujeo Max SoloStar 300 UNIT/ML Sopn Generic drug: Insulin Glargine (2 Unit Dial) Inject 100 Units into the skin daily. Inject 100 units under the skin once daily. What changed:   how much to take  additional instructions   vitamin C 1000 MG tablet Take 1,000 mg by mouth daily.       Allergies: No Known Allergies  Past Medical History:  Diagnosis Date  . Back pain   . Diabetes mellitus     History reviewed. No pertinent surgical history.  Family History  Problem Relation Age of Onset  . Diabetes Father   . Diabetes Mother   . Fibromyalgia Mother   . Heart disease Maternal Grandmother   . Gout Maternal Grandmother   . Heart attack Maternal Grandfather     Social History:  reports that she has quit smoking. She has never used smokeless tobacco. She reports that she does not drink alcohol or use drugs.    Review of Systems    She on Wellbutrin 300 mg SR prescribed for improving her appetite and mood         Lipids: LDL below 100, not on statin treatment, triglycerides are usually high      Lab Results  Component Value Date   CHOL 155 10/10/2018   HDL 36.70 (L) 10/10/2018   LDLCALC 89 01/31/2016   LDLDIRECT 97.0 10/10/2018   TRIG 248.0 (H) 10/10/2018   CHOLHDL 4 10/10/2018               Has persistently abnormal functions She says that she is taking an OTC supplement for fatty liver, does not know the name  Lab Results  Component Value Date   ALT 54 (H) 08/01/2019   ALT 50 (H) 06/20/2019   ALT 40 (H) 01/27/2019        She has periodic numbness and burning in feet.   Last foot exam  10/2018  She also takes gabapentin as needed for restless legs at night  Last eye exam 2017, reportedly had background retinopathy Has not followed up on this and probably cannot afford the co-pay   Possible hypertension: Her blood pressure appears to be high normal although she was rushing and stressed today  BP Readings from Last 3 Encounters:  06/23/19 132/70  06/06/19 130/76  05/12/19 (!) 144/80     Physical Examination:  There were no vitals taken for this visit.   ASSESSMENT/PLAN:  Diabetes type 2, insulin requiring with obesity  See history of present illness for detailed discussion of current diabetes management, blood sugar patterns and problems identified  Her A1c is better at 8.3  She may be benefiting from [place] However her main difficulty is watching her diet She thinks that she can do better with a medication to reduce her appetite Already taking Wellbutrin She may be able to benefit from a GLP-1 drug again and not clear which preparation is covered by her insurance Weight loss medication not covered by her insurance   Plan:   Toujeo to be increased to 100 units daily, discussed need for better fasting blood sugars with target and 130  She needs to check her blood sugars 2 hours after lunch or dinner especially after dinner on a regular basis  She will need to adjust her Humalog further based on her readings after dinner  However if she is eating only small meals such as soups in the evening she can reduce her dose down to 15 units  Encourage her to avoid any fried food, high carbohydrate meals  She is eating a larger meal at lunch she will need to increase the dose to 20 units at least  Continue Farxiga and Metformin  Trial of Trulicity 1.5 mg weekly, she has taken this without side effects previously  She will be mailed a co-pay card for this and it appears to be covered by insurance; may also consider a higher dose if she is continuing  this  May need to increase Toujeo further if fasting readings continue to be high  Encouraged her to start walking daily at whatever time she can do this   Abnormal liver functions: Likely to be from fatty liver and needs weight loss and better diabetes control     There are no Patient Instructions on file for this visit.   Counseling time on subjects discussed in assessment and plan sections is over 50% of today's 25 minute visit     Elayne Snare 08/04/2019, 4:10 PM   Note: This office note was prepared with Dragon voice rec ognition system technology. Any transcriptional errors that result from this process are unintentional.

## 2019-08-10 ENCOUNTER — Other Ambulatory Visit: Payer: Self-pay | Admitting: Endocrinology

## 2019-08-13 ENCOUNTER — Other Ambulatory Visit: Payer: Self-pay

## 2019-08-13 ENCOUNTER — Ambulatory Visit (INDEPENDENT_AMBULATORY_CARE_PROVIDER_SITE_OTHER): Payer: Commercial Managed Care - PPO | Admitting: Medical

## 2019-08-13 ENCOUNTER — Encounter: Payer: Self-pay | Admitting: Medical

## 2019-08-13 VITALS — BP 133/77 | HR 99 | Wt 250.3 lb

## 2019-08-13 DIAGNOSIS — Z3202 Encounter for pregnancy test, result negative: Secondary | ICD-10-CM | POA: Diagnosis not present

## 2019-08-13 DIAGNOSIS — N912 Amenorrhea, unspecified: Secondary | ICD-10-CM

## 2019-08-13 LAB — POCT PREGNANCY, URINE: Preg Test, Ur: NEGATIVE

## 2019-08-13 NOTE — Patient Instructions (Signed)
Menopause Menopause is the normal time of life when menstrual periods stop completely. It is usually confirmed by 12 months without a menstrual period. The transition to menopause (perimenopause) most often happens between the ages of 45 and 55. During perimenopause, hormone levels change in your body, which can cause symptoms and affect your health. Menopause may increase your risk for:  Loss of bone (osteoporosis), which causes bone breaks (fractures).  Depression.  Hardening and narrowing of the arteries (atherosclerosis), which can cause heart attacks and strokes. What are the causes? This condition is usually caused by a natural change in hormone levels that happens as you get older. The condition may also be caused by surgery to remove both ovaries (bilateral oophorectomy). What increases the risk? This condition is more likely to start at an earlier age if you have certain medical conditions or treatments, including:  A tumor of the pituitary gland in the brain.  A disease that affects the ovaries and hormone production.  Radiation treatment for cancer.  Certain cancer treatments, such as chemotherapy or hormone (anti-estrogen) therapy.  Heavy smoking and excessive alcohol use.  Family history of early menopause. This condition is also more likely to develop earlier in women who are very thin. What are the signs or symptoms? Symptoms of this condition include:  Hot flashes.  Irregular menstrual periods.  Night sweats.  Changes in feelings about sex. This could be a decrease in sex drive or an increased comfort around your sexuality.  Vaginal dryness and thinning of the vaginal walls. This may cause painful intercourse.  Dryness of the skin and development of wrinkles.  Headaches.  Problems sleeping (insomnia).  Mood swings or irritability.  Memory problems.  Weight gain.  Hair growth on the face and chest.  Bladder infections or problems with urinating. How  is this diagnosed? This condition is diagnosed based on your medical history, a physical exam, your age, your menstrual history, and your symptoms. Hormone tests may also be done. How is this treated? In some cases, no treatment is needed. You and your health care provider should make a decision together about whether treatment is necessary. Treatment will be based on your individual condition and preferences. Treatment for this condition focuses on managing symptoms. Treatment may include:  Menopausal hormone therapy (MHT).  Medicines to treat specific symptoms or complications.  Acupuncture.  Vitamin or herbal supplements. Before starting treatment, make sure to let your health care provider know if you have a personal or family history of:  Heart disease.  Breast cancer.  Blood clots.  Diabetes.  Osteoporosis. Follow these instructions at home: Lifestyle  Do not use any products that contain nicotine or tobacco, such as cigarettes and e-cigarettes. If you need help quitting, ask your health care provider.  Get at least 30 minutes of physical activity on 5 or more days each week.  Avoid alcoholic and caffeinated beverages, as well as spicy foods. This may help prevent hot flashes.  Get 7-8 hours of sleep each night.  If you have hot flashes, try: ? Dressing in layers. ? Avoiding things that may trigger hot flashes, such as spicy food, warm places, or stress. ? Taking slow, deep breaths when a hot flash starts. ? Keeping a fan in your home and office.  Find ways to manage stress, such as deep breathing, meditation, or journaling.  Consider going to group therapy with other women who are having menopause symptoms. Ask your health care provider about recommended group therapy meetings. Eating and   drinking  Eat a healthy, balanced diet that contains whole grains, lean protein, low-fat dairy, and plenty of fruits and vegetables.  Your health care provider may recommend  adding more soy to your diet. Foods that contain soy include tofu, tempeh, and soy milk.  Eat plenty of foods that contain calcium and vitamin D for bone health. Items that are rich in calcium include low-fat milk, yogurt, beans, almonds, sardines, broccoli, and kale. Medicines  Take over-the-counter and prescription medicines only as told by your health care provider.  Talk with your health care provider before starting any herbal supplements. If prescribed, take vitamins and supplements as told by your health care provider. These may include: ? Calcium. Women age 51 and older should get 1,200 mg (milligrams) of calcium every day. ? Vitamin D. Women need 600-800 International Units of vitamin D each day. ? Vitamins B12 and B6. Aim for 50 micrograms of B12 and 1.5 mg of B6 each day. General instructions  Keep track of your menstrual periods, including: ? When they occur. ? How heavy they are and how long they last. ? How much time passes between periods.  Keep track of your symptoms, noting when they start, how often you have them, and how long they last.  Use vaginal lubricants or moisturizers to help with vaginal dryness and improve comfort during sex.  Keep all follow-up visits as told by your health care provider. This is important. This includes any group therapy or counseling. Contact a health care provider if:  You are still having menstrual periods after age 55.  You have pain during sex.  You have not had a period for 12 months and you develop vaginal bleeding. Get help right away if:  You have: ? Severe depression. ? Excessive vaginal bleeding. ? Pain when you urinate. ? A fast or irregular heart beat (palpitations). ? Severe headaches. ? Abdomen (abdominal) pain or severe indigestion.  You fell and you think you have a broken bone.  You develop leg or chest pain.  You develop vision problems.  You feel a lump in your breast. Summary  Menopause is the normal  time of life when menstrual periods stop completely. It is usually confirmed by 12 months without a menstrual period.  The transition to menopause (perimenopause) most often happens between the ages of 45 and 55.  Symptoms can be managed through medicines, lifestyle changes, and complementary therapies such as acupuncture.  Eat a balanced diet that is rich in nutrients to promote bone health and heart health and to manage symptoms during menopause. This information is not intended to replace advice given to you by your health care provider. Make sure you discuss any questions you have with your health care provider. Document Released: 10/28/2003 Document Revised: 07/20/2017 Document Reviewed: 09/09/2016 Elsevier Patient Education  2020 Elsevier Inc.  

## 2019-08-13 NOTE — Progress Notes (Signed)
History:  [name] is a 53 y.o. G3P3000 who presents to clinic today for IUD removal. The patient had IUD placed in 2012. She has been afraid to come in to have it removed. She has not had any bleeding since insertion even though it is expired. She has never had testing for menopause.    The following portions of the patient's history were reviewed and updated as appropriate: allergies, current medications, family history, past medical history, social history, past surgical history and problem list.  Review of Systems:  Review of Systems  Constitutional: Negative for fever and malaise/fatigue.  Gastrointestinal: Negative for abdominal pain.  Genitourinary:       Neg - vaginal bleeding      Objective:  Physical Exam BP 133/77   Pulse 99   Wt 250 lb 4.8 oz (113.5 kg)   BMI 37.23 kg/m  Physical Exam  Vitals reviewed. Constitutional: She is oriented to person, place, and time. She appears well-developed and well-nourished.  Cardiovascular: Normal rate.  Respiratory: Effort normal.  GI: Soft.  Neurological: She is alert and oriented to person, place, and time.  Skin: Skin is warm and dry.  Psychiatric: She has a normal mood and affect.    Assessment & Plan:  1. Amenorrhea - Patient would like to wait to determine if a new IUD will be needed or if she is post-menopausal - IUD was originally placed for bleeding control  - FSH today  - Patient will be scheduled to return in 1 week for pap smear and IUD removal. Can do re-insertion then if [facility] is not elevated.  - Patient encouraged to activate MyChart    Danielle Rankin 08/13/2019 9:23 AM

## 2019-08-14 LAB — FOLLICLE STIMULATING HORMONE: FSH: 36.5 m[IU]/mL

## 2019-08-17 ENCOUNTER — Other Ambulatory Visit: Payer: Self-pay

## 2019-08-17 ENCOUNTER — Ambulatory Visit
Admission: EM | Admit: 2019-08-17 | Discharge: 2019-08-17 | Disposition: A | Discharge status: Home / Self Care | Payer: Commercial Managed Care - PPO | Attending: Emergency Medicine | Admitting: Emergency Medicine

## 2019-08-17 DIAGNOSIS — J340 Abscess, furuncle and carbuncle of nose: Secondary | ICD-10-CM | POA: Diagnosis not present

## 2019-08-17 DIAGNOSIS — L03213 Periorbital cellulitis: Secondary | ICD-10-CM

## 2019-08-17 MED ORDER — AMOXICILLIN-POT CLAVULANATE 875-125 MG PO TABS: 1.0000 | ORAL_TABLET | Freq: Two times a day (BID) | ORAL | 0 refills | Status: DC

## 2019-08-17 MED ORDER — SULFAMETHOXAZOLE-TRIMETHOPRIM 800-160 MG PO TABS: 1.0000 | ORAL_TABLET | Freq: Two times a day (BID) | ORAL | 0 refills | Status: AC

## 2019-08-17 MED ORDER — MUPIROCIN CALCIUM 2 % EX CREA: 1.0000 "application " | TOPICAL_CREAM | Freq: Two times a day (BID) | CUTANEOUS | 0 refills | Status: DC

## 2019-08-17 NOTE — Discharge Instructions (Addendum)
Use antibiotic ointment and antibiotics as prescribed: Very important to read the labeling on the bottles, boxes for appropriate dosing-you may call us if you have any confusions. Do not stick anything up your nose except for the antibiotic ointment as this can reintroduce bacteria. Very important follow-up ENT in 1 week for repeat evaluation, further management. Please go to ER in the interim for worsening pain, swelling, discharge, fever, eye pain, change in vision.

## 2019-08-17 NOTE — ED Triage Notes (Signed)
Pt presents to UC w/ c/o inner and outer nose swelling and tenderness x3 days ago. Pt does not recall injury or bug bite to nose. Pt states lymph nodes in neck are swollen, eyes are puffy, and it is causing headache.

## 2019-08-17 NOTE — ED Provider Notes (Addendum)
[facility]    CSN: [id number] Arrival date & time: 08/17/19  1101      History   Chief Complaint Chief Complaint  Patient presents with  . APPT 1100- nose swelling    HPI Pamela Shannon is a 53 y.o. female with history of diabetes presenting for inner and outer right-sided nose swelling and pain for the last 3 days.  Patient denies injury, trauma, bug bite to the area.  Feels swollen lymph nodes on affected side: Denies difficulty breathing, swelling.  Seeking evaluation today as her right eye started to become a little puffy and she developed a headache.  Denies fever, history of MRSA.  Has tried applying salt water inside right nare with Q-tip without significant relief.   Past Medical History:  Diagnosis Date  . Back pain   . Diabetes mellitus     Patient Active Problem List   Diagnosis Date Noted  . Infection of elbow ([place]) 02/09/2017  . Restless leg syndrome 07/13/2015  . High triglycerides 02/05/2015  . Type II diabetes mellitus, uncontrolled ([place]) 12/07/2014  . Encounter for IUD insertion 07/07/2011  . VAGINAL BLEEDING 01/28/2007    History reviewed. No pertinent surgical history.  OB History    Gravida  3   Para  3   Term  3   Preterm      AB      Living        SAB      TAB      Ectopic      Multiple      Live Births               Home Medications    Prior to Admission medications   Medication Sig Start Date End Date Taking? Authorizing Provider  amoxicillin-clavulanate (AUGMENTIN) 875-125 MG tablet Take 1 tablet by mouth every 12 (twelve) hours. 08/17/19   Hall-Potvin, [place], PA-C  Ascorbic Acid (VITAMIN C) 1000 MG tablet Take 1,000 mg by mouth daily.    [provider]  Blood Glucose Monitoring Suppl (ONE TOUCH ULTRA 2) W/DEVICE KIT by Does not apply route.    [provider]  buPROPion (WELLBUTRIN XL) 150 MG 24 hr tablet Take 1 tablet by mouth twice daily 03/03/19   Elayne Snare, MD  dapagliflozin  propanediol (FARXIGA) 10 MG TABS tablet Take 10 mg by mouth daily before breakfast. 05/12/19   Elayne Snare, MD  Dulaglutide (TRULICITY) 1.5 RF/1.6BW SOPN Inject weekly Patient not taking: Reported on 08/13/2019 08/04/19   Elayne Snare, MD  empagliflozin (JARDIANCE) 25 MG TABS tablet Take 25 mg by mouth daily before breakfast. 06/23/19   Elayne Snare, MD  gabapentin (NEURONTIN) 300 MG capsule Take 1 capsule (300 mg total) by mouth at bedtime. Patient taking differently: Take 300 mg by mouth at bedtime as needed.  07/12/15   Elayne Snare, MD  HUMALOG KWIKPEN 100 UNIT/ML KwikPen INJECT 10-12 UNITS SUBCUTANEOUSLY WITH LUNCH AND 15 UNITS  BEFORE DINNER 08/11/19   Elayne Snare, MD  levonorgestrel [facility]) 20 MCG/24HR IUD 1 each by Intrauterine route once.      [provider]  metFORMIN (GLUCOPHAGE-XR) 500 MG 24 hr tablet TAKE 4 TABLETS BY MOUTH ONCE DAILY WITH SUPPER Patient taking differently: Take 500 mg by mouth daily after supper. Take 3 tablets by mouth daily before supper. 07/20/19   Elayne Snare, MD  Multiple Vitamins-Minerals (MULTIVITAMIN ADULT PO) Take by mouth.    [provider]  mupirocin cream (BACTROBAN) 2 % Apply 1  application topically 2 (two) times daily. 08/17/19   Hall-Potvin, [place], PA-C  sulfamethoxazole-trimethoprim (BACTRIM DS) 800-160 MG tablet Take 1 tablet by mouth 2 (two) times daily for 7 days. 08/17/19 08/24/19  Hall-Potvin, [place], PA-C  TOUJEO MAX SOLOSTAR 300 UNIT/ML SOPN INJECT 100 UNITS SUBCUTANEOUSLY ONCE DAILY 08/11/19   Elayne Snare, MD    Family History Family History  Problem Relation Age of Onset  . Diabetes Father   . Diabetes Mother   . Fibromyalgia Mother   . Heart disease Maternal Grandmother   . Gout Maternal Grandmother   . Heart attack Maternal Grandfather     Social History Social History   Tobacco Use  . Smoking status: Former Research scientist (life sciences)  . Smokeless tobacco: Never Used  Substance Use Topics  . Alcohol use: No  . Drug use: No      Allergies   Patient has no known allergies.   Review of Systems Review of Systems  Constitutional: Negative for fatigue and fever.  HENT: Negative for ear pain, sinus pain, sore throat and voice change.   Eyes: Negative for pain, redness and visual disturbance.  Respiratory: Negative for cough and shortness of breath.   Cardiovascular: Negative for chest pain and palpitations.  Gastrointestinal: Negative for abdominal pain, diarrhea and vomiting.  Musculoskeletal: Negative for arthralgias and myalgias.  Skin: Positive for wound. Negative for rash.  Neurological: Negative for syncope and headaches.     Physical Exam Triage Vital Signs ED Triage Vitals  Enc Vitals Group     BP 08/17/19 1110 (!) 144/89     Pulse Rate 08/17/19 1110 (!) 105     Resp 08/17/19 1110 16     Temp 08/17/19 1110 98.2 F (36.8 C)     Temp Source 08/17/19 1110 Oral     SpO2 08/17/19 1110 96 %     Weight --      Height --      Head Circumference --      Peak Flow --      Pain Score 08/17/19 1116 6     Pain Loc --      Pain Edu? --      Excl. in [place]? --    No data found.  Updated Vital Signs BP (!) 144/89 (BP Location: Left Arm)   Pulse (!) 105   Temp 98.2 F (36.8 C) (Oral)   Resp 16   SpO2 96%   Visual Acuity Right Eye Distance:   Left Eye Distance:   Bilateral Distance:    Right Eye Near:   Left Eye Near:    Bilateral Near:     Physical Exam Constitutional:      General: She is not in acute distress.    Appearance: She is ill-appearing. She is not toxic-appearing or diaphoretic.  HENT:     Head: Normocephalic and atraumatic.     Right Ear: Tympanic membrane, ear canal and external ear normal.     Left Ear: Tympanic membrane, ear canal and external ear normal.     Nose:     Comments: 1 cm area of exquisitely tender swelling to right nare.  No open wound, active discharge, or foreign body inside nasal passage.  Patient does have erythema over outside of nose without  streaking.      Mouth/Throat:     Mouth: Mucous membranes are moist.     Pharynx: Oropharynx is clear. No oropharyngeal exudate or posterior oropharyngeal erythema.  Eyes:     General: No scleral icterus.  Conjunctiva/sclera: Conjunctivae normal.     Pupils: Pupils are equal, round, and reactive to light.  Neck:     Vascular: No carotid bruit.     Comments: Right-sided cervical lymphadenopathy Cardiovascular:     Rate and Rhythm: Normal rate.  Pulmonary:     Effort: Pulmonary effort is normal. No respiratory distress.     Breath sounds: No wheezing, rhonchi or rales.  Musculoskeletal:     Cervical back: Normal range of motion and neck supple. Tenderness present. No rigidity.  Lymphadenopathy:     Cervical: Cervical adenopathy present.  Skin:    Capillary Refill: Capillary refill takes less than 2 seconds.     Coloration: Skin is not jaundiced or pale.     Comments: Patient does have slight periorbital edema of right eye with infraorbital tenderness.  No fluctuance, induration appreciated there.  Neurological:     General: No focal deficit present.     Mental Status: She is alert and oriented to person, place, and time.      UC Treatments / Results  Labs (all labs ordered are listed, but only abnormal results are displayed) Labs Reviewed - No data to display  EKG   Radiology No results found.  Procedures Procedures (including critical care time)  Medications Ordered in UC Medications - No data to display  Initial Impression / Assessment and Plan / UC Course  I have reviewed the triage vital signs and the nursing notes.  Pertinent labs & imaging results that were available during my care of the patient were reviewed by me and considered in my medical decision making (see chart for details).     Patient afebrile, nontoxic.  Patient is slightly tachycardic, and exam reveals nasal abscess.  This provider very concerned for forming preseptal cellulitis: We will  start dual antibiotic therapy as outlined below.  Patient to apply Bactroban inside right nare MRSA decolonization.  Patient to follow-up with ENT in 1 week for repeat evaluation/further management or here sooner if needed.  Return precautions discussed, patient verbalized understanding and is agreeable to plan. Final Clinical Impressions(s) / UC Diagnoses   Final diagnoses:  Nasal abscess  Preseptal cellulitis     Discharge Instructions     Use antibiotic ointment and antibiotics as prescribed: Very important to read the labeling on the bottles, boxes for appropriate dosing-you may call us if you have any confusions. Do not stick anything up your nose except for the antibiotic ointment as this can reintroduce bacteria. Very important follow-up ENT in 1 week for repeat evaluation, further management. Please go to ER in the interim for worsening pain, swelling, discharge, fever, eye pain, change in vision.    ED Prescriptions    Medication Sig Dispense Auth. Provider   amoxicillin-clavulanate (AUGMENTIN) 875-125 MG tablet Take 1 tablet by mouth every 12 (twelve) hours. 14 tablet Hall-Potvin, [place], PA-C   sulfamethoxazole-trimethoprim (BACTRIM DS) 800-160 MG tablet Take 1 tablet by mouth 2 (two) times daily for 7 days. 14 tablet Hall-Potvin, [place], PA-C   mupirocin cream (BACTROBAN) 2 % Apply 1 application topically 2 (two) times daily. 15 g Hall-Potvin, [place], PA-C     PDMP not reviewed this encounter.   Hall-Potvin, [place], PA-C 08/20/19 2151    Neldon Mc [place], [place] 08/20/19 2151

## 2019-08-20 ENCOUNTER — Ambulatory Visit: Payer: Commercial Managed Care - PPO | Admitting: Student

## 2019-08-20 DIAGNOSIS — J340 Abscess, furuncle and carbuncle of nose: Secondary | ICD-10-CM | POA: Insufficient documentation

## 2019-08-22 ENCOUNTER — Telehealth: Payer: Self-pay | Admitting: Emergency Medicine

## 2019-08-22 NOTE — Telephone Encounter (Signed)
This provider contacted patient to check status from 12/27 visit for nasal abscess, preseptal cellulitis.  Patient states since discharge she has been compliant with antibiotics, followed up with ENT who extended duration of therapy, deferred I&D.  Patient states she is doing much better, "it has only a little red, there is barely any swelling".  Has follow-up with ENT next Friday.  No additional questions at this time.

## 2019-08-27 ENCOUNTER — Ambulatory Visit: Payer: Commercial Managed Care - PPO | Admitting: Medical

## 2019-09-03 ENCOUNTER — Other Ambulatory Visit: Payer: Self-pay | Admitting: Endocrinology

## 2019-09-04 ENCOUNTER — Telehealth: Payer: Self-pay

## 2019-09-04 NOTE — Telephone Encounter (Signed)
LMTCB to schedule f/u and labs

## 2019-09-04 NOTE — Telephone Encounter (Signed)
-----   Message from Reather Littler, MD sent at 09/03/2019  8:59 PM EST ----- Regarding: Needs appointment She needs to be seen in February with labs for diabetes follow-up

## 2019-09-09 NOTE — Telephone Encounter (Signed)
Patient is scheduled for labs on 10/08/19 and follow up on 10/14/19

## 2019-09-16 ENCOUNTER — Other Ambulatory Visit: Payer: Self-pay | Admitting: Endocrinology

## 2019-09-17 ENCOUNTER — Other Ambulatory Visit: Payer: Self-pay | Admitting: Endocrinology

## 2019-10-04 ENCOUNTER — Encounter: Payer: Self-pay | Admitting: Endocrinology

## 2019-10-04 DIAGNOSIS — Z0279 Encounter for issue of other medical certificate: Secondary | ICD-10-CM

## 2019-10-06 ENCOUNTER — Telehealth: Payer: Self-pay

## 2019-10-06 NOTE — Telephone Encounter (Signed)
Called pt and left voicemail informing her that her FMLA forms were available for pickup.

## 2019-10-08 ENCOUNTER — Other Ambulatory Visit: Payer: Self-pay

## 2019-10-08 ENCOUNTER — Other Ambulatory Visit (INDEPENDENT_AMBULATORY_CARE_PROVIDER_SITE_OTHER): Payer: Commercial Managed Care - PPO

## 2019-10-08 DIAGNOSIS — Z794 Long term (current) use of insulin: Secondary | ICD-10-CM | POA: Diagnosis not present

## 2019-10-08 DIAGNOSIS — E1165 Type 2 diabetes mellitus with hyperglycemia: Secondary | ICD-10-CM | POA: Diagnosis not present

## 2019-10-08 LAB — COMPREHENSIVE METABOLIC PANEL
ALT: 43 U/L — ABNORMAL HIGH (ref 0–35)
AST: 45 U/L — ABNORMAL HIGH (ref 0–37)
Albumin: 4.4 g/dL (ref 3.5–5.2)
Alkaline Phosphatase: 67 U/L (ref 39–117)
BUN: 17 mg/dL (ref 6–23)
CO2: 28 mEq/L (ref 19–32)
Calcium: 9.8 mg/dL (ref 8.4–10.5)
Chloride: 99 mEq/L (ref 96–112)
Creatinine, Ser: 0.66 mg/dL (ref 0.40–1.20)
GFR: 93.37 mL/min (ref >60.00)
Glucose, Bld: 159 mg/dL — ABNORMAL HIGH (ref 70–99)
Potassium: 4.2 mEq/L (ref 3.5–5.1)
Sodium: 135 mEq/L (ref 135–145)
Total Bilirubin: 0.5 mg/dL (ref 0.2–1.2)
Total Protein: 8.1 g/dL (ref 6.0–8.3)

## 2019-10-09 LAB — FRUCTOSAMINE: Fructosamine: 301 umol/L — ABNORMAL HIGH (ref 0–285)

## 2019-10-14 ENCOUNTER — Encounter: Payer: Self-pay | Admitting: Endocrinology

## 2019-10-14 ENCOUNTER — Other Ambulatory Visit: Payer: Self-pay

## 2019-10-14 ENCOUNTER — Other Ambulatory Visit: Payer: Self-pay | Admitting: Endocrinology

## 2019-10-14 ENCOUNTER — Ambulatory Visit (INDEPENDENT_AMBULATORY_CARE_PROVIDER_SITE_OTHER): Payer: Commercial Managed Care - PPO | Admitting: Endocrinology

## 2019-10-14 DIAGNOSIS — E6609 Other obesity due to excess calories: Secondary | ICD-10-CM | POA: Diagnosis not present

## 2019-10-14 DIAGNOSIS — R945 Abnormal results of liver function studies: Secondary | ICD-10-CM | POA: Diagnosis not present

## 2019-10-14 DIAGNOSIS — E1165 Type 2 diabetes mellitus with hyperglycemia: Secondary | ICD-10-CM

## 2019-10-14 DIAGNOSIS — Z794 Long term (current) use of insulin: Secondary | ICD-10-CM

## 2019-10-14 NOTE — Patient Instructions (Addendum)
Please start checking your blood sugars more consistently at least once a day with some readings on waking up and half of the readings about 2 hours after lunch or dinner Please check on the coverage for medication in the category of Trulicity and let us know  1 week before you finish the Humalog please let us know and we can send you the prescription for the Humulin R U-500 insulin In the meantime increase Toujeo insulin to 110 units Also at lunchtime we will need to take at least 20 units of Humalog unless eating a very light meal with low carbohydrate intake If blood sugars after dinner are still over 180 increase the dose of Humalog by at least 2 to 4 units  Continue [place] and may stop Jardiance as long as Marcelline Deist is better covered

## 2019-10-14 NOTE — Progress Notes (Signed)
Patient ID: Pamela Shannon, female   DOB: 08/09/1966, 54 y.o.   MRN: [id number]           Reason for Appointment: Endocrinology follow-up  I connected with the above-named patient by video enabled telemedicine application and verified that I am speaking with the correct person. The patient was explained the limitations of evaluation and management by telemedicine and the availability of in person appointments.  Patient also understood that there may be a patient responsible charge related to this service . Location of the patient: Patient's home . Location of the provider: Physician office Only the patient and myself were participating in the encounter The patient understood the above statements and agreed to proceed.   History of Present Illness:          Diagnosis: Type 2 diabetes mellitus, date of diagnosis: ?  2011       Past history:  At diagnosis she was having symptoms of marked fatigue, dry mouth but not clear how high her sugars were. She has been on various medications for treating her diabetes, probably metformin to start with. Also around the time of diagnosis she thinks she weighed about 300 pounds. She thinks she was able to lose weight subsequently and about 2 years ago had lost down to nearly 200 pounds All the details are not available of her previous management but she appears to have had progressively worsening requiring multiple agents and additional therapies. She thinks her A1c has been over 9% for about 2 years or more, lab records are not available She probably had been on metformin along with Tanzeum about a year or so ago. Because of inadequate control she was also given Invokamet at some point.  In 9/15 she was switched from Tanzeum to [place] and started on bedtime Levemir insulin, 20 units  She has been tried on Ozempic and Trulicity, both stopped because of cost  Recent history:  INSULIN regimen: Toujeo 100 units, Humalog 12 units acl and 22, units before  dinner  Non- hypoglycemic drugs the patient is taking are: Metformin 500 mg a.m., 1000 mg p.m., Farxiga 10 mg daily  Her A1c last visit was relatively better at 8.3, previously was high at 10.1 Fructosamine is 301  Current management, blood sugar patterns and problems identified:   She was told to start Trulicity previously but apparently it was not covered and she did not let us know  Not clear which GLP-1 drug is available  She thinks that she is not snacking as much, previously complaining of excessive hunger  However she has not checked her weight recently  HYPOGLYCEMIA is present most of the time except rarely before dinnertime blood sugars are not any better  This is despite her saying that she is walking a mile 3 days a week  She seems to be taking [place] and Jardiance both together and not clear why  Fasting readings are still high mostly even with increasing her Toujeo by 8 units  She was told to take up to 24 units of Humalog at lunchtime when she is only taking 12 units and blood sugars are as high as 284 at dinnertime      Side effects from medications have been: None Compliance with the medical regimen: Variable  Glucose monitoring:  done ?  1.   times a day         Glucometer: One Touch ultra 2      Blood Glucose readings   PRE-MEAL Fasting Lunch Dinner Bedtime  Overall  Glucose range:  146-221   127-284  241   Mean/median:  176   210   188   Previous readings:  PRE-MEAL Fasting Lunch Dinner Bedtime Overall  Glucose range: 150-224   134, 211  208   Mean/median:         Lab fasting glucose 198    Self-care: The diet that the patient has been following is: Variable    Meals: 3 meals per day, evening meal at 5 PM. Breakfast is protein shake, toast or oatmeal, sometimes cereal.    Has snacks with peanut butter crackers            Dietician visit, most recent: 11/19/14                Weight history: Previous range 200-260  Wt Readings from Last 3  Encounters:  08/13/19 250 lb 4.8 oz (113.5 kg)  06/23/19 254 lb 9.6 oz (115.5 kg)  06/06/19 254 lb 9.6 oz (115.5 kg)      Lab Results  Component Value Date   HGBA1C 8.3 (H) 08/01/2019   HGBA1C 10.1 (H) 05/05/2019   HGBA1C 10.8 (H) 01/27/2019   Lab Results  Component Value Date   MICROALBUR 16.2 (H) 10/10/2018   LDLCALC 89 01/31/2016   CREATININE 0.66 10/08/2019    Lab on 10/08/2019  Component Date Value Ref Range Status  . Fructosamine 10/08/2019 301* 0 - 285 umol/L Final   Comment: Published reference interval for apparently healthy subjects between age 70 and 82 is 74 - 285 umol/L and in a poorly controlled diabetic population is 228 - 563 umol/L with a mean of 396 umol/L.   [name] Sodium 10/08/2019 135  135 - 145 mEq/L Final  . Potassium 10/08/2019 4.2  3.5 - 5.1 mEq/L Final  . Chloride 10/08/2019 99  96 - 112 mEq/L Final  . CO2 10/08/2019 28  19 - 32 mEq/L Final  . Glucose, Bld 10/08/2019 159* 70 - 99 mg/dL Final  . BUN 10/08/2019 17  6 - 23 mg/dL Final  . Creatinine, Ser 10/08/2019 0.66  0.40 - 1.20 mg/dL Final  . Total Bilirubin 10/08/2019 0.5  0.2 - 1.2 mg/dL Final  . Alkaline Phosphatase 10/08/2019 67  39 - 117 U/L Final  . AST 10/08/2019 45* 0 - 37 U/L Final  . ALT 10/08/2019 43* 0 - 35 U/L Final  . Total Protein 10/08/2019 8.1  6.0 - 8.3 g/dL Final  . Albumin 10/08/2019 4.4  3.5 - 5.2 g/dL Final  . GFR 10/08/2019 93.37  >60.00 mL/min Final  . Calcium 10/08/2019 9.8  8.4 - 10.5 mg/dL Final      Allergies as of 10/14/2019   No Known Allergies     Medication List       Accurate as of October 14, 2019 12:57 PM. If you have any questions, ask your nurse or doctor.        STOP taking these medications   amoxicillin-clavulanate 875-125 MG tablet Commonly known as: AUGMENTIN Stopped by: Elayne Snare, MD   Jardiance 25 MG Tabs tablet Generic drug: empagliflozin Stopped by: Elayne Snare, MD   Trulicity 1.5 KZ/9.9JT Sopn Generic drug: Dulaglutide Stopped  by: Elayne Snare, MD     TAKE these medications   buPROPion 150 MG 24 hr tablet Commonly known as: WELLBUTRIN XL Take 1 tablet by mouth twice daily   Farxiga 10 MG Tabs tablet Generic drug: dapagliflozin propanediol TAKE 1 TABLET BY MOUTH ONCE DAILY BEFORE BREAKFAST  gabapentin 300 MG capsule Commonly known as: NEURONTIN Take 1 capsule (300 mg total) by mouth at bedtime. What changed:   when to take this  reasons to take this   HumaLOG KwikPen 100 UNIT/ML KwikPen Generic drug: insulin lispro Inject 12 units under the skin before breakfast and lunch, and 22 units before dinner. May adjust as needed. What changed: additional instructions   levonorgestrel 20 MCG/24HR IUD Commonly known as: MIRENA 1 each by Intrauterine route once.   metFORMIN 500 MG 24 hr tablet Commonly known as: GLUCOPHAGE-XR TAKE 4 TABLETS BY MOUTH ONCE DAILY WITH SUPPER What changed: See the new instructions.   MULTIVITAMIN ADULT PO Take by mouth.   mupirocin cream 2 % Commonly known as: BACTROBAN Apply 1 application topically 2 (two) times daily.   ONE TOUCH ULTRA 2 w/Device Kit by Does not apply route.   Toujeo Max SoloStar 300 UNIT/ML Sopn Generic drug: Insulin Glargine (2 Unit Dial) INJECT 100 UNITS SUBCUTANEOUSLY ONCE DAILY   vitamin C 1000 MG tablet Take 1,000 mg by mouth daily.       Allergies: No Known Allergies  Past Medical History:  Diagnosis Date  . Back pain   . Diabetes mellitus     History reviewed. No pertinent surgical history.  Family History  Problem Relation Age of Onset  . Diabetes Father   . Diabetes Mother   . Fibromyalgia Mother   . Heart disease Maternal Grandmother   . Gout Maternal Grandmother   . Heart attack Maternal Grandfather     Social History:  reports that she has quit smoking. She has never used smokeless tobacco. She reports that she does not drink alcohol or use drugs.    Review of Systems    She on Wellbutrin 300 mg SR prescribed  for improving her appetite and mood         Lipids: LDL below 100, not on statin treatment, triglycerides are usually high      Lab Results  Component Value Date   CHOL 155 10/10/2018   HDL 36.70 (L) 10/10/2018   LDLCALC 89 01/31/2016   LDLDIRECT 97.0 10/10/2018   TRIG 248.0 (H) 10/10/2018   CHOLHDL 4 10/10/2018               Has persistently abnormal functions She says that she is taking an OTC supplement for fatty liver  Lab Results  Component Value Date   ALT 43 (H) 10/08/2019   ALT 54 (H) 08/01/2019   ALT 50 (H) 06/20/2019        She has periodic numbness and burning in feet.   Last foot exam 10/2018  She takes gabapentin as needed for restless legs at night  Last eye exam 2017, reportedly had background retinopathy Has not followed up on this and probably cannot afford the co-pay   Possible hypertension: Her blood pressure appears to be high at times, not checking at home   BP Readings from Last 3 Encounters:  08/17/19 (!) 144/89  08/13/19 133/77  06/23/19 132/70     Physical Examination:  There were no vitals taken for this visit.   ASSESSMENT/PLAN:  Diabetes type 2, insulin requiring with obesity  See history of present illness for detailed discussion of current diabetes management, blood sugar patterns and problems identified  Her A1c is previously at 8.3, fructosamine is 301 now  Blood sugars are still averaging about 190 at home Without a GLP-1 drug blood sugar control is still suboptimal She is reportedly taking [place]  and [place] Glade together even though she was not told to do so Still requiring large doses of insulin Likely not getting enough insulin for her meals and difficult to judge her suppertime Humalog dose because of lack of postprandial monitoring   Plan:   Toujeo to be increased to 110 units  Likely needs 20 to 22 units Humalog at lunch  We will switch her to U-500 once her Humalog is finished and discussed the differences  between the 2, likely can reduce her Toujeo down to 50 units and have her take at least 30 to 40 units with meals and 10 to 20 units in the morning when she only has a snack  Other instructions as follows  She was asked to start checking your blood sugars more consistently at least once a day with some readings on waking up and half of the readings about 2 hours after lunch or dinner  Please check on the coverage for medication in the category of Trulicity and let us know  1 week before you finish the Humalog please let us know and we can send you the prescription for the Humulin R U-500 insulin In the meantime increase Toujeo insulin to 110 units Also at lunchtime we will need to take at least 20 units of Humalog unless eating a very light meal with low carbohydrate intake If blood sugars after dinner are still over 180 increase the dose of Humalog by at least 2 to 4 units  Continue Farxiga and may stop Jardiance as long as [place] Glade is better covered    Abnormal liver functions: Mildly improved  History of high blood pressure: Needs to come into the office for management  We will need to consider getting her referred for eye exam which is overdue     Patient Instructions  Please start checking your blood sugars more consistently at least once a day with some readings on waking up and half of the readings about 2 hours after lunch or dinner Please check on the coverage for medication in the category of Trulicity and let us know  1 week before you finish the Humalog please let us know and we can send you the prescription for the Humulin R U-500 insulin In the meantime increase Toujeo insulin to 110 units Also at lunchtime we will need to take at least 20 units of Humalog unless eating a very light meal with low carbohydrate intake If blood sugars after dinner are still over 180 increase the dose of Humalog by at least 2 to 4 units  Continue Farxiga and may stop Jardiance as long as  [place] Glade is better covered           Elayne Snare 10/14/2019, 12:57 PM   Note: This office note was prepared with Dragon voice rec ognition system technology. Any transcriptional errors that result from this process are unintentional.

## 2019-10-17 ENCOUNTER — Telehealth: Payer: Self-pay | Admitting: Endocrinology

## 2019-10-17 NOTE — Telephone Encounter (Signed)
Patient called stating her insurance covers trulicity and  ozempic instead of the toujeo and is requesting to have one of these called in. Patient only has a couple days left of the toujeo. Patient# [phone]  Pharmacy -    [facility] [address] [place]), [place] - [address] Phone:  [phone]  Fax:  [phone]

## 2019-10-17 NOTE — Telephone Encounter (Signed)
Spoke with patient and she states that Dr Lucianne Muss told her to check insurance to see if they would cover Trulicity or Ozempic.  I tried to explain what note states and she wants to wait and see what Dr Lucianne Muss says next week and she will just stay on what she is on for now.

## 2019-10-17 NOTE — Telephone Encounter (Signed)
Please contact pt and inform her that Dr. Remus Blake instructions were to check for coverage for a medication in the same drug class as Trulicity. Toujeo is not in the same drug class and cannot be changed and be substituted equally. Toujeo is insulin while Ozempic and Trulicity are not.

## 2019-10-19 NOTE — Telephone Encounter (Signed)
Her insurance covers trulicity and this is on Med list: she needs to fill this

## 2019-10-20 ENCOUNTER — Other Ambulatory Visit: Payer: Self-pay

## 2019-10-20 MED ORDER — TRULICITY 1.5 MG/0.5ML ~~LOC~~ SOAJ: 1.5000 mg | SUBCUTANEOUS | 0 refills | Status: DC

## 2019-10-20 MED ORDER — TOUJEO MAX SOLOSTAR 300 UNIT/ML ~~LOC~~ SOPN: 110.0000 [IU] | PEN_INJECTOR | Freq: Every day | SUBCUTANEOUS | 0 refills | Status: DC

## 2019-10-20 NOTE — Telephone Encounter (Signed)
Note does not indicate what dose of Trulicity.

## 2019-10-20 NOTE — Telephone Encounter (Signed)
Rx for Trulicity 1.5mg  once weekly sent to the pharmacy. Pt requested that every Rx from now on after this one month supply be sent to [facility].

## 2019-10-20 NOTE — Telephone Encounter (Signed)
1.5 mg weekly

## 2019-10-27 ENCOUNTER — Other Ambulatory Visit: Payer: Self-pay

## 2019-10-27 ENCOUNTER — Ambulatory Visit
Admission: EM | Admit: 2019-10-27 | Discharge: 2019-10-27 | Disposition: A | Discharge status: Home / Self Care | Payer: Commercial Managed Care - PPO | Attending: Emergency Medicine | Admitting: Emergency Medicine

## 2019-10-27 DIAGNOSIS — B352 Tinea manuum: Secondary | ICD-10-CM | POA: Diagnosis not present

## 2019-10-27 MED ORDER — METFORMIN HCL ER 500 MG PO TB24: ORAL_TABLET | ORAL | 2 refills | Status: DC

## 2019-10-27 MED ORDER — MICONAZOLE NITRATE 2 % EX AERP: 1.0000 "application " | INHALATION_SPRAY | Freq: Two times a day (BID) | CUTANEOUS | 0 refills | Status: DC | PRN

## 2019-10-27 MED ORDER — BUPROPION HCL ER (XL) 150 MG PO TB24: 150.0000 mg | ORAL_TABLET | Freq: Two times a day (BID) | ORAL | 0 refills | Status: DC

## 2019-10-27 MED ORDER — TOUJEO MAX SOLOSTAR 300 UNIT/ML ~~LOC~~ SOPN: 110.0000 [IU] | PEN_INJECTOR | Freq: Every day | SUBCUTANEOUS | 0 refills | Status: DC

## 2019-10-27 MED ORDER — GABAPENTIN 300 MG PO CAPS: 300.0000 mg | ORAL_CAPSULE | Freq: Every day | ORAL | 3 refills | Status: DC

## 2019-10-27 MED ORDER — TRIAMCINOLONE ACETONIDE 0.5 % EX OINT: 1.0000 "application " | TOPICAL_OINTMENT | Freq: Two times a day (BID) | CUTANEOUS | 0 refills | Status: DC

## 2019-10-27 MED ORDER — TRULICITY 1.5 MG/0.5ML ~~LOC~~ SOAJ: 1.5000 mg | SUBCUTANEOUS | 0 refills | Status: DC

## 2019-10-27 MED ORDER — HUMALOG KWIKPEN 100 UNIT/ML ~~LOC~~ SOPN: PEN_INJECTOR | SUBCUTANEOUS | 2 refills | Status: DC

## 2019-10-27 MED ORDER — NYSTATIN 100000 UNIT/GM EX CREA: TOPICAL_CREAM | CUTANEOUS | 0 refills | Status: DC

## 2019-10-27 MED ORDER — FARXIGA 10 MG PO TABS: ORAL_TABLET | ORAL | 0 refills | Status: DC

## 2019-10-27 NOTE — Discharge Instructions (Addendum)
Mix equal parts triamcinolone, nystatin cream and apply to hands twice daily as discussed. Once hands are dry, may apply miconazole powder as needed to keep area dry and for added antifungal component. May wear bandages as discussed to help keep hands dry, though it is important to remember to change these frequently so that your skin is not damp. Avoid using hot water as this can further dry out and irritate skin. Pat hands dry after washing. Return for worsening rash, pain, arm pain, joint pain, fevers.

## 2019-10-27 NOTE — ED Triage Notes (Signed)
Pt c/o rash to top of both hands since Thursday. States only change is at work she had to wear medium gloves instead of large and they were a little tight. Denies any new soap or lotion.

## 2019-10-27 NOTE — ED Provider Notes (Signed)
[facility]    CSN: [id number] Arrival date & time: 10/27/19  1152      History   Chief Complaint Chief Complaint  Patient presents with  . Rash    HPI Pamela Shannon is a 54 y.o. female with history of T2 DM presenting for bilateral hand rash (R>L) since Thursday.  States that she was wearing smaller than normal gloves at work and endorses "sweaty hands".  States rash is pruritic: Denies open wound, discharge, joint pain, hand pain, fever.  Has tried Neosporin without significant relief.  No change in topical products, medications, lifestyle or diet.   Past Medical History:  Diagnosis Date  . Back pain   . Diabetes mellitus     Patient Active Problem List   Diagnosis Date Noted  . Infection of elbow ([place]) 02/09/2017  . Restless leg syndrome 07/13/2015  . High triglycerides 02/05/2015  . Type II diabetes mellitus, uncontrolled ([place]) 12/07/2014  . Encounter for IUD insertion 07/07/2011  . VAGINAL BLEEDING 01/28/2007    History reviewed. No pertinent surgical history.  OB History    Gravida  3   Para  3   Term  3   Preterm      AB      Living        SAB      TAB      Ectopic      Multiple      Live Births               Home Medications    Prior to Admission medications   Medication Sig Start Date End Date Taking? Authorizing Provider  Ascorbic Acid (VITAMIN C) 1000 MG tablet Take 1,000 mg by mouth daily.    [provider]  Blood Glucose Monitoring Suppl (ONE TOUCH ULTRA 2) W/DEVICE KIT by Does not apply route.    [provider]  buPROPion (WELLBUTRIN XL) 150 MG 24 hr tablet Take 1 tablet by mouth twice daily 10/14/19   Elayne Snare, MD  Dulaglutide (TRULICITY) 1.5 GY/6.9SW SOPN Inject 1.5 mg into the skin once a week. 10/20/19   Elayne Snare, MD  FARXIGA 10 MG TABS tablet TAKE 1 TABLET BY MOUTH ONCE DAILY BEFORE BREAKFAST 10/14/19   Elayne Snare, MD  gabapentin (NEURONTIN) 300 MG capsule Take 1 capsule (300 mg total) by  mouth at bedtime. Patient taking differently: Take 300 mg by mouth at bedtime as needed.  07/12/15   Elayne Snare, MD  HUMALOG KWIKPEN 100 UNIT/ML KwikPen Inject 12 units under the skin before breakfast and lunch, and 22 units before dinner. May adjust as needed. Patient taking differently: Inject 12 units under the skin before lunch, and 22 units before dinner. May adjust as needed. 09/17/19   Elayne Snare, MD  Insulin Glargine, 2 Unit Dial, (TOUJEO MAX SOLOSTAR) 300 UNIT/ML SOPN Inject 110 Units into the skin daily. 10/20/19   Elayne Snare, MD  levonorgestrel (MIRENA) 20 MCG/24HR IUD 1 each by Intrauterine route once.      [provider]  metFORMIN (GLUCOPHAGE-XR) 500 MG 24 hr tablet TAKE 4 TABLETS BY MOUTH ONCE DAILY WITH SUPPER Patient taking differently: Take 500 mg by mouth daily after supper. Take 4 tablets by mouth daily before supper. 07/20/19   Elayne Snare, MD  Miconazole Nitrate 2 % AERP Apply 1 application topically 2 (two) times daily as needed. 10/27/19   Hall-Potvin, [place], PA-C  Multiple Vitamins-Minerals (MULTIVITAMIN ADULT PO) Take by mouth.    [provider]  mupirocin cream (BACTROBAN) 2 % Apply 1 application topically 2 (two) times daily. 08/17/19   Hall-Potvin, [place], PA-C  nystatin cream (MYCOSTATIN) Apply to affected area 2 times daily 10/27/19   Hall-Potvin, [place], PA-C  triamcinolone ointment (KENALOG) 0.5 % Apply 1 application topically 2 (two) times daily. 10/27/19   Hall-Potvin, [place], PA-C    Family History Family History  Problem Relation Age of Onset  . Diabetes Father   . Diabetes Mother   . Fibromyalgia Mother   . Heart disease Maternal Grandmother   . Gout Maternal Grandmother   . Heart attack Maternal Grandfather     Social History Social History   Tobacco Use  . Smoking status: Former Research scientist (life sciences)  . Smokeless tobacco: Never Used  Substance Use Topics  . Alcohol use: No  . Drug use: No     Allergies   Patient has no known  allergies.   Review of Systems As per HPI   Physical Exam Triage Vital Signs ED Triage Vitals [10/27/19 1158]  Enc Vitals Group     BP 135/90     Pulse Rate 99     Resp 16     Temp 98.1 F (36.7 C)     Temp Source Oral     SpO2 97 %     Weight      Height      Head Circumference      Peak Flow      Pain Score      Pain Loc      Pain Edu?      Excl. in [place]?    No data found.  Updated Vital Signs BP 135/90 (BP Location: Left Arm)   Pulse 99   Temp 98.1 F (36.7 C) (Oral)   Resp 16   SpO2 97%   Visual Acuity Right Eye Distance:   Left Eye Distance:   Bilateral Distance:    Right Eye Near:   Left Eye Near:    Bilateral Near:     Physical Exam Constitutional:      General: She is not in acute distress. HENT:     Head: Normocephalic and atraumatic.  Eyes:     General: No scleral icterus.    Pupils: Pupils are equal, round, and reactive to light.  Cardiovascular:     Rate and Rhythm: Normal rate.  Pulmonary:     Effort: Pulmonary effort is normal.  Musculoskeletal:        General: No swelling or tenderness. Normal range of motion.  Skin:    Capillary Refill: Capillary refill takes less than 2 seconds.     Coloration: Skin is not jaundiced or pale.     Findings: Rash present.     Comments: Bilateral rash on dorsal aspect of hands (R>L).  Lesions are small and scattered on left hand, larger (1 cm diameter = biggest lesion) with central clearing.  Nontender palpation.  No warmth, open wound, discharge appreciated.  No burrows, overlying erythema, streaking.  Neurological:     Mental Status: She is alert and oriented to person, place, and time.      UC Treatments / Results  Labs (all labs ordered are listed, but only abnormal results are displayed) Labs Reviewed - No data to display  EKG   Radiology No results found.  Procedures Procedures (including critical care time)  Medications Ordered in UC Medications - No data to display  Initial  Impression / Assessment and Plan / UC Course  I  have reviewed the triage vital signs and the nursing notes.  Pertinent labs & imaging results that were available during my care of the patient were reviewed by me and considered in my medical decision making (see chart for details).     H&P consistent with tinea Mannam: We will treat supportively as outlined below with antifungal, steroid, powder miconazole to help keep and dry throughout the day.  Reviewed the patient can wear gauze, dress hand under glove to help observe moisture, though should change frequently.  Return precautions discussed, patient verbalized understanding and is agreeable to plan. Final Clinical Impressions(s) / UC Diagnoses   Final diagnoses:  Tinea manuum     Discharge Instructions     Mix equal parts triamcinolone, nystatin cream and apply to hands twice daily as discussed. Once hands are dry, may apply miconazole powder as needed to keep area dry and for added antifungal component. May wear bandages as discussed to help keep hands dry, though it is important to remember to change these frequently so that your skin is not damp. Avoid using hot water as this can further dry out and irritate skin. Pat hands dry after washing. Return for worsening rash, pain, arm pain, joint pain, fevers.    ED Prescriptions    Medication Sig Dispense Auth. Provider   triamcinolone ointment (KENALOG) 0.5 % Apply 1 application topically 2 (two) times daily. 30 g Hall-Potvin, [place], PA-C   nystatin cream (MYCOSTATIN) Apply to affected area 2 times daily 30 g Hall-Potvin, [place], PA-C   Miconazole Nitrate 2 % AERP Apply 1 application topically 2 (two) times daily as needed. 85 g Hall-Potvin, [place], PA-C     PDMP not reviewed this encounter.   Hall-Potvin, [place], [place] 10/27/19 1231

## 2019-11-17 ENCOUNTER — Other Ambulatory Visit: Payer: Self-pay

## 2019-11-17 ENCOUNTER — Other Ambulatory Visit (INDEPENDENT_AMBULATORY_CARE_PROVIDER_SITE_OTHER): Payer: Commercial Managed Care - PPO

## 2019-11-17 DIAGNOSIS — E1165 Type 2 diabetes mellitus with hyperglycemia: Secondary | ICD-10-CM

## 2019-11-17 DIAGNOSIS — Z794 Long term (current) use of insulin: Secondary | ICD-10-CM

## 2019-11-17 LAB — URINALYSIS, ROUTINE W REFLEX MICROSCOPIC
Bilirubin Urine: NEGATIVE
Hgb urine dipstick: NEGATIVE
Ketones, ur: NEGATIVE
Leukocytes,Ua: NEGATIVE
Nitrite: NEGATIVE
RBC / HPF: NONE SEEN (ref 0–?)
Specific Gravity, Urine: 1.025 (ref 1.000–1.030)
Total Protein, Urine: NEGATIVE
Urine Glucose: >=1000 — AB
Urobilinogen, UA: 0.2 (ref 0.0–1.0)
pH: 5.5 (ref 5.0–8.0)

## 2019-11-17 LAB — COMPREHENSIVE METABOLIC PANEL
ALT: 33 U/L (ref 0–35)
AST: 31 U/L (ref 0–37)
Albumin: 4 g/dL (ref 3.5–5.2)
Alkaline Phosphatase: 65 U/L (ref 39–117)
BUN: 14 mg/dL (ref 6–23)
CO2: 28 mEq/L (ref 19–32)
Calcium: 9.3 mg/dL (ref 8.4–10.5)
Chloride: 103 mEq/L (ref 96–112)
Creatinine, Ser: 0.68 mg/dL (ref 0.40–1.20)
GFR: 90.17 mL/min (ref >60.00)
Glucose, Bld: 106 mg/dL — ABNORMAL HIGH (ref 70–99)
Potassium: 4.3 mEq/L (ref 3.5–5.1)
Sodium: 138 mEq/L (ref 135–145)
Total Bilirubin: 0.3 mg/dL (ref 0.2–1.2)
Total Protein: 7.2 g/dL (ref 6.0–8.3)

## 2019-11-17 LAB — MICROALBUMIN / CREATININE URINE RATIO
Creatinine,U: 86.7 mg/dL
Microalb Creat Ratio: 7.6 mg/g (ref 0.0–30.0)
Microalb, Ur: 6.6 mg/dL — ABNORMAL HIGH (ref 0.0–1.9)

## 2019-11-17 LAB — HEMOGLOBIN A1C: Hgb A1c MFr Bld: 7.7 % — ABNORMAL HIGH (ref 4.6–6.5)

## 2019-11-18 ENCOUNTER — Ambulatory Visit (INDEPENDENT_AMBULATORY_CARE_PROVIDER_SITE_OTHER): Payer: Commercial Managed Care - PPO | Admitting: Endocrinology

## 2019-11-18 ENCOUNTER — Encounter: Payer: Self-pay | Admitting: Endocrinology

## 2019-11-18 ENCOUNTER — Other Ambulatory Visit: Payer: Self-pay

## 2019-11-18 VITALS — BP 124/70 | HR 100 | Ht 68.75 in | Wt 253.0 lb

## 2019-11-18 DIAGNOSIS — E1165 Type 2 diabetes mellitus with hyperglycemia: Secondary | ICD-10-CM | POA: Diagnosis not present

## 2019-11-18 DIAGNOSIS — R945 Abnormal results of liver function studies: Secondary | ICD-10-CM | POA: Diagnosis not present

## 2019-11-18 DIAGNOSIS — E782 Mixed hyperlipidemia: Secondary | ICD-10-CM

## 2019-11-18 DIAGNOSIS — Z794 Long term (current) use of insulin: Secondary | ICD-10-CM | POA: Diagnosis not present

## 2019-11-18 MED ORDER — ONETOUCH VERIO VI STRP: 1.0000 | ORAL_STRIP | Freq: Three times a day (TID) | 2 refills | Status: AC

## 2019-11-18 MED ORDER — ONETOUCH VERIO FLEX SYSTEM W/DEVICE KIT: 1.0000 | PACK | Freq: Three times a day (TID) | 0 refills | Status: DC

## 2019-11-18 MED ORDER — FREESTYLE LIBRE 2 SENSOR MISC: 2.0000 | 3 refills | Status: DC

## 2019-11-18 MED ORDER — FREESTYLE LIBRE 2 READER DEVI: 1.0000 | Freq: Once | 0 refills | Status: AC

## 2019-11-18 MED ORDER — ONETOUCH DELICA LANCETS 33G MISC: 1.0000 | Freq: Three times a day (TID) | 2 refills | Status: DC

## 2019-11-18 NOTE — Progress Notes (Signed)
Patient ID: Pamela Shannon, female   DOB: 1965-10-05, 54 y.o.   MRN: [id number]           Reason for Appointment: Endocrinology follow-up   History of Present Illness:          Diagnosis: Type 2 diabetes mellitus, date of diagnosis: ?  2011       Past history:  At diagnosis she was having symptoms of marked fatigue, dry mouth but not clear how high her sugars were. She has been on various medications for treating her diabetes, probably metformin to start with. Also around the time of diagnosis she thinks she weighed about 300 pounds. She thinks she was able to lose weight subsequently and about 2 years ago had lost down to nearly 200 pounds All the details are not available of her previous management but she appears to have had progressively worsening requiring multiple agents and additional therapies. She thinks her A1c has been over 9% for about 2 years or more, lab records are not available She probably had been on metformin along with Tanzeum about a year or so ago. Because of inadequate control she was also given Invokamet at some point.  In 9/15 she was switched from Tanzeum to [place] and started on bedtime Levemir insulin, 20 units  She has been tried on Ozempic and Trulicity, both stopped because of cost  Recent history:  INSULIN regimen: Toujeo 110 units, Humalog 12 units acb,  acl and 22, units before dinner  Non- hypoglycemic drugs the patient is taking are: Metformin 500 mg a.m., 1000 mg p.m., Farxiga 10 mg daily, Trulicity 1.5  Her N5A is now 7.7 compared to 8.3 and better than usual   Current management, blood sugar patterns and problems identified:   She was able to start Trulicity after her last visit  However her blood sugars at the beginning of the month were much higher because she could not get her Toujeo prescription delivered  As before she is checking blood sugars mostly in the mornings and difficult to know what her blood sugar patterns are after meals   She was also told to continue [place] and she is able to do this without side effect  More recently her fasting blood sugars are getting significantly better but apparently it was not covered and she did not let us know  Recent average blood sugar is about 140 and late morning fasting reading was 106 in the lab  However she is still not checking blood sugars enough and usually not after breakfast and lunch  She says she sleeps about an hour and a half after dinner and will not check her sugar at bedtime  Again she has not changed her HUMALOG dose, was recommended a higher dose at lunchtime which she will only take if she is eating a large lunch  No hypoglycemia She has tried to walk while she is working up to 1 mile     Side effects from medications have been: None Compliance with the medical regimen: Variable  Glucose monitoring:  done ?  1.   times a day         Glucometer: One Touch ultra 2      Blood Glucose readings with average for about 2 weeks   PRE-MEAL Fasting Lunch Dinner Bedtime Overall  Glucose range:  129-210   129-249  274   Mean/median:  140     151   Previous readings:  PRE-MEAL Fasting Lunch Dinner Bedtime Overall  Glucose  range:  146-221   127-284  241   Mean/median:  176   210   188       Self-care: The diet that the patient has been following is: Variable    Meals: 3 meals per day, evening meal at 5 PM. Breakfast is protein shake, toast or oatmeal, sometimes cereal.    Has snacks with peanut butter crackers            Dietician visit, most recent: 11/19/14                Weight history: Previous range 200-260  Wt Readings from Last 3 Encounters:  11/18/19 253 lb (114.8 kg)  08/13/19 250 lb 4.8 oz (113.5 kg)  06/23/19 254 lb 9.6 oz (115.5 kg)      Lab Results  Component Value Date   HGBA1C 7.7 (H) 11/17/2019   HGBA1C 8.3 (H) 08/01/2019   HGBA1C 10.1 (H) 05/05/2019   Lab Results  Component Value Date   MICROALBUR 6.6 (H) 11/17/2019    LDLCALC 89 01/31/2016   CREATININE 0.68 11/17/2019    Lab on 11/17/2019  Component Date Value Ref Range Status  . Sodium 11/17/2019 138  135 - 145 mEq/L Final  . Potassium 11/17/2019 4.3  3.5 - 5.1 mEq/L Final  . Chloride 11/17/2019 103  96 - 112 mEq/L Final  . CO2 11/17/2019 28  19 - 32 mEq/L Final  . Glucose, Bld 11/17/2019 106* 70 - 99 mg/dL Final  . BUN 11/17/2019 14  6 - 23 mg/dL Final  . Creatinine, Ser 11/17/2019 0.68  0.40 - 1.20 mg/dL Final  . Total Bilirubin 11/17/2019 0.3  0.2 - 1.2 mg/dL Final  . Alkaline Phosphatase 11/17/2019 65  39 - 117 U/L Final  . AST 11/17/2019 31  0 - 37 U/L Final  . ALT 11/17/2019 33  0 - 35 U/L Final  . Total Protein 11/17/2019 7.2  6.0 - 8.3 g/dL Final  . Albumin 11/17/2019 4.0  3.5 - 5.2 g/dL Final  . GFR 11/17/2019 90.17  >60.00 mL/min Final  . Calcium 11/17/2019 9.3  8.4 - 10.5 mg/dL Final  . Hgb A1c MFr Bld 11/17/2019 7.7* 4.6 - 6.5 % Final   Glycemic Control Guidelines for People with Diabetes:Non Diabetic:  <6%Goal of Therapy: <7%Additional Action Suggested:  >8%   . Color, Urine 11/17/2019 YELLOW  Yellow;Lt. Yellow;Straw;Dark Yellow;Amber;Green;Red;Brown Final  . APPearance 11/17/2019 CLEAR  Clear;Turbid;Slightly Cloudy;Cloudy Final  . Specific Gravity, Urine 11/17/2019 1.025  1.000 - 1.030 Final  . pH 11/17/2019 5.5  5.0 - 8.0 Final  . Total Protein, Urine 11/17/2019 NEGATIVE  Negative Final  . Urine Glucose 11/17/2019 >=1000* Negative Final  . Ketones, ur 11/17/2019 NEGATIVE  Negative Final  . Bilirubin Urine 11/17/2019 NEGATIVE  Negative Final  . Hgb urine dipstick 11/17/2019 NEGATIVE  Negative Final  . Urobilinogen, UA 11/17/2019 0.2  0.0 - 1.0 Final  . Leukocytes,Ua 11/17/2019 NEGATIVE  Negative Final  . Nitrite 11/17/2019 NEGATIVE  Negative Final  . WBC, UA 11/17/2019 0-2/hpf  0-2/hpf Final  . RBC / HPF 11/17/2019 none seen  0-2/hpf Final  . Squamous Epithelial / LPF 11/17/2019 Rare(0-4/hpf)  Rare(0-4/hpf) Final  . Microalb,  Ur 11/17/2019 6.6* 0.0 - 1.9 mg/dL Final  . Creatinine,U 11/17/2019 86.7  mg/dL Final  . Microalb Creat Ratio 11/17/2019 7.6  0.0 - 30.0 mg/g Final      Allergies as of 11/18/2019   No Known Allergies     Medication List  Accurate as of November 18, 2019  2:43 PM. If you have any questions, ask your nurse or doctor.        STOP taking these medications   Miconazole Nitrate 2 % Aerp Stopped by: Elayne Snare, MD   mupirocin cream 2 % Commonly known as: BACTROBAN Stopped by: Elayne Snare, MD   nystatin cream Commonly known as: MYCOSTATIN Stopped by: Elayne Snare, MD   triamcinolone ointment 0.5 % Commonly known as: KENALOG Stopped by: Elayne Snare, MD     TAKE these medications   buPROPion 150 MG 24 hr tablet Commonly known as: WELLBUTRIN XL Take 1 tablet (150 mg total) by mouth 2 (two) times daily.   Farxiga 10 MG Tabs tablet Generic drug: dapagliflozin propanediol TAKE 1 TABLET BY MOUTH ONCE DAILY BEFORE BREAKFAST   gabapentin 300 MG capsule Commonly known as: NEURONTIN Take 1 capsule (300 mg total) by mouth at bedtime. What changed:   when to take this  reasons to take this   HumaLOG KwikPen 100 UNIT/ML KwikPen Generic drug: insulin lispro Inject 12 units under the skin before breakfast and lunch, and 22 units before dinner. May adjust as needed.   levonorgestrel 20 MCG/24HR IUD Commonly known as: MIRENA 1 each by Intrauterine route once.   metFORMIN 500 MG 24 hr tablet Commonly known as: GLUCOPHAGE-XR TAKE 4 TABLETS BY MOUTH ONCE DAILY WITH SUPPER   MULTIVITAMIN ADULT PO Take 1 tablet by mouth daily.   OneTouch Delica Lancets 02D Misc 1 each by Does not apply route 3 (three) times daily. E11.9 Started by: Aleatha Borer, LPN   OneTouch Verio Flex System w/Device Kit 1 each by Does not apply route 3 (three) times daily. E11.9 What changed:   how much to take  when to take this  additional instructions Changed by: Aleatha Borer, LPN    OneTouch Verio test strip Generic drug: glucose blood 1 each by Other route 3 (three) times daily. E11.9 Started by: Aleatha Borer, LPN   Toujeo Max SoloStar 300 UNIT/ML Solostar Pen Generic drug: insulin glargine (2 Unit Dial) Inject 110 Units into the skin daily.   Trulicity 1.5 XA/1.2IN Sopn Generic drug: Dulaglutide Inject 1.5 mg into the skin once a week.   vitamin C 1000 MG tablet Take 1,000 mg by mouth daily.       Allergies: No Known Allergies  Past Medical History:  Diagnosis Date  . Back pain   . Diabetes mellitus     No past surgical history on file.  Family History  Problem Relation Age of Onset  . Diabetes Father   . Diabetes Mother   . Fibromyalgia Mother   . Heart disease Maternal Grandmother   . Gout Maternal Grandmother   . Heart attack Maternal Grandfather     Social History:  reports that she has quit smoking. She has never used smokeless tobacco. She reports that she does not drink alcohol or use drugs.    Review of Systems    She on Wellbutrin 300 mg SR prescribed for improving her appetite and mood         Lipids: LDL below 100, not on statin treatment, triglycerides are usually high      Lab Results  Component Value Date   CHOL 155 10/10/2018   HDL 36.70 (L) 10/10/2018   LDLCALC 89 01/31/2016   LDLDIRECT 97.0 10/10/2018   TRIG 248.0 (H) 10/10/2018   CHOLHDL 4 10/10/2018  Had persistently abnormal functions She says that she is taking an OTC supplement for fatty liver  Lab Results  Component Value Date   ALT 33 11/17/2019   ALT 43 (H) 10/08/2019   ALT 54 (H) 08/01/2019        She has periodic numbness and burning in feet.   Last foot exam 10/2018  She takes gabapentin as needed for restless legs at night  Last eye exam 2017, reportedly had background retinopathy Has not followed up on this and probably cannot afford the co-pay   Her blood pressure tends to be high at times, not checking at home   BP  Readings from Last 3 Encounters:  11/18/19 124/70  10/27/19 135/90  08/17/19 (!) 144/89     Physical Examination:  BP 124/70   Pulse 100   Ht 5' 8.75" (1.746 m)   Wt 253 lb (114.8 kg)   SpO2 99%   BMI 37.63 kg/m    ASSESSMENT/PLAN:  Diabetes type 2, insulin requiring with obesity  See history of present illness for detailed discussion of current diabetes management, blood sugar patterns and problems identified   Her blood sugars are now significantly improved with adding Trulicity to her [place] and insulin regimen A1c is now 7.7 which is better than usual  However blood sugars have only come down in the last few days when she is able to get her insulin supply consistently She is not checking her blood sugars enough She can benefit from a CGM which she is not aware of Not able to assess her mealtime insulin requirement without more significant postprandial monitoring She can also likely be consistent with diet especially when not eating at home  Blood pressure appears to be better with staying consistently on Farxiga and also Trulicity Microalbumin normal  Abnormal liver functions: These are progressively improving likely to be with better diabetes control  Plan:   Toujeo to be continued unchanged  Although she can benefit from a higher dose of Trulicity she says she has a 96-monthsupply  We will change her to 3 mg before her next prescription is refilled  She needs to start checking blood sugars after work and at bedtime regardless of how long after meals this is  She was explained how the freestyle lElenor Legatowill work and will try to get her started on this if covered  Continue Farxiga unchanged  Discussed how to adjust her mealtime dose to keep postprandial readings at least under 180 consistently     Patient Instructions  Check blood sugars on waking up days a week  Also check blood sugars about 2 hours after meals and do this after different meals by  rotation  Recommended blood sugar levels on waking up are 90-130 and about 2 hours after meal is 130-180  Please bring your blood sugar monitor to each visit, thank you  Keep sugars after meals < 180 with enough humalog          AElayne Snare3/30/2021, 2:43 PM   Note: This office note was prepared with Dragon voice rec ognition system technology. Any transcriptional errors that result from this process are unintentional.

## 2019-11-18 NOTE — Patient Instructions (Signed)
Check blood sugars on waking up days a week  Also check blood sugars about 2 hours after meals and do this after different meals by rotation  Recommended blood sugar levels on waking up are 90-130 and about 2 hours after meal is 130-180  Please bring your blood sugar monitor to each visit, thank you  Keep sugars after meals < 180 with enough humalog

## 2019-11-24 ENCOUNTER — Telehealth: Payer: Self-pay

## 2019-11-24 DIAGNOSIS — Z0279 Encounter for issue of other medical certificate: Secondary | ICD-10-CM

## 2019-11-24 NOTE — Telephone Encounter (Signed)
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

## 2019-11-27 NOTE — Telephone Encounter (Signed)
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 Pt verbalized understanding. Pt stated that she would be in the office to pick up tomorrow or next week.

## 2019-12-03 NOTE — Telephone Encounter (Signed)
Patient still has not come to pick up paperwork. It has been mailed to the pt as of today.

## 2019-12-04 ENCOUNTER — Encounter: Payer: Commercial Managed Care - PPO | Attending: Endocrinology | Admitting: Dietician

## 2019-12-04 ENCOUNTER — Telehealth: Payer: Self-pay | Admitting: Dietician

## 2019-12-04 ENCOUNTER — Other Ambulatory Visit: Payer: Self-pay | Admitting: Endocrinology

## 2019-12-04 ENCOUNTER — Other Ambulatory Visit: Payer: Self-pay

## 2019-12-04 DIAGNOSIS — E1165 Type 2 diabetes mellitus with hyperglycemia: Secondary | ICD-10-CM | POA: Diagnosis present

## 2019-12-04 NOTE — Patient Instructions (Signed)
ADA Blood glucose Goals  80-130 fasting  Less than 180 2 hours after you start a meal

## 2019-12-04 NOTE — Progress Notes (Signed)
[employer] Personal CGM Training  Start time:1145    End time: 1205 Total time: 20 minutes Pamela Shannon was educated about the following:  -Getting to know device    (Receive) -Setting up device , trend arrows - Instructed not to take high dose of vitamin C (above 500 mg) due to problems with reading accuracy. -Inserting sensor (patient applied the sensor on his left upper back of arm  herself today with minimal assist. It appeared to be working and in warm up when she left the office) -Calibrating- none required. - using meter when test blood sugar symbol appears -Ending sensor session -Trouble shooting related to sticking, placement, skin irritation -Reviewed ADA blood sugar guidelines  Patient stated that she was having problems controlling her blood sugar.  Recommended that patient make an appointment with myself to review blood sugar management and diet.  Appointment made for Jan 08, 2020. Referral obtained.  Oran Rein, RD, LDN, CDE

## 2019-12-04 NOTE — Telephone Encounter (Signed)
completed

## 2019-12-04 NOTE — Telephone Encounter (Signed)
Done, thank you for your help with this difficult patient

## 2019-12-18 ENCOUNTER — Other Ambulatory Visit: Payer: Self-pay | Admitting: Endocrinology

## 2019-12-30 ENCOUNTER — Other Ambulatory Visit: Payer: Self-pay | Admitting: Endocrinology

## 2020-01-03 ENCOUNTER — Other Ambulatory Visit: Payer: Self-pay | Admitting: Endocrinology

## 2020-01-08 ENCOUNTER — Ambulatory Visit: Payer: Commercial Managed Care - PPO | Admitting: Dietician

## 2020-01-09 ENCOUNTER — Other Ambulatory Visit: Payer: Self-pay

## 2020-01-09 ENCOUNTER — Other Ambulatory Visit (INDEPENDENT_AMBULATORY_CARE_PROVIDER_SITE_OTHER): Payer: Commercial Managed Care - PPO

## 2020-01-09 DIAGNOSIS — E782 Mixed hyperlipidemia: Secondary | ICD-10-CM

## 2020-01-09 DIAGNOSIS — Z794 Long term (current) use of insulin: Secondary | ICD-10-CM | POA: Diagnosis not present

## 2020-01-09 DIAGNOSIS — E1165 Type 2 diabetes mellitus with hyperglycemia: Secondary | ICD-10-CM

## 2020-01-09 LAB — LIPID PANEL
Cholesterol: 175 mg/dL (ref 0–200)
HDL: 38.4 mg/dL — ABNORMAL LOW (ref >39.00)
NonHDL: 136.5
Total CHOL/HDL Ratio: 5
Triglycerides: 234 mg/dL — ABNORMAL HIGH (ref 0.0–149.0)
VLDL: 46.8 mg/dL — ABNORMAL HIGH (ref 0.0–40.0)

## 2020-01-09 LAB — COMPREHENSIVE METABOLIC PANEL
ALT: 58 U/L — ABNORMAL HIGH (ref 0–35)
AST: 64 U/L — ABNORMAL HIGH (ref 0–37)
Albumin: 4.4 g/dL (ref 3.5–5.2)
Alkaline Phosphatase: 77 U/L (ref 39–117)
BUN: 17 mg/dL (ref 6–23)
CO2: 31 mEq/L (ref 19–32)
Calcium: 9.9 mg/dL (ref 8.4–10.5)
Chloride: 100 mEq/L (ref 96–112)
Creatinine, Ser: 0.67 mg/dL (ref 0.40–1.20)
GFR: 91.68 mL/min (ref >60.00)
Glucose, Bld: 130 mg/dL — ABNORMAL HIGH (ref 70–99)
Potassium: 4.2 mEq/L (ref 3.5–5.1)
Sodium: 135 mEq/L (ref 135–145)
Total Bilirubin: 0.5 mg/dL (ref 0.2–1.2)
Total Protein: 8 g/dL (ref 6.0–8.3)

## 2020-01-09 LAB — LDL CHOLESTEROL, DIRECT: Direct LDL: 101 mg/dL

## 2020-01-09 LAB — MICROALBUMIN / CREATININE URINE RATIO
Creatinine,U: 111.8 mg/dL
Microalb Creat Ratio: 16 mg/g (ref 0.0–30.0)
Microalb, Ur: 17.9 mg/dL — ABNORMAL HIGH (ref 0.0–1.9)

## 2020-01-10 LAB — FRUCTOSAMINE: Fructosamine: 273 umol/L (ref 0–285)

## 2020-01-12 ENCOUNTER — Other Ambulatory Visit: Payer: Commercial Managed Care - PPO

## 2020-01-20 ENCOUNTER — Encounter: Payer: Commercial Managed Care - PPO | Admitting: Endocrinology

## 2020-01-20 ENCOUNTER — Encounter: Payer: Self-pay | Admitting: Endocrinology

## 2020-01-20 ENCOUNTER — Other Ambulatory Visit: Payer: Self-pay

## 2020-01-21 NOTE — Progress Notes (Signed)
This encounter was created in error - please disregard.

## 2020-01-28 ENCOUNTER — Encounter: Payer: Self-pay | Admitting: Endocrinology

## 2020-01-28 ENCOUNTER — Other Ambulatory Visit: Payer: Self-pay

## 2020-01-28 ENCOUNTER — Ambulatory Visit (INDEPENDENT_AMBULATORY_CARE_PROVIDER_SITE_OTHER): Payer: Commercial Managed Care - PPO | Admitting: Endocrinology

## 2020-01-28 VITALS — BP 126/80 | HR 100 | Ht 68.75 in | Wt 256.0 lb

## 2020-01-28 DIAGNOSIS — Z794 Long term (current) use of insulin: Secondary | ICD-10-CM | POA: Diagnosis not present

## 2020-01-28 DIAGNOSIS — E782 Mixed hyperlipidemia: Secondary | ICD-10-CM

## 2020-01-28 DIAGNOSIS — E1165 Type 2 diabetes mellitus with hyperglycemia: Secondary | ICD-10-CM

## 2020-01-28 DIAGNOSIS — E6609 Other obesity due to excess calories: Secondary | ICD-10-CM

## 2020-01-28 LAB — GLUCOSE, POCT (MANUAL RESULT ENTRY): POC Glucose: 173 mg/dl — AB (ref 70–99)

## 2020-01-28 LAB — POCT GLYCOSYLATED HEMOGLOBIN (HGB A1C): Hemoglobin A1C: 7.7 % — AB (ref 4.0–5.6)

## 2020-01-28 MED ORDER — FENOFIBRATE 145 MG PO TABS
145.0000 mg | ORAL_TABLET | Freq: Every day | ORAL | 1 refills | Status: DC
Start: 2020-01-28 — End: 2020-07-19

## 2020-01-28 MED ORDER — PANTOPRAZOLE SODIUM 40 MG PO TBEC: 40.0000 mg | DELAYED_RELEASE_TABLET | Freq: Every day | ORAL | 0 refills | Status: DC

## 2020-01-28 NOTE — Progress Notes (Signed)
Patient ID: Pamela Shannon, female   DOB: Jul 13, 1966, 54 y.o.   MRN: [id number]           Reason for Appointment: Endocrinology follow-up   History of Present Illness:          Diagnosis: Type 2 diabetes mellitus, date of diagnosis: ?  2011       Past history:  At diagnosis she was having symptoms of marked fatigue, dry mouth but not clear how high her sugars were. She has been on various medications for treating her diabetes, probably metformin to start with. Also around the time of diagnosis she thinks she weighed about 300 pounds. She thinks she was able to lose weight subsequently and about 2 years ago had lost down to nearly 200 pounds All the details are not available of her previous management but she appears to have had progressively worsening requiring multiple agents and additional therapies. She thinks her A1c has been over 9% for about 2 years or more, lab records are not available She probably had been on metformin along with Tanzeum about a year or so ago. Because of inadequate control she was also given Invokamet at some point.  In 9/15 she was switched from Tanzeum to [place] and started on bedtime Levemir insulin, 20 units  She has been tried on Ozempic and Trulicity, both stopped because of cost  Recent history:  INSULIN regimen: Toujeo 110 units, Humalog 12 units acb,  acl and 22, units before dinner  Non- hypoglycemic drugs the patient is taking are: Metformin 500 mg a.m., 1000 mg p.m., Farxiga 10 mg daily, Trulicity 1.5  Her V4B is now 7.7 again   Current management, blood sugar patterns and problems identified:   She was able to start freestyle libre a couple of months ago  However even though potentially she felt it was reading relatively lower her Elenor Legato appears to be reading falsely high in the office today by about 25 mg  Blood sugars are high throughout the day but show some variability  Despite taking Trulicity she has gained another 3 pounds  She is  not adjusting her Humalog when she has high readings before meals  Also most of her FASTING blood sugars are high  This is partly related to her getting up during the night and drinking chocolate milk for her heartburn  She does try to walk sometimes up to 1 mile  She has no difficulty keeping her libre on her arm consistently  No hypoglycemia       Side effects from medications have been: None Compliance with the medical regimen: Variable   CONTINUOUS GLUCOSE MONITORING RECORD INTERPRETATION    Dates of Recording: Last 2 weeks  Sensor description: Elenor Legato version 2  Results statistics:   CGM use % of time  95  Average and SD  196, GV 23  Time in range    39    %  % Time Above 180  50  % Time above 250  11  % Time Below target  0    PRE-MEAL Fasting Lunch Dinner Bedtime Overall  Glucose range:       Mean/median:  179  184  213  200    POST-MEAL PC Breakfast PC Lunch PC Dinner  Glucose range:     Mean/median:  182  217  232    Glycemic patterns summary: Highest blood sugars are around 8 PM and blood sugars are above target range on an average most of the time  except early part of the night but on an average blood sugars are not below 163 for any time slot She is checking her blood sugar several times a day  Hyperglycemic episodes are occurring mostly midday and evening to a variable extent, mostly with lunch  Hypoglycemic episodes not present  Overnight periods: Blood sugars are averaging about 180 at midnight, decreasing slightly and then going up again after 4 AM  Preprandial periods: Blood sugars are averaging 180-200 at breakfast and lunch and slightly higher at dinnertime  Postprandial periods:   She has variable increase in blood sugars after meals, usually not vomiting after breakfast occasionally spiking after lunch, highest blood sugar of over 351 6/5 after dinner  Previous data:      PRE-MEAL Fasting Lunch Dinner Bedtime Overall  Glucose range:   129-210   129-249  274   Mean/median:  140     151   Previous readings:     Self-care: The diet that the patient has been following is: Variable    Meals: 3 meals per day, evening meal at 5 PM. Breakfast is protein shake, toast or oatmeal, sometimes cereal.    Has snacks with peanut butter crackers            Dietician visit, most recent: 11/19/14                Weight history: Previous range 200-260  Wt Readings from Last 3 Encounters:  01/28/20 256 lb (116.1 kg)  11/18/19 253 lb (114.8 kg)  08/13/19 250 lb 4.8 oz (113.5 kg)      Lab Results  Component Value Date   HGBA1C 7.7 (A) 01/28/2020   HGBA1C 7.7 (H) 11/17/2019   HGBA1C 8.3 (H) 08/01/2019   Lab Results  Component Value Date   MICROALBUR 17.9 (H) 01/09/2020   LDLCALC 89 01/31/2016   CREATININE 0.67 01/09/2020    Office Visit on 01/28/2020  Component Date Value Ref Range Status  . Hemoglobin A1C 01/28/2020 7.7* 4.0 - 5.6 % Final  . POC Glucose 01/28/2020 173* 70 - 99 mg/dl Final      Allergies as of 01/28/2020   No Known Allergies     Medication List       Accurate as of January 28, 2020 11:59 PM. If you have any questions, ask your nurse or doctor.        buPROPion 150 MG 24 hr tablet Commonly known as: WELLBUTRIN XL TAKE 1 TABLET BY MOUTH  TWICE DAILY   Farxiga 10 MG Tabs tablet Generic drug: dapagliflozin propanediol TAKE 1 TABLET BY MOUTH ONCE DAILY BEFORE BREAKFAST   fenofibrate 145 MG tablet Commonly known as: Tricor Take 1 tablet (145 mg total) by mouth daily. Started by: Elayne Snare, MD   FreeStyle Libre 2 Sensor Misc 2 Devices by Does not apply route every 14 (fourteen) days.   gabapentin 300 MG capsule Commonly known as: NEURONTIN Take 1 capsule (300 mg total) by mouth at bedtime. What changed:   when to take this  reasons to take this   HumaLOG KwikPen 100 UNIT/ML KwikPen Generic drug: insulin lispro Inject 12 units under the skin before breakfast and lunch, and 22 units  before dinner. May adjust as needed.   levonorgestrel 20 MCG/24HR IUD Commonly known as: MIRENA 1 each by Intrauterine route once.   metFORMIN 1000 MG tablet Commonly known as: GLUCOPHAGE Take 1,000 mg by mouth daily. What changed: Another medication with the same name was removed. Continue taking this medication,  and follow the directions you see here. Changed by: Elayne Snare, MD   MULTIVITAMIN ADULT PO Take 1 tablet by mouth daily.   OneTouch Delica Lancets 41P Misc 1 each by Does not apply route 3 (three) times daily. E11.9   OneTouch Verio Flex System w/Device Kit 1 each by Does not apply route 3 (three) times daily. E11.9   OneTouch Verio test strip Generic drug: glucose blood 1 each by Other route 3 (three) times daily. E11.9   pantoprazole 40 MG tablet Commonly known as: Protonix Take 1 tablet (40 mg total) by mouth daily before breakfast. Started by: Elayne Snare, MD   Toujeo Max SoloStar 300 UNIT/ML Solostar Pen Generic drug: insulin glargine (2 Unit Dial) INJECT SUBCUTANEOUSLY 110  UNITS DAILY   Trulicity 1.5 FX/9.0WI Sopn Generic drug: Dulaglutide INJECT 1.5 MG INTO THE SKIN WEEKLY   vitamin C 1000 MG tablet Take 1,000 mg by mouth daily.       Allergies: No Known Allergies  Past Medical History:  Diagnosis Date  . Back pain   . Diabetes mellitus     History reviewed. No pertinent surgical history.  Family History  Problem Relation Age of Onset  . Diabetes Father   . Diabetes Mother   . Fibromyalgia Mother   . Heart disease Maternal Grandmother   . Gout Maternal Grandmother   . Heart attack Maternal Grandfather     Social History:  reports that she has quit smoking. She has never used smokeless tobacco. She reports that she does not drink alcohol and does not use drugs.    Review of Systems    She on Wellbutrin 300 mg SR prescribed for improving her appetite and mood         Lipids: LDL below 100, not on statin treatment, triglycerides  are usually high      Lab Results  Component Value Date   CHOL 175 01/09/2020   HDL 38.40 (L) 01/09/2020   LDLCALC 89 01/31/2016   LDLDIRECT 101.0 01/09/2020   TRIG 234.0 (H) 01/09/2020   CHOLHDL 5 01/09/2020               Had persistently abnormal functions  Has been taking an OTC supplement for fatty liver  Lab Results  Component Value Date   ALT 58 (H) 01/09/2020   ALT 33 11/17/2019   ALT 43 (H) 10/08/2019        She has periodic numbness and burning in feet.   Last foot exam 10/2018  She takes gabapentin as needed for restless legs at night  Last eye exam 2017, reportedly had background retinopathy Has not followed up on this and probably cannot afford the co-pay   Recent blood pressure fairly good  BP Readings from Last 3 Encounters:  01/28/20 126/80  11/18/19 124/70  10/27/19 135/90   She gets significant heartburn and occasionally nausea.  She thinks this may have started before starting Trulicity and has not discussed with PCP Only taking generic Pepcid OTC without significant relief, may also have symptoms during the night  Physical Examination:  BP 126/80 (BP Location: Left Arm, Patient Position: Sitting, Cuff Size: Normal)   Pulse 100   Ht 5' 8.75" (1.746 m)   Wt 256 lb (116.1 kg)   SpO2 95%   BMI 38.08 kg/m    ASSESSMENT/PLAN:  Diabetes type 2, insulin requiring with obesity  See history of present illness for detailed discussion of current diabetes management, blood sugar patterns and problems identified  A1c is again 7.7  However with her CGM her blood sugars are averaging only 200 recently although not clear if this is accurate Her lab was done last month when her glucose was 130 She is still insulin resistant Not losing weight despite using Trulicity which may or may not be causing her symptoms of reflux  Appears to need higher dose of mealtime insulin especially based on her diet which she is not adjusting Also can do better with  increasing her Humalog when premeal blood sugar is high and this was discussed Discussed blood sugar targets  Abnormal liver functions: These are variable  Lipids: Needs fasting lipids once blood sugars are better controlled and consider cardiovascular risk reduction with statin  Plan:   Toujeo to be increased by 10 units  Take extra 4 to 8 units of Humalog premeal when blood sugars are more than 180 now  Also will increase the dose if eating a higher carbohydrate meal  She will try 3 mg of Trulicity weekly and use of her supply  On the next prescription will consider the same drug or Ozempic based on her response  Stay on Farxiga 10 mg  Periodically check blood sugars with fingerstick to compare with her libre  Increase exercise  Follow-up in 3 months  She will take Protonix for her reflux which is covered by insurance but follow-up with PCP if continues to have symptoms     Patient Instructions  Trulicity 27m weekly, take 2 of 1.5  Toujeo 120 daily  Extra 4-8 u Humalog if sugar >180        AElayne Snare6/05/2020, 9:39 AM   Note: This office note was prepared with Dragon voice rec ognition system technology. Any transcriptional errors that result from this process are unintentional.

## 2020-01-28 NOTE — Patient Instructions (Addendum)
Trulicity 3mg  weekly, take 2 of 1.5  Toujeo 120 daily  Extra 4-8 u Humalog if sugar >180

## 2020-01-29 ENCOUNTER — Other Ambulatory Visit: Payer: Self-pay

## 2020-01-29 DIAGNOSIS — E1165 Type 2 diabetes mellitus with hyperglycemia: Secondary | ICD-10-CM

## 2020-01-29 MED ORDER — ONETOUCH VERIO FLEX SYSTEM W/DEVICE KIT: 1.0000 | PACK | Freq: Three times a day (TID) | 0 refills | Status: AC

## 2020-01-29 NOTE — Telephone Encounter (Signed)
Dr. Lucianne Muss pt.

## 2020-03-16 ENCOUNTER — Ambulatory Visit: Payer: Commercial Managed Care - PPO | Admitting: Nutrition

## 2020-03-18 ENCOUNTER — Ambulatory Visit: Payer: Commercial Managed Care - PPO | Admitting: Skilled Nursing Facility1

## 2020-03-23 ENCOUNTER — Telehealth: Payer: Self-pay

## 2020-03-23 NOTE — Telephone Encounter (Signed)
LMTCB to schedule [facility] Camera exam

## 2020-04-13 ENCOUNTER — Telehealth: Payer: Self-pay

## 2020-04-13 NOTE — Telephone Encounter (Signed)
Patient called back stating she did not wish to reschedule Camera Exam at this time - she said she would call back.

## 2020-04-13 NOTE — Telephone Encounter (Signed)
ATC patient to get her rescheduled for the [facility] Camera Exam that she canceled-I left a VM requesting a call back to reschedule this and her labs on 04/29/20

## 2020-04-14 NOTE — Telephone Encounter (Signed)
Noted  

## 2020-04-21 ENCOUNTER — Encounter: Payer: Commercial Managed Care - PPO | Attending: Endocrinology | Admitting: Dietician

## 2020-04-21 ENCOUNTER — Ambulatory Visit: Payer: Commercial Managed Care - PPO

## 2020-04-21 ENCOUNTER — Other Ambulatory Visit: Payer: Self-pay

## 2020-04-21 ENCOUNTER — Encounter: Payer: Self-pay | Admitting: Dietician

## 2020-04-21 DIAGNOSIS — E1165 Type 2 diabetes mellitus with hyperglycemia: Secondary | ICD-10-CM | POA: Diagnosis not present

## 2020-04-21 NOTE — Progress Notes (Signed)
Diabetes Self-Management Education  Visit Type: First/Initial  Appt. Start Time: 10:55am   Appt. End Time: 11:25am  04/21/2020  [name], identified by name and date of birth, is a 54 y.o. female with a diagnosis of Diabetes: Type 2.   ASSESSMENT  Patient states she has tried various diets in the past to help manage her blood sugar but that she does not like a lot of "those foods." States she has tried things such as Regulatory affairs officer and eating very lean (lots of salads) but that she doesn't like many healthy foods. Typical meal pattern is 2 meals per day plus snack in the evening for dinner. States lunch is her biggest and "worst" meal of the day. Drinks lots of water. States that, working as a Midwife during the summer months, she doesn't have much of an appetite to eat when she gets home from work so dinner is lighter such as popcorn and yogurt. States she is usually quite stressed and does not sleep well at night. States she feels comfortable checking her blood sugar, and thinks her next A1c will be higher since her blood glucose readings have been higher (200s).    Diabetes Self-Management Education - 04/21/20 1056      Visit Information   Visit Type First/Initial      Initial Visit   Diabetes Type Type 2    Are you currently following a meal plan? No    Are you taking your medications as prescribed? Yes    Date Diagnosed 2007      Health Coping   How would you rate your overall health? Fair      Psychosocial Assessment   Patient Belief/Attitude about Diabetes Defeat/Burnout    Self-care barriers None    Patient Concerns Nutrition/Meal planning;Weight Control;Glycemic Control    Special Needs None    Preferred Learning Style No preference indicated    Learning Readiness Contemplating    How often do you need to have someone help you when you read instructions, pamphlets, or other written materials from your doctor or pharmacy? 1 - Never    What is the last grade level  you completed in school? GED      Complications   Last HgB A1C per patient/outside source 7.7 %   June 2016   How often do you check your blood sugar? 1-2 times/day    Fasting Blood glucose range (mg/dL) 960-454;>098    Postprandial Blood glucose range (mg/dL) 119-147    Number of hypoglycemic episodes per month 0    Number of hyperglycemic episodes per week 1    Can you tell when your blood sugar is high? Yes    What do you do if your blood sugar is high? drink water    Have you had a dilated eye exam in the past 12 months? No    Have you had a dental exam in the past 12 months? No    Are you checking your feet? Yes    How many days per week are you checking your feet? 7      Dietary Intake   Breakfast peanut butter crackers + 2 bottles water   or Belvita breakfast bars   Snack (morning) protein shake    Lunch bologna sandwich + chips   or pizza (or [place] chicken & rice + chips & salsa)   Dinner popcorn + Chobani flip yogurt    Beverage(s) water, Crystal Light, sometimes sweet tea, green tea  Exercise   Exercise Type ADL's      Patient Education   Previous Diabetes Education No    Disease state  Explored patient's options for treatment of their diabetes    Nutrition management  Role of diet in the treatment of diabetes and the relationship between the three main macronutrients and blood glucose level;Information on hints to eating out and maintain blood glucose control.;Meal options for control of blood glucose level and chronic complications.    Physical activity and exercise  Role of exercise on diabetes management, blood pressure control and cardiac health.    Monitoring Purpose and frequency of SMBG.    Acute complications Discussed and identified patients' treatment of hyperglycemia.;Taught treatment of hypoglycemia - the 15 rule.    Chronic complications Relationship between chronic complications and blood glucose control    Psychosocial adjustment Role of stress on  diabetes;Helped patient identify a support system for diabetes management;Identified and addressed patients feelings and concerns about diabetes      Individualized Goals (developed by patient)   Nutrition General guidelines for healthy choices and portions discussed    Medications take my medication as prescribed      Outcomes   Expected Outcomes Demonstrated interest in learning. Expect positive outcomes    Future DMSE PRN           Individualized Plan for Diabetes Self-Management Training:  Learning Objective:  Patient will have a greater understanding of diabetes self-management. Patient education plan is to attend individual and/or group sessions per assessed needs and concerns.  Expected Outcomes:  Demonstrated interest in learning. Expect positive outcomes. Potential barriers include contemplative stage of change.   Education material provided: My Plate, Meal Ideas, Breakfast Ideas, Balanced Snacks  If problems or questions, patient to contact team via:  Phone and Email  Future DSME appointment: PRN

## 2020-04-29 ENCOUNTER — Other Ambulatory Visit (INDEPENDENT_AMBULATORY_CARE_PROVIDER_SITE_OTHER): Payer: Commercial Managed Care - PPO

## 2020-04-29 ENCOUNTER — Other Ambulatory Visit: Payer: Self-pay

## 2020-04-29 DIAGNOSIS — Z794 Long term (current) use of insulin: Secondary | ICD-10-CM

## 2020-04-29 DIAGNOSIS — E782 Mixed hyperlipidemia: Secondary | ICD-10-CM | POA: Diagnosis not present

## 2020-04-29 DIAGNOSIS — E1165 Type 2 diabetes mellitus with hyperglycemia: Secondary | ICD-10-CM | POA: Diagnosis not present

## 2020-04-29 LAB — COMPREHENSIVE METABOLIC PANEL
ALT: 63 U/L — ABNORMAL HIGH (ref 0–35)
AST: 69 U/L — ABNORMAL HIGH (ref 0–37)
Albumin: 4.1 g/dL (ref 3.5–5.2)
Alkaline Phosphatase: 73 U/L (ref 39–117)
BUN: 18 mg/dL (ref 6–23)
CO2: 27 mEq/L (ref 19–32)
Calcium: 9.3 mg/dL (ref 8.4–10.5)
Chloride: 103 mEq/L (ref 96–112)
Creatinine, Ser: 0.67 mg/dL (ref 0.40–1.20)
GFR: 91.57 mL/min (ref >60.00)
Glucose, Bld: 194 mg/dL — ABNORMAL HIGH (ref 70–99)
Potassium: 4.2 mEq/L (ref 3.5–5.1)
Sodium: 136 mEq/L (ref 135–145)
Total Bilirubin: 0.5 mg/dL (ref 0.2–1.2)
Total Protein: 7.7 g/dL (ref 6.0–8.3)

## 2020-04-29 LAB — LIPID PANEL
Cholesterol: 169 mg/dL (ref 0–200)
HDL: 32.7 mg/dL — ABNORMAL LOW
NonHDL: 136.7
Total CHOL/HDL Ratio: 5
Triglycerides: 310 mg/dL — ABNORMAL HIGH (ref 0.0–149.0)
VLDL: 62 mg/dL — ABNORMAL HIGH (ref 0.0–40.0)

## 2020-04-29 LAB — LDL CHOLESTEROL, DIRECT: Direct LDL: 98 mg/dL

## 2020-04-29 LAB — HEMOGLOBIN A1C: Hgb A1c MFr Bld: 8.3 % — ABNORMAL HIGH (ref 4.6–6.5)

## 2020-05-03 ENCOUNTER — Other Ambulatory Visit: Payer: Commercial Managed Care - PPO

## 2020-05-06 ENCOUNTER — Encounter: Payer: Self-pay | Admitting: Endocrinology

## 2020-05-06 ENCOUNTER — Other Ambulatory Visit: Payer: Self-pay

## 2020-05-06 ENCOUNTER — Ambulatory Visit (INDEPENDENT_AMBULATORY_CARE_PROVIDER_SITE_OTHER): Payer: Commercial Managed Care - PPO | Admitting: Endocrinology

## 2020-05-06 VITALS — BP 108/72 | HR 107 | Ht 68.75 in | Wt 260.6 lb

## 2020-05-06 DIAGNOSIS — E1165 Type 2 diabetes mellitus with hyperglycemia: Secondary | ICD-10-CM

## 2020-05-06 DIAGNOSIS — R945 Abnormal results of liver function studies: Secondary | ICD-10-CM

## 2020-05-06 DIAGNOSIS — Z794 Long term (current) use of insulin: Secondary | ICD-10-CM

## 2020-05-06 DIAGNOSIS — E782 Mixed hyperlipidemia: Secondary | ICD-10-CM | POA: Diagnosis not present

## 2020-05-06 LAB — POCT GLUCOSE (DEVICE FOR HOME USE): POC Glucose: 263 mg/dl — AB (ref 70–99)

## 2020-05-06 NOTE — Progress Notes (Signed)
Patient ID: Pamela Shannon, female   DOB: 1966/02/10, 54 y.o.   MRN: [id number]           Reason for Appointment: Endocrinology follow-up   History of Present Illness:          Diagnosis: Type 2 diabetes mellitus, date of diagnosis: ?  2011       Past history:  At diagnosis she was having symptoms of marked fatigue, dry mouth but not clear how high her sugars were. She has been on various medications for treating her diabetes, probably metformin to start with. Also around the time of diagnosis she thinks she weighed about 300 pounds. She thinks she was able to lose weight subsequently and about 2 years ago had lost down to nearly 200 pounds All the details are not available of her previous management but she appears to have had progressively worsening requiring multiple agents and additional therapies. She thinks her A1c has been over 9% for about 2 years or more, lab records are not available She probably had been on metformin along with Tanzeum about a year or so ago. Because of inadequate control she was also given Invokamet at some point.  In 9/15 she was switched from Tanzeum to [place] and started on bedtime Levemir insulin, 20 units  She had tried on Ozempic and Trulicity, both stopped because of cost  Recent history:  INSULIN regimen: Toujeo 110 units, Humalog 12 units acb, 22 acl and 22, units before dinner  Non- hypoglycemic drugs the patient is taking are: Metformin 500 mg a.m., 1000 mg p.m., Farxiga 10 mg daily, Trulicity 1.5  Her D9I is now 8.3   Current management, blood sugar patterns and problems identified:   She was not able to continue freestyle libre a few weeks ago  She had some malfunctioning sensors and it would also follow-up and she did not pursue it  Also it is costing her $75  Blood sugars appear to be overall higher  However not clear how often she is monitoring  Blood sugar well over 200 today even though she says she took her Humalog before  eating, usually eating out  Most of her higher calorie intake is at lunchtime as the evening she will mostly have yogurt and popcorn  She did not increase her TOUJEO or her Trulicity as directed on the last visit  She does not do much exercise again  She is also not planning her meals well and has gained another 4 pounds  Has finally seen the dietitian earlier this month  Recent blood sugars: FASTING high 100+ usually, after meals as high as 330     Side effects from medications have been: None Compliance with the medical regimen: Variable  Previous data:   CGM use % of time  95  Average and SD  196, GV 23  Time in range    39    %  % Time Above 180  50  % Time above 250  11  % Time Below target  0    PRE-MEAL Fasting Lunch Dinner Bedtime Overall  Glucose range:       Mean/median:  179  184  213  200    POST-MEAL PC Breakfast PC Lunch PC Dinner  Glucose range:     Mean/median:  182  217  232        Self-care: The diet that the patient has been following is: Variable    Meals: 3 meals per day, evening meal at  5 PM. Breakfast is protein shake, toast or oatmeal, sometimes cereal.    Has snacks with peanut butter crackers            Dietician visit, most recent: 04/21/2020               Weight history: Previous range 200-260  Wt Readings from Last 3 Encounters:  05/06/20 260 lb 9.6 oz (118.2 kg)  01/28/20 256 lb (116.1 kg)  11/18/19 253 lb (114.8 kg)      Lab Results  Component Value Date   HGBA1C 8.3 (H) 04/29/2020   HGBA1C 7.7 (A) 01/28/2020   HGBA1C 7.7 (H) 11/17/2019   Lab Results  Component Value Date   MICROALBUR 17.9 (H) 01/09/2020   LDLCALC 89 01/31/2016   CREATININE 0.67 04/29/2020    Office Visit on 05/06/2020  Component Date Value Ref Range Status  . POC Glucose 05/06/2020 263* 70 - 99 mg/dl Final      Allergies as of 05/06/2020   No Known Allergies     Medication List       Accurate as of May 06, 2020  3:10 PM. If you have  any questions, ask your nurse or doctor.        STOP taking these medications   pantoprazole 40 MG tablet Commonly known as: Protonix Stopped by: Elayne Snare, MD     TAKE these medications   buPROPion 150 MG 24 hr tablet Commonly known as: WELLBUTRIN XL TAKE 1 TABLET BY MOUTH  TWICE DAILY   Farxiga 10 MG Tabs tablet Generic drug: dapagliflozin propanediol TAKE 1 TABLET BY MOUTH ONCE DAILY BEFORE BREAKFAST   fenofibrate 145 MG tablet Commonly known as: Tricor Take 1 tablet (145 mg total) by mouth daily.   FreeStyle Libre 2 Sensor Misc 2 Devices by Does not apply route every 14 (fourteen) days.   gabapentin 300 MG capsule Commonly known as: NEURONTIN Take 1 capsule (300 mg total) by mouth at bedtime. What changed:   when to take this  reasons to take this   HumaLOG KwikPen 100 UNIT/ML KwikPen Generic drug: insulin lispro Inject into the skin. Inject 12 units under the skin at breakfast, 22 units at lunch, and 12 at supper. What changed: Another medication with the same name was removed. Continue taking this medication, and follow the directions you see here. Changed by: Elayne Snare, MD   levonorgestrel 20 MCG/24HR IUD Commonly known as: MIRENA 1 each by Intrauterine route once.   metFORMIN 1000 MG tablet Commonly known as: GLUCOPHAGE Take 1,000 mg by mouth daily.   MULTIVITAMIN ADULT PO Take 1 tablet by mouth daily.   OneTouch Delica Lancets 97D Misc 1 each by Does not apply route 3 (three) times daily. E11.9   OneTouch Verio Flex System w/Device Kit 1 each by Does not apply route 3 (three) times daily. E11.9   OneTouch Verio test strip Generic drug: glucose blood 1 each by Other route 3 (three) times daily. E11.9   Toujeo Max SoloStar 300 UNIT/ML Solostar Pen Generic drug: insulin glargine (2 Unit Dial) INJECT SUBCUTANEOUSLY 110  UNITS DAILY   Trulicity 1.5 ZH/2.9JM Sopn Generic drug: Dulaglutide INJECT 1.5 MG INTO THE SKIN WEEKLY   vitamin C 1000 MG  tablet Take 1,000 mg by mouth daily.       Allergies: No Known Allergies  Past Medical History:  Diagnosis Date  . Back pain   . Diabetes mellitus     History reviewed. No pertinent surgical history.  Family History  Problem Relation Age of Onset  . Diabetes Father   . Diabetes Mother   . Fibromyalgia Mother   . Heart disease Maternal Grandmother   . Gout Maternal Grandmother   . Heart attack Maternal Grandfather     Social History:  reports that she has quit smoking. She has never used smokeless tobacco. She reports that she does not drink alcohol and does not use drugs.    Review of Systems    She on Wellbutrin 300 mg SR prescribed for improving her appetite and mood         Lipids: LDL below 100, not on statin treatment, triglycerides are usually high      Lab Results  Component Value Date   CHOL 169 04/29/2020   HDL 32.70 (L) 04/29/2020   LDLCALC 89 01/31/2016   LDLDIRECT 98.0 04/29/2020   TRIG 310.0 (H) 04/29/2020   CHOLHDL 5 04/29/2020               Had persistently abnormal functions  Has been taking an OTC supplement for fatty liver  Lab Results  Component Value Date   ALT 63 (H) 04/29/2020   ALT 58 (H) 01/09/2020   ALT 33 11/17/2019        She has periodic numbness and burning in feet.   Last foot exam 10/2018  She takes gabapentin as needed for restless legs at night  Last eye exam 2017, reportedly had background retinopathy Has not followed up on this and cannot afford the co-pay   No problems with high blood pressure:  BP Readings from Last 3 Encounters:  05/06/20 108/72  01/28/20 126/80  11/18/19 124/70     Physical Examination:  BP 108/72 (BP Location: Left Arm, Patient Position: Sitting, Cuff Size: Large)   Pulse (!) 107   Ht 5' 8.75" (1.746 m)   Wt 260 lb 9.6 oz (118.2 kg)   SpO2 97%   BMI 38.76 kg/m    ASSESSMENT/PLAN:  Diabetes type 2, insulin requiring with obesity  See history of present illness for detailed  discussion of current diabetes management, blood sugar patterns and problems identified   A1c is again 7.7  However with her CGM her blood sugars are averaging only 200 recently although not clear if this is accurate Her lab was done last month when her glucose was 130 She is still insulin resistant Not losing weight despite using Trulicity which may or may not be causing her symptoms of reflux  Appears to need higher dose of mealtime insulin especially based on her diet which she is not adjusting Also can do better with increasing her Humalog when premeal blood sugar is high and this was discussed Discussed blood sugar targets  Abnormal liver functions: Again likely fatty liver, she needs to discuss this with her PCP  Lipids: Needs better blood sugar control since her triglycerides are continuing to be high Likely needs to be on fenofibrate, will consider on the next visit  Plan:   Toujeo to be increased by 10 units at least and go up further until morning sugars are below 130 and discussed need to do this consistently  She will likely needs another 4 units of Humalog for meals except breakfast unless her morning sugar is high  She will look into getting the freestyle libre and using it for at least 2 weeks in a month, given her patient information on how to keep it on consistently and she will also call the company for any troubleshooting  Keep postprandial readings under 180  She will take 2 shots of the 1.5 mg of Trulicity weekly and 3 mg will be sent in  On the next prescription will consider the same drug or Ozempic based on her response  Stay on Farxiga 10 mg  She was shown how the OmniPod insulin pump works along with the benefits of better control, lower insulin doses and reducing need for injectable insulin.  She appears to be interested and will verify her out-of-pocket expense  Follow-up in 2 months or when she starts the pump  May try Humalog U-200 in the  pump  Regular walking for exercise     Patient Instructions  Toujeo 120 and go up 5 U until am sugar <130  Humalog 26 lunch and dinner  Check blood sugars on waking up 3-4 days a week  Also check blood sugars about 2 hours after meals and do this after different meals by rotation  Recommended blood sugar levels on waking up are 90-130 and about 2 hours after meal is 130-180  Please bring your blood sugar monitor to each visit, thank you  Trulicity 2 of 5.8XE weekly        Elayne Snare 05/06/2020, 3:10 PM   Note: This office note was prepared with Dragon voice rec ognition system technology. Any transcriptional errors that result from this process are unintentional.

## 2020-05-06 NOTE — Patient Instructions (Addendum)
Toujeo 120 and go up 5 U until am sugar <130  Humalog 26 lunch and dinner  Check blood sugars on waking up 3-4 days a week  Also check blood sugars about 2 hours after meals and do this after different meals by rotation  Recommended blood sugar levels on waking up are 90-130 and about 2 hours after meal is 130-180  Please bring your blood sugar monitor to each visit, thank you  Trulicity 2 of 1.5mg  weekly

## 2020-05-07 ENCOUNTER — Other Ambulatory Visit: Payer: Self-pay | Admitting: Endocrinology

## 2020-05-27 ENCOUNTER — Other Ambulatory Visit: Payer: Self-pay | Admitting: Endocrinology

## 2020-07-02 ENCOUNTER — Other Ambulatory Visit: Payer: Commercial Managed Care - PPO

## 2020-07-08 ENCOUNTER — Ambulatory Visit: Payer: Commercial Managed Care - PPO | Admitting: Endocrinology

## 2020-07-09 ENCOUNTER — Other Ambulatory Visit: Payer: Commercial Managed Care - PPO

## 2020-07-12 ENCOUNTER — Ambulatory Visit: Payer: Commercial Managed Care - PPO | Admitting: Endocrinology

## 2020-07-18 ENCOUNTER — Other Ambulatory Visit: Payer: Self-pay | Admitting: Endocrinology

## 2020-07-25 ENCOUNTER — Other Ambulatory Visit: Payer: Self-pay | Admitting: Endocrinology

## 2020-08-26 ENCOUNTER — Other Ambulatory Visit: Payer: Self-pay

## 2020-08-26 ENCOUNTER — Other Ambulatory Visit (INDEPENDENT_AMBULATORY_CARE_PROVIDER_SITE_OTHER): Payer: Commercial Managed Care - PPO

## 2020-08-26 DIAGNOSIS — Z794 Long term (current) use of insulin: Secondary | ICD-10-CM

## 2020-08-26 DIAGNOSIS — E1165 Type 2 diabetes mellitus with hyperglycemia: Secondary | ICD-10-CM | POA: Diagnosis not present

## 2020-08-26 LAB — COMPREHENSIVE METABOLIC PANEL
ALT: 56 U/L — ABNORMAL HIGH (ref 0–35)
AST: 56 U/L — ABNORMAL HIGH (ref 0–37)
Albumin: 4.4 g/dL (ref 3.5–5.2)
Alkaline Phosphatase: 81 U/L (ref 39–117)
BUN: 15 mg/dL (ref 6–23)
CO2: 30 mEq/L (ref 19–32)
Calcium: 10.1 mg/dL (ref 8.4–10.5)
Chloride: 100 mEq/L (ref 96–112)
Creatinine, Ser: 0.69 mg/dL (ref 0.40–1.20)
GFR: 98.15 mL/min (ref >60.00)
Glucose, Bld: 187 mg/dL — ABNORMAL HIGH (ref 70–99)
Potassium: 4 mEq/L (ref 3.5–5.1)
Sodium: 136 mEq/L (ref 135–145)
Total Bilirubin: 0.4 mg/dL (ref 0.2–1.2)
Total Protein: 8.1 g/dL (ref 6.0–8.3)

## 2020-08-27 LAB — FRUCTOSAMINE: Fructosamine: 324 umol/L — ABNORMAL HIGH (ref 0–285)

## 2020-08-30 ENCOUNTER — Other Ambulatory Visit: Payer: Self-pay | Admitting: *Deleted

## 2020-08-30 ENCOUNTER — Other Ambulatory Visit: Payer: Self-pay

## 2020-08-30 ENCOUNTER — Encounter: Payer: Self-pay | Admitting: Endocrinology

## 2020-08-30 ENCOUNTER — Ambulatory Visit (INDEPENDENT_AMBULATORY_CARE_PROVIDER_SITE_OTHER): Payer: Commercial Managed Care - PPO | Admitting: Endocrinology

## 2020-08-30 VITALS — BP 132/70 | HR 93 | Ht 69.0 in | Wt 268.4 lb

## 2020-08-30 DIAGNOSIS — E782 Mixed hyperlipidemia: Secondary | ICD-10-CM | POA: Diagnosis not present

## 2020-08-30 DIAGNOSIS — R945 Abnormal results of liver function studies: Secondary | ICD-10-CM

## 2020-08-30 DIAGNOSIS — Z794 Long term (current) use of insulin: Secondary | ICD-10-CM

## 2020-08-30 DIAGNOSIS — E1165 Type 2 diabetes mellitus with hyperglycemia: Secondary | ICD-10-CM

## 2020-08-30 DIAGNOSIS — Z23 Encounter for immunization: Secondary | ICD-10-CM | POA: Diagnosis not present

## 2020-08-30 LAB — POCT GLYCOSYLATED HEMOGLOBIN (HGB A1C): Hemoglobin A1C: 8.6 % — AB (ref 4.0–5.6)

## 2020-08-30 MED ORDER — TRULICITY 3 MG/0.5ML ~~LOC~~ SOAJ
3.0000 mg | SUBCUTANEOUS | 1 refills | Status: DC
Start: 2020-08-30 — End: 2020-12-02

## 2020-08-30 NOTE — Patient Instructions (Signed)
Humalog 28-36 units based on meal size or Carbs

## 2020-08-30 NOTE — Progress Notes (Signed)
Patient ID: Pamela Shannon, female   DOB: [birth year], 55 y.o.   MRN: [id number]           Reason for Appointment: Endocrinology follow-up   History of Present Illness:          Diagnosis: Type 2 diabetes mellitus, date of diagnosis: ?  2011       Past history:  At diagnosis she was having symptoms of marked fatigue, dry mouth but not clear how high her sugars were. She has been on various medications for treating her diabetes, probably metformin to start with. Also around the time of diagnosis she thinks she weighed about 300 pounds. She thinks she was able to lose weight subsequently and about 2 years ago had lost down to nearly 200 pounds All the details are not available of her previous management but she appears to have had progressively worsening requiring multiple agents and additional therapies. She thinks her A1c has been over 9% for about 2 years or more, lab records are not available She probably had been on metformin along with Tanzeum about a year or so ago. Because of inadequate control she was also given Invokamet at some point.  In 9/15 she was switched from Tanzeum to [place] and started on bedtime Levemir insulin, 20 units  She had tried on Ozempic and Trulicity, both stopped because of cost  Recent history:  INSULIN regimen: Toujeo 130 units, Humalog 20 units acb, 20 acl and 22, units before dinner  Non- hypoglycemic drugs the patient is taking are: Metformin 500 mg a.m., 1000 mg p.m., Farxiga 10 mg daily, Trulicity 1.5  Her G6K is now 8.6   Current management, blood sugar patterns and problems identified:   She is using her freestyle libre once a month  Despite increasing her Toujeo 230 units she is still having overall high readings including overnight most of the time  Currently only 15% of her readings are in target range  Apparently she has difficulty receiving phone calls at work and not clear if she is approved for the OmniPod pump that was  discussed  She is still not keen on doing the pump  She was told to double up on her Trulicity 1.5 and then call for prescription for 1.3 but she did not and has not taken Trulicity for a while  She does not do much exercise because she thinks she is too busy at work  Has gained 8 pounds  CGM interpretation as follows  Data available  Blood sugars are above target range throughout the day and night  HIGHEST blood sugars on average are after 10 PM but also over 250 midday  Blood sugars are the lowest around 4-6 AM before breakfast recently the lowest 144  No consistent pattern but blood sugars appear to be going up significantly after 8 PM  Postprandial reading may be sporadically higher elevated and frequently going over 300  Overnight blood sugars generally over 180 throughout the night  CGM use % of time  56  2-week average/GV  243/24  Time in range     13   %  % Time Above 180  430  % Time above 250  44  % Time Below 70      PRE-MEAL Fasting Lunch Dinner Bedtime Overall  Glucose range:       Averages:  210  257  223 278    POST-MEAL PC Breakfast PC Lunch PC Dinner  Glucose range:     Averages:  251  235  246      Previous data:   CGM use % of time  95  Average and SD  196, GV 23  Time in range    39    %  % Time Above 180  50  % Time above 250  11  % Time Below target  0   Side effects from medications have been: None      Self-care: The diet that the patient has been following is: Variable    Meals: 3 meals per day, evening meal at 5 PM. Breakfast is protein shake, toast or oatmeal, sometimes cereal.    Has snacks with peanut butter crackers            Dietician visit, most recent: 04/21/2020               Weight history: Previous range 200-260  Wt Readings from Last 3 Encounters:  08/30/20 268 lb 6.4 oz (121.7 kg)  05/06/20 260 lb 9.6 oz (118.2 kg)  01/28/20 256 lb (116.1 kg)      Lab Results  Component Value Date   HGBA1C 8.6 (A)  08/30/2020   HGBA1C 8.3 (H) 04/29/2020   HGBA1C 7.7 (A) 01/28/2020   Lab Results  Component Value Date   MICROALBUR 17.9 (H) 01/09/2020   LDLCALC 89 01/31/2016   CREATININE 0.69 08/26/2020    Office Visit on 08/30/2020  Component Date Value Ref Range Status  . Hemoglobin A1C 08/30/2020 8.6* 4.0 - 5.6 % Final  Lab on 08/26/2020  Component Date Value Ref Range Status  . Fructosamine 08/26/2020 324* 0 - 285 umol/L Final   Comment: Published reference interval for apparently healthy subjects between age 99 and 103 is 55 - 285 umol/L and in a poorly controlled diabetic population is 228 - 563 umol/L with a mean of 396 umol/L.   [name] Sodium 08/26/2020 136  135 - 145 mEq/L Final  . Potassium 08/26/2020 4.0  3.5 - 5.1 mEq/L Final  . Chloride 08/26/2020 100  96 - 112 mEq/L Final  . CO2 08/26/2020 30  19 - 32 mEq/L Final  . Glucose, Bld 08/26/2020 187* 70 - 99 mg/dL Final  . BUN 08/26/2020 15  6 - 23 mg/dL Final  . Creatinine, Ser 08/26/2020 0.69  0.40 - 1.20 mg/dL Final  . Total Bilirubin 08/26/2020 0.4  0.2 - 1.2 mg/dL Final  . Alkaline Phosphatase 08/26/2020 81  39 - 117 U/L Final  . AST 08/26/2020 56* 0 - 37 U/L Final  . ALT 08/26/2020 56* 0 - 35 U/L Final  . Total Protein 08/26/2020 8.1  6.0 - 8.3 g/dL Final  . Albumin 08/26/2020 4.4  3.5 - 5.2 g/dL Final  . GFR 08/26/2020 98.15  >60.00 mL/min Final   Calculated using the CKD-EPI Creatinine Equation (2021)  . Calcium 08/26/2020 10.1  8.4 - 10.5 mg/dL Final      Allergies as of 08/30/2020   No Known Allergies     Medication List       Accurate as of August 30, 2020  4:20 PM. If you have any questions, ask your nurse or doctor.        buPROPion 150 MG 24 hr tablet Commonly known as: WELLBUTRIN XL TAKE 1 TABLET BY MOUTH  TWICE DAILY   Farxiga 10 MG Tabs tablet Generic drug: dapagliflozin propanediol TAKE 1 TABLET BY MOUTH ONCE DAILY BEFORE BREAKFAST   fenofibrate 145 MG tablet Commonly known as: TRICOR TAKE 1  TABLET  BY MOUTH  DAILY   FreeStyle Libre 2 Sensor Misc 2 Devices by Does not apply route every 14 (fourteen) days.   gabapentin 300 MG capsule Commonly known as: NEURONTIN Take 1 capsule (300 mg total) by mouth at bedtime. What changed:   when to take this  reasons to take this   HumaLOG KwikPen 100 UNIT/ML KwikPen Generic drug: insulin lispro INJECT 12 UNITS  SUBCUTANEOUSLY BEFORE  BREAKFAST AND LUNCH, AND 22 UNITS BEFORE DINNER ; MAY  ADJUST AS NEEDED What changed: See the new instructions.   levonorgestrel 20 MCG/24HR IUD Commonly known as: MIRENA 1 each by Intrauterine route once.   metFORMIN 1000 MG tablet Commonly known as: GLUCOPHAGE Take 1,000 mg by mouth daily.   metFORMIN 500 MG 24 hr tablet Commonly known as: GLUCOPHAGE-XR TAKE 4 TABLETS BY MOUTH  ONCE DAILY WITH SUPPER   MULTIVITAMIN ADULT PO Take 1 tablet by mouth daily.   OneTouch Delica Lancets 56L Misc 1 each by Does not apply route 3 (three) times daily. E11.9   OneTouch Verio Flex System w/Device Kit 1 each by Does not apply route 3 (three) times daily. E11.9   OneTouch Verio test strip Generic drug: glucose blood 1 each by Other route 3 (three) times daily. E11.9   Toujeo Max SoloStar 300 UNIT/ML Solostar Pen Generic drug: insulin glargine (2 Unit Dial) INJECT SUBCUTANEOUSLY 110  UNITS DAILY   Trulicity 1.5 SL/3.7DS Sopn Generic drug: Dulaglutide INJECT 1.5 MG INTO THE SKIN WEEKLY What changed: Another medication with the same name was changed. Make sure you understand how and when to take each. Changed by: Tora Duck, CMA   Trulicity 3 KA/7.6OT Sopn Generic drug: Dulaglutide Inject 3 mg into the skin once a week. What changed:   how much to take  when to take this Changed by: PEDRAZA, RHONDA, CMA   vitamin C 1000 MG tablet Take 1,000 mg by mouth daily.       Allergies: No Known Allergies  Past Medical History:  Diagnosis Date  . Back pain   . Diabetes mellitus      No past surgical history on file.  Family History  Problem Relation Age of Onset  . Diabetes Father   . Diabetes Mother   . Fibromyalgia Mother   . Heart disease Maternal Grandmother   . Gout Maternal Grandmother   . Heart attack Maternal Grandfather     Social History:  reports that she has quit smoking. She has never used smokeless tobacco. She reports that she does not drink alcohol and does not use drugs.    Review of Systems    She on Wellbutrin 300 mg SR prescribed for improving her appetite and mood         Lipids: LDL below 100, not on statin treatment, triglycerides are usually high      Lab Results  Component Value Date   CHOL 169 04/29/2020   HDL 32.70 (L) 04/29/2020   LDLCALC 89 01/31/2016   LDLDIRECT 98.0 04/29/2020   TRIG 310.0 (H) 04/29/2020   CHOLHDL 5 04/29/2020               Had persistently abnormal functions  Has not seen PCP regarding this  Lab Results  Component Value Date   ALT 56 (H) 08/26/2020   ALT 63 (H) 04/29/2020   ALT 58 (H) 01/09/2020        She has periodic numbness and burning in feet.   Last foot exam 10/2018  She takes  gabapentin as needed for restless legs at night  Last eye exam 2017, reportedly had background retinopathy Has not followed up on this and cannot afford the co-pay   No problems with high blood pressure:  BP Readings from Last 3 Encounters:  08/30/20 132/70  05/06/20 108/72  01/28/20 126/80     Physical Examination:  BP 132/70   Pulse 93   Ht '5\' 9"'  (1.753 m)   Wt 268 lb 6.4 oz (121.7 kg)   SpO2 93%   BMI 39.64 kg/m    ASSESSMENT/PLAN:  Diabetes type 2, insulin requiring with obesity  See history of present illness for detailed discussion of current diabetes management, blood sugar patterns and problems identified   A1c is 8.6  Her blood sugars are higher than before and she is still highly insulin resistant with taking 130 units of basal insulin along with at least 60 units of  mealtime insulin daily Not clear if she was approved for the insulin pump as she has not been able to receive calls from the company Also she did not call for a 3 mg dose of Trulicity which she has not taken for a while, this may be the likely reason why she is getting poor control again Likely needs more mealtime insulin since fasting readings are not consistently high Also diet appears to be unbalanced  Abnormal liver functions: This is persistent, likely fatty liver, she needs to discuss with PCP and consider further evaluation She does need to make a follow-up with her PCP  He has been refusing the influenza vaccine but emphasized the need to do this because of current incidence, has taken this before However has refused COVID vaccines   Plan:   She will get back on Trulicity  Samples given for 1.5 mg and she can try this for 2 weeks and then go to 3 mg which was prescribed  Discussed importance of watching her diet  She needs to have balanced meals instead of just popcorn in the evening  Will still need to consider insulin pump otherwise we can try Humulin R U-500 when she is ready to get a refill on Humalog  For getting better postprandial control sugars reminded of the need to increase her mealtime dose up to 36 units based on meal size, this may be further adjusted if blood sugars start coming down with high-dose Trulicity  More regular follow-up  Needs to be doing more walking for exercise     Patient Instructions  Humalog 28-36 units based on meal size or Carbs        Elayne Snare 08/30/2020, 4:20 PM   Note: This office note was prepared with Dragon voice rec ognition system technology. Any transcriptional errors that result from this process are unintentional.

## 2020-09-12 ENCOUNTER — Other Ambulatory Visit: Payer: Self-pay | Admitting: Endocrinology

## 2020-09-24 ENCOUNTER — Telehealth: Payer: Self-pay | Admitting: Family Medicine

## 2020-09-24 NOTE — Telephone Encounter (Signed)
Patient is wanting to TOC. She said that our office was closer to her home. Please advise.

## 2020-09-24 NOTE — Telephone Encounter (Signed)
I would accept but note that she has not seen PCP since 2020. With her health conditions I would definitely recommend for her to be seen more often for care so if she agrees to regular care fine.

## 2020-09-24 NOTE — Telephone Encounter (Signed)
Ok with me 

## 2020-09-29 ENCOUNTER — Other Ambulatory Visit: Payer: Self-pay | Admitting: Endocrinology

## 2020-10-06 ENCOUNTER — Other Ambulatory Visit: Payer: Self-pay | Admitting: Endocrinology

## 2020-10-07 ENCOUNTER — Ambulatory Visit: Payer: Commercial Managed Care - PPO | Admitting: Family Medicine

## 2020-10-07 NOTE — Progress Notes (Deleted)
Pamela Shannon is a 55 y.o. female  No chief complaint on file.   HPI: Pamela Shannon is a 55 y.o. female who complains of      Of note, pt has a PMHx significant for uncontrolled DM. Component     Latest Ref Rng & Units 01/27/2019 05/05/2019 08/01/2019 11/17/2019  Hemoglobin A1C     4.0 - 5.6 % 10.8 (H) 10.1 (H) 8.3 (H) 7.7 (H)   Component     Latest Ref Rng & Units 01/28/2020 04/29/2020 08/30/2020  Hemoglobin A1C     4.0 - 5.6 % 7.7 (A) 8.3 (H) 8.6 (A)    Past Medical History:  Diagnosis Date  . Back pain   . Diabetes mellitus     No past surgical history on file.  Social History   Socioeconomic History  . Marital status: Single    Spouse name: Not on file  . Number of children: 3  . Years of education: 53  . Highest education level: Not on file  Occupational History  . Occupation: Geophysicist/field seismologist  Tobacco Use  . Smoking status: Former Research scientist (life sciences)  . Smokeless tobacco: Never Used  Vaping Use  . Vaping Use: Never used  Substance and Sexual Activity  . Alcohol use: No  . Drug use: No  . Sexual activity: Yes    Birth control/protection: I.U.D.  Other Topics Concern  . Not on file  Social History Narrative   Fun/Hobby: Go to church.    Denies abuse and feels safe at home.    Social Determinants of Health   Financial Resource Strain: Not on file  Food Insecurity: Not on file  Transportation Needs: Not on file  Physical Activity: Not on file  Stress: Not on file  Social Connections: Not on file  Intimate Partner Violence: Not on file    Family History  Problem Relation Age of Onset  . Diabetes Father   . Diabetes Mother   . Fibromyalgia Mother   . Heart disease Maternal Grandmother   . Gout Maternal Grandmother   . Heart attack Maternal Grandfather      Immunization History  Administered Date(s) Administered  . Influenza,inj,Quad PF,6+ Mos 07/12/2015, 05/24/2016, 06/15/2017, 05/12/2019, 08/30/2020    Outpatient Encounter Medications as of 10/07/2020  Medication  Sig  . Ascorbic Acid (VITAMIN C) 1000 MG tablet Take 1,000 mg by mouth daily.  . Blood Glucose Monitoring Suppl (ONETOUCH VERIO FLEX SYSTEM) w/Device KIT 1 each by Does not apply route 3 (three) times daily. E11.9  . buPROPion (WELLBUTRIN XL) 150 MG 24 hr tablet TAKE 1 TABLET BY MOUTH  TWICE DAILY  . Continuous Blood Gluc Sensor (FREESTYLE LIBRE 2 SENSOR) MISC APPLY EVERY 14 DAYS  . Dulaglutide (TRULICITY) 3 DU/2.0UR SOPN Inject 3 mg into the skin once a week.  [name] FARXIGA 10 MG TABS tablet TAKE 1 TABLET BY MOUTH ONCE DAILY BEFORE BREAKFAST  . fenofibrate (TRICOR) 145 MG tablet TAKE 1 TABLET BY MOUTH  DAILY  . gabapentin (NEURONTIN) 300 MG capsule TAKE 1 CAPSULE BY MOUTH AT  BEDTIME  . glucose blood (ONETOUCH VERIO) test strip 1 each by Other route 3 (three) times daily. E11.9  . HUMALOG KWIKPEN 100 UNIT/ML KwikPen INJECT 12 UNITS  SUBCUTANEOUSLY BEFORE  BREAKFAST AND LUNCH, AND 22 UNITS BEFORE DINNER ; MAY  ADJUST AS NEEDED (Patient taking differently: Takes 20 units before breakfast and lunch and 22 units before dinner)  . levonorgestrel (MIRENA) 20 MCG/24HR IUD 1 each by Intrauterine route once.  [name]  metFORMIN (GLUCOPHAGE) 1000 MG tablet Take 1,000 mg by mouth daily.  . metFORMIN (GLUCOPHAGE-XR) 500 MG 24 hr tablet TAKE 4 TABLETS BY MOUTH  ONCE DAILY WITH SUPPER  . Multiple Vitamins-Minerals (MULTIVITAMIN ADULT PO) Take 1 tablet by mouth daily.   Glory Rosebush Delica Lancets 37F MISC 1 each by Does not apply route 3 (three) times daily. E11.9  . TOUJEO MAX SOLOSTAR 300 UNIT/ML Solostar Pen INJECT SUBCUTANEOUSLY 110  UNITS DAILY  . TRULICITY 1.5 JK/9.4OC SOPN INJECT 1.5 MG INTO THE SKIN WEEKLY (Patient not taking: Reported on 08/30/2020)   No facility-administered encounter medications on file as of 10/07/2020.     ROS: Pertinent positives and negatives noted in HPI. Remainder of ROS non-contributory   No Known Allergies  There were no vitals taken for this visit.  Wt Readings from Last 3  Encounters:  08/30/20 268 lb 6.4 oz (121.7 kg)  05/06/20 260 lb 9.6 oz (118.2 kg)  01/28/20 256 lb (116.1 kg)   Temp Readings from Last 3 Encounters:  10/27/19 98.1 F (36.7 C) (Oral)  08/17/19 98.2 F (36.8 C) (Oral)  06/06/19 98 F (36.7 C) (Oral)   BP Readings from Last 3 Encounters:  08/30/20 132/70  05/06/20 108/72  01/28/20 126/80   Pulse Readings from Last 3 Encounters:  08/30/20 93  05/06/20 (!) 107  01/28/20 100     Physical Exam   A/P:     This visit occurred during the SARS-CoV-2 public health emergency.  Safety protocols were in place, including screening questions prior to the visit, additional usage of staff PPE, and extensive cleaning of exam room while observing appropriate contact time as indicated for disinfecting solutions.

## 2020-10-11 ENCOUNTER — Other Ambulatory Visit: Payer: Self-pay

## 2020-10-11 ENCOUNTER — Other Ambulatory Visit (INDEPENDENT_AMBULATORY_CARE_PROVIDER_SITE_OTHER): Payer: Commercial Managed Care - PPO

## 2020-10-11 DIAGNOSIS — Z794 Long term (current) use of insulin: Secondary | ICD-10-CM | POA: Diagnosis not present

## 2020-10-11 DIAGNOSIS — E1165 Type 2 diabetes mellitus with hyperglycemia: Secondary | ICD-10-CM | POA: Diagnosis not present

## 2020-10-11 DIAGNOSIS — E782 Mixed hyperlipidemia: Secondary | ICD-10-CM | POA: Diagnosis not present

## 2020-10-11 LAB — LIPID PANEL
Cholesterol: 178 mg/dL (ref 0–200)
HDL: 34.5 mg/dL — ABNORMAL LOW (ref >39.00)
NonHDL: 143.31
Total CHOL/HDL Ratio: 5
Triglycerides: 257 mg/dL — ABNORMAL HIGH (ref 0.0–149.0)
VLDL: 51.4 mg/dL — ABNORMAL HIGH (ref 0.0–40.0)

## 2020-10-11 LAB — LDL CHOLESTEROL, DIRECT: Direct LDL: 110 mg/dL

## 2020-10-11 LAB — COMPREHENSIVE METABOLIC PANEL
ALT: 45 U/L — ABNORMAL HIGH (ref 0–35)
AST: 41 U/L — ABNORMAL HIGH (ref 0–37)
Albumin: 4.1 g/dL (ref 3.5–5.2)
Alkaline Phosphatase: 75 U/L (ref 39–117)
BUN: 13 mg/dL (ref 6–23)
CO2: 28 mEq/L (ref 19–32)
Calcium: 9.7 mg/dL (ref 8.4–10.5)
Chloride: 100 mEq/L (ref 96–112)
Creatinine, Ser: 0.64 mg/dL (ref 0.40–1.20)
GFR: 99.86 mL/min (ref >60.00)
Glucose, Bld: 210 mg/dL — ABNORMAL HIGH (ref 70–99)
Potassium: 4.1 mEq/L (ref 3.5–5.1)
Sodium: 136 mEq/L (ref 135–145)
Total Bilirubin: 0.4 mg/dL (ref 0.2–1.2)
Total Protein: 7.9 g/dL (ref 6.0–8.3)

## 2020-10-12 LAB — FRUCTOSAMINE: Fructosamine: 291 umol/L — ABNORMAL HIGH (ref 0–285)

## 2020-10-14 ENCOUNTER — Encounter: Payer: Self-pay | Admitting: Endocrinology

## 2020-10-14 ENCOUNTER — Other Ambulatory Visit: Payer: Self-pay

## 2020-10-14 ENCOUNTER — Ambulatory Visit (INDEPENDENT_AMBULATORY_CARE_PROVIDER_SITE_OTHER): Payer: Commercial Managed Care - PPO | Admitting: Endocrinology

## 2020-10-14 VITALS — BP 152/80 | HR 101 | Ht 69.0 in | Wt 273.0 lb

## 2020-10-14 DIAGNOSIS — E782 Mixed hyperlipidemia: Secondary | ICD-10-CM

## 2020-10-14 DIAGNOSIS — Z794 Long term (current) use of insulin: Secondary | ICD-10-CM | POA: Diagnosis not present

## 2020-10-14 DIAGNOSIS — E1165 Type 2 diabetes mellitus with hyperglycemia: Secondary | ICD-10-CM

## 2020-10-14 MED ORDER — HUMULIN R U-500 KWIKPEN 500 UNIT/ML ~~LOC~~ SOPN: PEN_INJECTOR | SUBCUTANEOUS | 1 refills | Status: DC

## 2020-10-14 NOTE — Patient Instructions (Signed)
  Humulin R 30 min before meals 28-36 units based on meal size or Carbs  Reduce Toujeo to 120 and keep and am sugar <140

## 2020-10-14 NOTE — Progress Notes (Signed)
Patient ID: Pamela Shannon, female   DOB: July 14, 1966, 55 y.o.   MRN: [id number]           Reason for Appointment: Endocrinology follow-up   History of Present Illness:          Diagnosis: Type 2 diabetes mellitus, date of diagnosis: ?  2011       Past history:  At diagnosis she was having symptoms of marked fatigue, dry mouth but not clear how high her sugars were. She has been on various medications for treating her diabetes, probably metformin to start with. Also around the time of diagnosis she thinks she weighed about 300 pounds. She thinks she was able to lose weight subsequently and about 2 years ago had lost down to nearly 200 pounds All the details are not available of her previous management but she appears to have had progressively worsening requiring multiple agents and additional therapies. She thinks her A1c has been over 9% for about 2 years or more, lab records are not available She probably had been on metformin along with Tanzeum about a year or so ago. Because of inadequate control she was also given Invokamet at some point.  In 9/15 she was switched from Tanzeum to [place] and started on bedtime Levemir insulin, 20 units  She had tried on Ozempic and Trulicity, both stopped because of cost  Recent history:  INSULIN regimen: Toujeo 14 0 units, Humalog 20 units acb, 20 acl and 22, units before dinner  Non- hypoglycemic drugs the patient is taking are: Metformin 500 mg a.m., 1000 mg p.m., Farxiga 10 mg daily,    Her A1c is most recently 8.6 and fructosamine is now 291  Current management, blood sugar patterns and problems identified:   She is using her freestyle libre but did not bring her reader today  Because of cost of Humalog she has been scaling back on her doses  Also even though she was told to take 28-36 units and given written instructions she is only taking 22-24 units at mealtimes mostly  Also ran out of Trulicity and cannot afford it  She has gone up  10 units on her TOUJEO but she thinks her fasting readings are still likely averaging about 180  Lab fasting glucose was 210  She is currently taking [place] but she does not know if she will be able to afford it with her high deductible  She does not do much exercise because she thinks she is too busy at work  Has gained another 5 pounds   CGM data not available  Previous data:  Overnight blood sugars generally over 180 throughout the night  CGM use % of time  56  2-week average/GV  243/24  Time in range     13   %  % Time Above 180  430  % Time above 250  44  % Time Below 70      PRE-MEAL Fasting Lunch Dinner Bedtime Overall  Glucose range:       Averages:  210  257  223 278    POST-MEAL PC Breakfast PC Lunch PC Dinner  Glucose range:     Averages:  251  235  246    Side effects from medications have been: None      Self-care: The diet that the patient has been following is: Variable    Meals: 3 meals per day, evening meal at 5 PM. Breakfast is protein shake, toast or oatmeal, sometimes cereal.  Has snacks with peanut butter crackers            Dietician visit, most recent: 04/21/2020               Weight history: Previous range 200-260  Wt Readings from Last 3 Encounters:  10/14/20 273 lb (123.8 kg)  08/30/20 268 lb 6.4 oz (121.7 kg)  05/06/20 260 lb 9.6 oz (118.2 kg)      Lab Results  Component Value Date   HGBA1C 8.6 (A) 08/30/2020   HGBA1C 8.3 (H) 04/29/2020   HGBA1C 7.7 (A) 01/28/2020   Lab Results  Component Value Date   MICROALBUR 17.9 (H) 01/09/2020   LDLCALC 89 01/31/2016   CREATININE 0.64 10/11/2020    Lab on 10/11/2020  Component Date Value Ref Range Status  . Cholesterol 10/11/2020 178  0 - 200 mg/dL Final   ATP III Classification       Desirable:  < 200 mg/dL               Borderline High:  200 - 239 mg/dL          High:  > = 240 mg/dL  . Triglycerides 10/11/2020 257.0* 0.0 - 149.0 mg/dL Final   Normal:  <150 mg/dLBorderline  High:  150 - 199 mg/dL  . HDL 10/11/2020 34.50* >39.00 mg/dL Final  . VLDL 10/11/2020 51.4* 0.0 - 40.0 mg/dL Final  . Total CHOL/HDL Ratio 10/11/2020 5   Final                  Men          Women1/2 Average Risk     3.4          3.3Average Risk          5.0          4.42X Average Risk          9.6          7.13X Average Risk          15.0          11.0                      . NonHDL 10/11/2020 143.31   Final   NOTE:  Non-HDL goal should be 30 mg/dL higher than patient's LDL goal (i.e. LDL goal of < 70 mg/dL, would have non-HDL goal of < 100 mg/dL)  . Sodium 10/11/2020 136  135 - 145 mEq/L Final  . Potassium 10/11/2020 4.1  3.5 - 5.1 mEq/L Final  . Chloride 10/11/2020 100  96 - 112 mEq/L Final  . CO2 10/11/2020 28  19 - 32 mEq/L Final  . Glucose, Bld 10/11/2020 210* 70 - 99 mg/dL Final  . BUN 10/11/2020 13  6 - 23 mg/dL Final  . Creatinine, Ser 10/11/2020 0.64  0.40 - 1.20 mg/dL Final  . Total Bilirubin 10/11/2020 0.4  0.2 - 1.2 mg/dL Final  . Alkaline Phosphatase 10/11/2020 75  39 - 117 U/L Final  . AST 10/11/2020 41* 0 - 37 U/L Final  . ALT 10/11/2020 45* 0 - 35 U/L Final  . Total Protein 10/11/2020 7.9  6.0 - 8.3 g/dL Final  . Albumin 10/11/2020 4.1  3.5 - 5.2 g/dL Final  . GFR 10/11/2020 99.86  >60.00 mL/min Final   Calculated using the CKD-EPI Creatinine Equation (2021)  . Calcium 10/11/2020 9.7  8.4 - 10.5 mg/dL Final  . Fructosamine 10/11/2020 291* 0 - 285  umol/L Final   Comment: Published reference interval for apparently healthy subjects between age 65 and 72 is 101 - 285 umol/L and in a poorly controlled diabetic population is 228 - 563 umol/L with a mean of 396 umol/L.   [name] Direct LDL 10/11/2020 110.0  mg/dL Final   Optimal:  <100 mg/dLNear or Above Optimal:  100-129 mg/dLBorderline High:  130-159 mg/dLHigh:  160-189 mg/dLVery High:  >190 mg/dL      Allergies as of 10/14/2020   No Known Allergies     Medication List       Accurate as of October 14, 2020  3:23 PM. If  you have any questions, ask your nurse or doctor.        STOP taking these medications   HumaLOG KwikPen 100 UNIT/ML KwikPen Generic drug: insulin lispro Stopped by: Elayne Snare, MD     TAKE these medications   buPROPion 150 MG 24 hr tablet Commonly known as: WELLBUTRIN XL TAKE 1 TABLET BY MOUTH  TWICE DAILY   Farxiga 10 MG Tabs tablet Generic drug: dapagliflozin propanediol TAKE 1 TABLET BY MOUTH ONCE DAILY BEFORE BREAKFAST   fenofibrate 145 MG tablet Commonly known as: TRICOR TAKE 1 TABLET BY MOUTH  DAILY   FreeStyle Libre 2 Sensor Misc APPLY EVERY 14 DAYS   gabapentin 300 MG capsule Commonly known as: NEURONTIN TAKE 1 CAPSULE BY MOUTH AT  BEDTIME   HumuLIN R U-500 KwikPen 500 UNIT/ML kwikpen Generic drug: insulin regular human CONCENTRATED 60 Units, 30 minutes before each meal Started by: Elayne Snare, MD   levonorgestrel 20 MCG/24HR IUD Commonly known as: MIRENA 1 each by Intrauterine route once.   metFORMIN 1000 MG tablet Commonly known as: GLUCOPHAGE Take 1,000 mg by mouth daily.   metFORMIN 500 MG 24 hr tablet Commonly known as: GLUCOPHAGE-XR TAKE 4 TABLETS BY MOUTH  ONCE DAILY WITH SUPPER   MULTIVITAMIN ADULT PO Take 1 tablet by mouth daily.   OneTouch Delica Lancets 89Q Misc 1 each by Does not apply route 3 (three) times daily. E11.9   OneTouch Verio Flex System w/Device Kit 1 each by Does not apply route 3 (three) times daily. E11.9   OneTouch Verio test strip Generic drug: glucose blood 1 each by Other route 3 (three) times daily. E11.9   Toujeo Max SoloStar 300 UNIT/ML Solostar Pen Generic drug: insulin glargine (2 Unit Dial) INJECT SUBCUTANEOUSLY 110  UNITS DAILY   Trulicity 1.5 JJ/9.4RD Sopn Generic drug: Dulaglutide INJECT 1.5 MG INTO THE SKIN WEEKLY   Trulicity 3 EY/8.1KG Sopn Generic drug: Dulaglutide Inject 3 mg into the skin once a week.   vitamin C 1000 MG tablet Take 1,000 mg by mouth daily.       Allergies: No Known  Allergies  Past Medical History:  Diagnosis Date  . Back pain   . Diabetes mellitus     No past surgical history on file.  Family History  Problem Relation Age of Onset  . Diabetes Father   . Diabetes Mother   . Fibromyalgia Mother   . Heart disease Maternal Grandmother   . Gout Maternal Grandmother   . Heart attack Maternal Grandfather     Social History:  reports that she has quit smoking. She has never used smokeless tobacco. She reports that she does not drink alcohol and does not use drugs.    Review of Systems    She on Wellbutrin 300 mg SR prescribed for improving her appetite and mood  Lipids: Not well controlled including triglycerides, currently taking only fenofibrate      Lab Results  Component Value Date   CHOL 178 10/11/2020   HDL 34.50 (L) 10/11/2020   LDLCALC 89 01/31/2016   LDLDIRECT 110.0 10/11/2020   TRIG 257.0 (H) 10/11/2020   CHOLHDL 5 10/11/2020               Had persistently abnormal liver functions, possibly from fatty liver  Lab Results  Component Value Date   ALT 45 (H) 10/11/2020   ALT 56 (H) 08/26/2020   ALT 63 (H) 04/29/2020        She has periodic numbness and burning in feet.    She takes gabapentin as needed for restless legs at night  Last eye exam 2017, reportedly had background retinopathy Has not followed up on this and cannot afford the co-pay   Blood pressure appears to be fluctuating  BP Readings from Last 3 Encounters:  10/14/20 (!) 152/80  08/30/20 132/70  05/06/20 108/72     Physical Examination:  BP (!) 152/80   Pulse (!) 101   Ht _0  (1.753 m)   Wt 273 lb (123.8 kg)   SpO2 95%   BMI 40.32 kg/m    ASSESSMENT/PLAN:  Diabetes type 2, insulin requiring with obesity  See history of present illness for detailed discussion of current diabetes management, blood sugar patterns and problems identified   A1c is last 8.6  She did not bring her meter for download today, using freestyle libre  intermittently  She has now difficulty affording her diabetes medications because of her high deductible Because of not getting adequate doses of Humalog her blood sugars are still relatively high but not clear what her blood sugar patterns are She still reports mostly higher fasting readings despite taking 140 units of basal insulin Currently not on Trulicity but is still on Farxiga She is continuing to gain weight and appears to be not doing as well with not taking Trulicity  Increased LDL: Likely needs to be on a statin   Plan:   She will be given the patient assistance form for Trulicity  Also given her the $35 co-pay card for starting HUMULIN R U-500 in place of Humalog  Discussed how this is different from U-100 insulin and timing and action of the doses  For now she will take 28-36 units based on her meal size  To reduce Toujeo by 20 units with starting the Humulin R  Continue [place]  Co-pay card given for free box of Toujeo  Regular walking for exercise  Consider consultation with dietitian but she needs to work on her weight loss  She will bring her freestyle reader on her next visit for review     Patient Instructions     Humulin R 30 min before meals 28-36 units based on meal size or Carbs  Reduce Toujeo to 120 and keep and am sugar <140         Elayne Snare 10/14/2020, 3:23 PM   Note: This office note was prepared with Dragon voice rec ognition system technology. Any transcriptional errors that result from this process are unintentional.

## 2020-10-22 ENCOUNTER — Other Ambulatory Visit: Payer: Commercial Managed Care - PPO

## 2020-10-25 ENCOUNTER — Ambulatory Visit: Payer: Commercial Managed Care - PPO | Admitting: Endocrinology

## 2020-11-23 ENCOUNTER — Telehealth: Payer: Self-pay | Admitting: *Deleted

## 2020-11-23 NOTE — Telephone Encounter (Signed)
LVM--to let pt know received the [employer] form. --left to Dr. Lucianne Muss desk waiting to be completed

## 2020-12-02 ENCOUNTER — Encounter: Payer: Self-pay | Admitting: Internal Medicine

## 2020-12-02 ENCOUNTER — Telehealth: Payer: Self-pay | Admitting: *Deleted

## 2020-12-02 ENCOUNTER — Ambulatory Visit (INDEPENDENT_AMBULATORY_CARE_PROVIDER_SITE_OTHER): Payer: Commercial Managed Care - PPO | Admitting: Internal Medicine

## 2020-12-02 ENCOUNTER — Other Ambulatory Visit: Payer: Self-pay

## 2020-12-02 VITALS — BP 132/74 | HR 96 | Temp 98.8°F | Resp 18 | Ht 69.0 in | Wt 276.0 lb

## 2020-12-02 DIAGNOSIS — Z0001 Encounter for general adult medical examination with abnormal findings: Secondary | ICD-10-CM | POA: Diagnosis not present

## 2020-12-02 DIAGNOSIS — E1169 Type 2 diabetes mellitus with other specified complication: Secondary | ICD-10-CM | POA: Diagnosis not present

## 2020-12-02 DIAGNOSIS — G2581 Restless legs syndrome: Secondary | ICD-10-CM

## 2020-12-02 DIAGNOSIS — Z23 Encounter for immunization: Secondary | ICD-10-CM

## 2020-12-02 DIAGNOSIS — R200 Anesthesia of skin: Secondary | ICD-10-CM

## 2020-12-02 DIAGNOSIS — E785 Hyperlipidemia, unspecified: Secondary | ICD-10-CM

## 2020-12-02 DIAGNOSIS — E1165 Type 2 diabetes mellitus with hyperglycemia: Secondary | ICD-10-CM | POA: Diagnosis not present

## 2020-12-02 DIAGNOSIS — R202 Paresthesia of skin: Secondary | ICD-10-CM

## 2020-12-02 LAB — VITAMIN D 25 HYDROXY (VIT D DEFICIENCY, FRACTURES): VITD: 22.72 ng/mL — ABNORMAL LOW (ref 30.00–100.00)

## 2020-12-02 LAB — TSH: TSH: 1.93 u[IU]/mL (ref 0.35–4.50)

## 2020-12-02 LAB — VITAMIN B12: Vitamin B-12: 427 pg/mL (ref 211–911)

## 2020-12-02 NOTE — Assessment & Plan Note (Signed)
Seeing endo for management. Foot exam done and referral to eye doctor as she has not had exam since 2017. Spent significant amount of time going over the importance of routine care and follow up to help prevent complications from her uncontrolled diabetes.

## 2020-12-02 NOTE — Assessment & Plan Note (Signed)
Taking gabapentin 300 mg QHS which seems to be helping. Will continue.

## 2020-12-02 NOTE — Patient Instructions (Addendum)
We are checking the labs today and have given you the pneumonia shot. We will get you back in with Dr. Emily Filbert for the eye exam as this is very important.    Health Maintenance, Female Adopting a healthy lifestyle and getting preventive care are important in promoting health and wellness. Ask your health care provider about:  The right schedule for you to have regular tests and exams.  Things you can do on your own to prevent diseases and keep yourself healthy. What should I know about diet, weight, and exercise? Eat a healthy diet  Eat a diet that includes plenty of vegetables, fruits, low-fat dairy products, and lean protein.  Do not eat a lot of foods that are high in solid fats, added sugars, or sodium.   Maintain a healthy weight Body mass index (BMI) is used to identify weight problems. It estimates body fat based on height and weight. Your health care provider can help determine your BMI and help you achieve or maintain a healthy weight. Get regular exercise Get regular exercise. This is one of the most important things you can do for your health. Most adults should:  Exercise for at least 150 minutes each week. The exercise should increase your heart rate and make you sweat (moderate-intensity exercise).  Do strengthening exercises at least twice a week. This is in addition to the moderate-intensity exercise.  Spend less time sitting. Even light physical activity can be beneficial. Watch cholesterol and blood lipids Have your blood tested for lipids and cholesterol at 55 years of age, then have this test every 5 years. Have your cholesterol levels checked more often if:  Your lipid or cholesterol levels are high.  You are older than 55 years of age.  You are at high risk for heart disease. What should I know about cancer screening? Depending on your health history and family history, you may need to have cancer screening at various ages. This may include screening  for:  Breast cancer.  Cervical cancer.  Colorectal cancer.  Skin cancer.  Lung cancer. What should I know about heart disease, diabetes, and high blood pressure? Blood pressure and heart disease  High blood pressure causes heart disease and increases the risk of stroke. This is more likely to develop in people who have high blood pressure readings, are of African descent, or are overweight.  Have your blood pressure checked: ? Every 3-5 years if you are 32-86 years of age. ? Every year if you are 81 years old or older. Diabetes Have regular diabetes screenings. This checks your fasting blood sugar level. Have the screening done:  Once every three years after age 23 if you are at a normal weight and have a low risk for diabetes.  More often and at a younger age if you are overweight or have a high risk for diabetes. What should I know about preventing infection? Hepatitis B If you have a higher risk for hepatitis B, you should be screened for this virus. Talk with your health care provider to find out if you are at risk for hepatitis B infection. Hepatitis C Testing is recommended for:  Everyone born from [birth year] through 1965.  Anyone with known risk factors for hepatitis C. Sexually transmitted infections (STIs)  Get screened for STIs, including gonorrhea and chlamydia, if: ? You are sexually active and are younger than 55 years of age. ? You are older than 55 years of age and your health care provider tells you that you  are at risk for this type of infection. ? Your sexual activity has changed since you were last screened, and you are at increased risk for chlamydia or gonorrhea. Ask your health care provider if you are at risk.  Ask your health care provider about whether you are at high risk for HIV. Your health care provider may recommend a prescription medicine to help prevent HIV infection. If you choose to take medicine to prevent HIV, you should first get tested for HIV.  You should then be tested every 3 months for as long as you are taking the medicine. Pregnancy  If you are about to stop having your period (premenopausal) and you may become pregnant, seek counseling before you get pregnant.  Take 400 to 800 micrograms (mcg) of folic acid every day if you become pregnant.  Ask for birth control (contraception) if you want to prevent pregnancy. Osteoporosis and menopause Osteoporosis is a disease in which the bones lose minerals and strength with aging. This can result in bone fractures. If you are 21 years old or older, or if you are at risk for osteoporosis and fractures, ask your health care provider if you should:  Be screened for bone loss.  Take a calcium or vitamin D supplement to lower your risk of fractures.  Be given hormone replacement therapy (HRT) to treat symptoms of menopause. Follow these instructions at home: Lifestyle  Do not use any products that contain nicotine or tobacco, such as cigarettes, e-cigarettes, and chewing tobacco. If you need help quitting, ask your health care provider.  Do not use street drugs.  Do not share needles.  Ask your health care provider for help if you need support or information about quitting drugs. Alcohol use  Do not drink alcohol if: ? Your health care provider tells you not to drink. ? You are pregnant, may be pregnant, or are planning to become pregnant.  If you drink alcohol: ? Limit how much you use to 0-1 drink a day. ? Limit intake if you are breastfeeding.  Be aware of how much alcohol is in your drink. In the [place] one drink equals one 12 oz bottle of beer (355 mL), one 5 oz glass of wine (148 mL), or one 1 oz glass of hard liquor (44 mL). General instructions  Schedule regular health, dental, and eye exams.  Stay current with your vaccines.  Tell your health care provider if: ? You often feel depressed. ? You have ever been abused or do not feel safe at  home. Summary  Adopting a healthy lifestyle and getting preventive care are important in promoting health and wellness.  Follow your health care provider's instructions about healthy diet, exercising, and getting tested or screened for diseases.  Follow your health care provider's instructions on monitoring your cholesterol and blood pressure. This information is not intended to replace advice given to you by your health care provider. Make sure you discuss any questions you have with your health care provider. Document Revised: 07/31/2018 Document Reviewed: 07/31/2018 Elsevier Patient Education  2021 Elsevier Inc.   Diabetes Mellitus and Standards of Medical Care Living with and managing diabetes (diabetes mellitus) can be complicated. Your diabetes treatment may be managed by a team of health care providers, including:  A physician who specializes in diabetes (endocrinologist). You might also have visits with a nurse practitioner or physician assistant.  Nurses.  A registered dietitian.  A certified diabetes care and education specialist.  An exercise specialist.  A pharmacist.  An eye doctor.  A foot specialist (podiatrist).  A dental care provider.  A primary care provider.  A mental health care provider. How to manage your diabetes You can do many things to successfully manage your diabetes. Your health care providers will follow guidelines to help you get the best quality of care. Here are general guidelines for your diabetes management plan. Your health care providers may give you more specific instructions. Physical exams When you are diagnosed with diabetes, and each year after that, your health care provider will ask about your medical and family history. You will have a physical exam, which may include:  Measuring your height, weight, and body mass index (BMI).  Checking your blood pressure. This will be done at every routine medical visit. Your target blood  pressure may vary depending on your medical conditions, your age, and other factors.  A thyroid exam.  A skin exam.  Screening for nerve damage (peripheral neuropathy). This may include checking the pulse in your legs and feet and the level of sensation in your hands and feet.  A foot exam to inspect the structure and skin of your feet, including checking for cuts, bruises, redness, blisters, sores, or other problems.  Screening for blood vessel (vascular) problems. This may include checking the pulse in your legs and feet and checking your temperature. Blood tests Depending on your treatment plan and your personal needs, you may have the following tests:  Hemoglobin A1C (HbA1C). This test provides information about blood sugar (glucose) control over the previous 2-3 months. It is used to adjust your treatment plan, if needed. This test will be done: ? At least 2 times a year, if you are meeting your treatment goals. ? 4 times a year, if you are not meeting your treatment goals or if your goals have changed.  Lipid testing, including total cholesterol, LDL and HDL cholesterol, and triglyceride levels. ? The goal for LDL is less than 100 mg/dL (5.5 mmol/L). If you are at high risk for complications, the goal is less than 70 mg/dL (3.9 mmol/L). ? The goal for HDL is 40 mg/dL (2.2 mmol/L) or higher for men, and 50 mg/dL (2.8 mmol/L) or higher for women. An HDL cholesterol of 60 mg/dL (3.3 mmol/L) or higher gives some protection against heart disease. ? The goal for triglycerides is less than 150 mg/dL (8.3 mmol/L).  Liver function tests.  Kidney function tests.  Thyroid function tests.   Dental and eye exams  Visit your dentist two times a year.  If you have type 1 diabetes, your health care provider may recommend an eye exam within 5 years after you are diagnosed, and then once a year after your first exam. ? For children with type 1 diabetes, the health care provider may recommend an  eye exam when your child is age 55 or older and has had diabetes for 3-5 years. After the first exam, your child should get an eye exam once a year.  If you have type 2 diabetes, your health care provider may recommend an eye exam as soon as you are diagnosed, and then every 1-2 years after your first exam.   Immunizations  A yearly flu (influenza) vaccine is recommended annually for everyone 6 months or older. This is especially important if you have diabetes.  The pneumonia (pneumococcal) vaccine is recommended for everyone 2 years or older who has diabetes. If you are age 55 or older, you may get the pneumonia vaccine as a series  of two separate shots.  The hepatitis B vaccine is recommended for adults shortly after being diagnosed with diabetes. Adults and children with diabetes should receive all other vaccines according to age-specific recommendations from the Centers for Disease Control and Prevention (CDC). Mental and emotional health Screening for symptoms of eating disorders, anxiety, and depression is recommended at the time of diagnosis and after as needed. If your screening shows that you have symptoms, you may need more evaluation. You may work with a mental health care provider. Follow these instructions at home: Treatment plan You will monitor your blood glucose levels and may give yourself insulin. Your treatment plan will be reviewed at every medical visit. You and your health care provider will discuss:  How you are taking your medicines, including insulin.  Any side effects you have.  Your blood glucose level target goals.  How often you monitor your blood glucose level.  Lifestyle habits, such as activity level and tobacco, alcohol, and substance use. Education Your health care provider will assess how well you are monitoring your blood glucose levels and whether you are taking your insulin and medicines correctly. He or she may refer you to:  A certified diabetes  care and education specialist to manage your diabetes throughout your life, starting at diagnosis.  A registered dietitian who can create and review your personal nutrition plan.  An exercise specialist who can discuss your activity level and exercise plan. General instructions  Take over-the-counter and prescription medicines only as told by your health care provider.  Keep all follow-up visits. This is important. Where to find support There are many diabetes support networks, including:  American Diabetes Association (ADA): diabetes.org  Defeat Diabetes Foundation: defeatdiabetes.org Where to find more information  American Diabetes Association (ADA): www.diabetes.org  Association of Diabetes Care & Education Specialists (ADCES): diabeteseducator.org  International Diabetes Federation (IDF): http://hill.biz/ Summary  Managing diabetes (diabetes mellitus) can be complicated. Your diabetes treatment may be managed by a team of health care providers.  Your health care providers follow guidelines to help you get the best quality care.  You should have physical exams, blood tests, blood pressure monitoring, immunizations, and screening tests regularly. Stay updated on how to manage your diabetes.  Your health care providers may also give you more specific instructions based on your individual health. This information is not intended to replace advice given to you by your health care provider. Make sure you discuss any questions you have with your health care provider. Document Revised: 02/12/2020 Document Reviewed: 02/12/2020 Elsevier Patient Education  2021 [employer].

## 2020-12-02 NOTE — Assessment & Plan Note (Signed)
Flu shot counseled. Covid-19 declines but extensively counseled. Pneumonia 23 given at visit. Shingrix declines. Tetanus declines. Colonoscopy declines after extensive counseling. Mammogram declines after counseling, pap smear counseled. Counseled about sun safety and mole surveillance. Counseled about the dangers of distracted driving. Given 10 year screening recommendations.

## 2020-12-02 NOTE — Progress Notes (Addendum)
   Subjective:   Patient ID: Pamela Shannon, female    DOB: 09/17/1965, 55 y.o.   MRN: [id number]  HPI The patient is a 55 YO female coming in as transfer care for concerns about numbness in her hands (worsening for months or so, wears carpal tunnel brace at night time which helps, has uncontrolled diabetes, happens randomly, denies weakness, denies prior treatment for this) and diabetes (seeing endo, not well controlled, on multiple insulin regimen and farxiga, and metformin and last HgA1c 8.6, has some hyperlipidemia) and restless leg (taking gabapentin prn at night time for this, denies overall worsening, denies muscle cramps).   Also requests physical. PMH, FMH, social history reviewed and updated  Review of Systems  Constitutional: Negative.   HENT: Negative.   Eyes: Negative.   Respiratory: Negative for cough, chest tightness and shortness of breath.   Cardiovascular: Negative for chest pain, palpitations and leg swelling.  Gastrointestinal: Negative for abdominal distention, abdominal pain, constipation, diarrhea, nausea and vomiting.  Musculoskeletal: Negative.   Skin: Negative.   Neurological: Positive for numbness.  Psychiatric/Behavioral: Negative.     Objective:  Physical Exam Constitutional:      Appearance: She is well-developed. She is obese.  HENT:     Head: Normocephalic and atraumatic.  Cardiovascular:     Rate and Rhythm: Normal rate and regular rhythm.  Pulmonary:     Effort: Pulmonary effort is normal. No respiratory distress.     Breath sounds: Normal breath sounds. No wheezing or rales.  Abdominal:     General: Bowel sounds are normal. There is no distension.     Palpations: Abdomen is soft.     Tenderness: There is no abdominal tenderness. There is no rebound.  Musculoskeletal:     Cervical back: Normal range of motion.  Skin:    General: Skin is warm and dry.  Neurological:     Mental Status: She is alert and oriented to person, place, and time.      Coordination: Coordination normal.     Vitals:   12/02/20 1015  BP: 132/74  Pulse: 96  Resp: 18  Temp: 98.8 F (37.1 C)  TempSrc: Oral  SpO2: 95%  Weight: 276 lb (125.2 kg)  Height: 5\' 9"  (1.753 m)   This visit occurred during the SARS-CoV-2 public health emergency.  Safety protocols were in place, including screening questions prior to the visit, additional usage of staff PPE, and extensive cleaning of exam room while observing appropriate contact time as indicated for disinfecting solutions.   Assessment & Plan:  Pneumonia 23 given at visit

## 2020-12-02 NOTE — Assessment & Plan Note (Signed)
Taking fenofibrate butnot on statin. Declines today.

## 2020-12-02 NOTE — Addendum Note (Signed)
Addended by: Manuela Schwartz on: 12/02/2020 04:57 PM   Modules accepted: Orders

## 2020-12-02 NOTE — Assessment & Plan Note (Signed)
Checking TSH and vitamin D and vitamin B12. Could be related to uncontrolled diabetes. Continue using carpal tunnel braces.

## 2020-12-02 NOTE — Telephone Encounter (Signed)
LVM--DOT forms is complete and ready to be pick-up at the front office

## 2020-12-15 ENCOUNTER — Other Ambulatory Visit: Payer: Self-pay | Admitting: *Deleted

## 2020-12-15 MED ORDER — HUMULIN R U-500 KWIKPEN 500 UNIT/ML ~~LOC~~ SOPN: PEN_INJECTOR | SUBCUTANEOUS | 1 refills | Status: DC

## 2020-12-21 ENCOUNTER — Other Ambulatory Visit: Payer: Self-pay | Admitting: *Deleted

## 2020-12-21 ENCOUNTER — Telehealth: Payer: Self-pay | Admitting: Endocrinology

## 2020-12-21 MED ORDER — HUMULIN R U-500 KWIKPEN 500 UNIT/ML ~~LOC~~ SOPN: PEN_INJECTOR | SUBCUTANEOUS | 1 refills | Status: DC

## 2020-12-21 NOTE — Telephone Encounter (Signed)
Patient called to advise that [facility] has not been able to fulfil her Humulin R U-500 Pen.  [facility] advised patient that they had faxed office a request for additional information and that we could respond to that or contact them by phone to advise.  Patient also requests a 30-day fill be sent to local pharmacy - [facility] on [address] because she only has 1 day of medication left and [facility] will not be able to get medicine to her in time  Call back # [phone]

## 2020-12-21 NOTE — Telephone Encounter (Signed)
That form which was a clarification for her medications was faxed back on 12/16/20 and was just refaxed again today.

## 2020-12-27 ENCOUNTER — Other Ambulatory Visit: Payer: Commercial Managed Care - PPO

## 2020-12-30 ENCOUNTER — Ambulatory Visit: Payer: Commercial Managed Care - PPO | Admitting: Endocrinology

## 2021-01-06 ENCOUNTER — Other Ambulatory Visit: Payer: Self-pay | Admitting: *Deleted

## 2021-01-06 MED ORDER — INSULIN REGULAR HUMAN 100 UNIT/ML IJ SOLN: 60.0000 [IU] | Freq: Three times a day (TID) | INTRAMUSCULAR | 1 refills | Status: DC

## 2021-01-10 ENCOUNTER — Other Ambulatory Visit: Payer: Self-pay

## 2021-01-10 ENCOUNTER — Other Ambulatory Visit (INDEPENDENT_AMBULATORY_CARE_PROVIDER_SITE_OTHER): Payer: Commercial Managed Care - PPO

## 2021-01-10 DIAGNOSIS — Z794 Long term (current) use of insulin: Secondary | ICD-10-CM | POA: Diagnosis not present

## 2021-01-10 DIAGNOSIS — E1165 Type 2 diabetes mellitus with hyperglycemia: Secondary | ICD-10-CM | POA: Diagnosis not present

## 2021-01-10 DIAGNOSIS — E782 Mixed hyperlipidemia: Secondary | ICD-10-CM | POA: Diagnosis not present

## 2021-01-10 LAB — HEMOGLOBIN A1C: Hgb A1c MFr Bld: 7.4 % — ABNORMAL HIGH (ref 4.6–6.5)

## 2021-01-10 LAB — MICROALBUMIN / CREATININE URINE RATIO
Creatinine,U: 66.1 mg/dL
Microalb Creat Ratio: 8.3 mg/g (ref 0.0–30.0)
Microalb, Ur: 5.5 mg/dL — ABNORMAL HIGH (ref 0.0–1.9)

## 2021-01-11 ENCOUNTER — Other Ambulatory Visit: Payer: Commercial Managed Care - PPO

## 2021-01-11 ENCOUNTER — Telehealth: Payer: Self-pay | Admitting: Endocrinology

## 2021-01-11 LAB — BASIC METABOLIC PANEL
BUN: 14 mg/dL (ref 6–23)
CO2: 23 mEq/L (ref 19–32)
Calcium: 9.2 mg/dL (ref 8.4–10.5)
Chloride: 103 mEq/L (ref 96–112)
Creatinine, Ser: 0.74 mg/dL (ref 0.40–1.20)
GFR: 91.26 mL/min (ref >60.00)
Glucose, Bld: 148 mg/dL — ABNORMAL HIGH (ref 70–99)
Potassium: 4.1 mEq/L (ref 3.5–5.1)
Sodium: 137 mEq/L (ref 135–145)

## 2021-01-11 LAB — LDL CHOLESTEROL, DIRECT: Direct LDL: 127 mg/dL

## 2021-01-11 MED ORDER — HUMULIN R U-500 KWIKPEN 500 UNIT/ML ~~LOC~~ SOPN: 60.0000 [IU] | PEN_INJECTOR | Freq: Three times a day (TID) | SUBCUTANEOUS | 1 refills | Status: DC

## 2021-01-11 NOTE — Telephone Encounter (Signed)
Sent as requested.

## 2021-01-11 NOTE — Telephone Encounter (Signed)
Disregard last message pt is needing insulin regular human CONCENTRATED (HUMULIN R U-500 KWIKPEN) 500 UNIT/ML kwikpen sent to the  [facility] [place], [place] - [address] [place], [address]

## 2021-01-11 NOTE — Telephone Encounter (Signed)
Pt calling in stating that the pharmacy states that the prescription was written wrong. insulin regular (HUMULIN R) 100 units/mL injection.[name] is trying to reach out to Dr. Lucianne Muss.   [facility] - [place], [place] - [address], [address]

## 2021-01-14 ENCOUNTER — Ambulatory Visit: Payer: Commercial Managed Care - PPO | Admitting: Endocrinology

## 2021-01-18 ENCOUNTER — Other Ambulatory Visit: Payer: Self-pay | Admitting: Endocrinology

## 2021-01-24 ENCOUNTER — Encounter: Payer: Self-pay | Admitting: Endocrinology

## 2021-01-24 ENCOUNTER — Other Ambulatory Visit: Payer: Self-pay

## 2021-01-24 ENCOUNTER — Ambulatory Visit (INDEPENDENT_AMBULATORY_CARE_PROVIDER_SITE_OTHER): Payer: Commercial Managed Care - PPO | Admitting: Endocrinology

## 2021-01-24 VITALS — BP 130/76 | HR 90 | Ht 69.0 in | Wt 275.2 lb

## 2021-01-24 DIAGNOSIS — E782 Mixed hyperlipidemia: Secondary | ICD-10-CM

## 2021-01-24 DIAGNOSIS — Z794 Long term (current) use of insulin: Secondary | ICD-10-CM

## 2021-01-24 DIAGNOSIS — E1165 Type 2 diabetes mellitus with hyperglycemia: Secondary | ICD-10-CM | POA: Diagnosis not present

## 2021-01-24 DIAGNOSIS — K760 Fatty (change of) liver, not elsewhere classified: Secondary | ICD-10-CM | POA: Diagnosis not present

## 2021-01-24 MED ORDER — OZEMPIC (0.25 OR 0.5 MG/DOSE) 2 MG/1.5ML ~~LOC~~ SOPN: 0.5000 mg | PEN_INJECTOR | SUBCUTANEOUS | 2 refills | Status: DC

## 2021-01-24 NOTE — Patient Instructions (Addendum)
70 units in am, 50 at lunch and 60 before pm snack  Ozempic 0.25 for 2 shots then 0.5 mg weekly, then call for 1mg  Rx

## 2021-01-24 NOTE — Progress Notes (Signed)
Patient ID: Pamela Shannon, female   DOB: [birth year], 55 y.o.   MRN: [id number]           Reason for Appointment: Endocrinology follow-up   History of Present Illness:          Diagnosis: Type 2 diabetes mellitus, date of diagnosis: ?  2011       Past history:  At diagnosis she was having symptoms of marked fatigue, dry mouth but not clear how high her sugars were. She has been on various medications for treating her diabetes, probably metformin to start with. Also around the time of diagnosis she thinks she weighed about 300 pounds. She thinks she was able to lose weight subsequently and about 2 years ago had lost down to nearly 200 pounds All the details are not available of her previous management but she appears to have had progressively worsening requiring multiple agents and additional therapies. She thinks her A1c has been over 9% for about 2 years or more, lab records are not available She probably had been on metformin along with Tanzeum about a year or so ago. Because of inadequate control she was also given Invokamet at some point.  In 9/15 she was switched from Tanzeum to [place] and started on bedtime Levemir insulin, 20 units  She had tried on Ozempic and Trulicity, both stopped because of cost  Recent history:  INSULIN regimen: Toujeo 140 units, Humulin R U-500 60 tid  Non- hypoglycemic drugs the patient is taking are: Metformin 500 mg a.m., 1000 mg p.m., Farxiga 10 mg daily,    Her A1c is most recently 7.4 compared to 8.6  Current management, blood sugar patterns and problems identified:   She is using her freestyle libre and this was finally downloaded for review  With changing Humalog to Humulin R U-500 her blood sugars are dramatically better although still not at target  She finds this affordable compared to Humalog and is generally trying to take it up to 30 minutes before eating as directed  She does show some variability in her blood sugars as well as rise  in blood sugars in the mornings with highest readings late morning.  On the other hand has not gained any weight with improved glycemia  Has continued the same dose of Toujeo  Generally fasting blood sugars are much better, previously had difficulty with high fasting readings  She is currently taking Farxiga   CGM data for the last 2 weeks data analysis is as follows   Blood sugars show moderate variability but tending to be higher after breakfast and at least till mid afternoon before coming down  Blood sugars are excellent early morning but rising after 6 AM  Lowest blood sugars are between 6-8 PM before dinner without hypoglycemia  Postprandial hyperglycemia appears to be somewhat inconsistent from day-to-day but mostly midmorning, may be occasionally persistently higher  Hypoglycemia has been minimal with only 1 or 2 readings around 66-69  CGM use % of time  93  2-week average/GV  160/28  Time in range        69%  % Time Above 180  27+4  % Time above 250   % Time Below 70 0     PRE-MEAL Fasting Lunch Dinner Bedtime Overall  Glucose range:       Averages:  146  171  142   1260   POST-MEAL PC Breakfast PC Lunch PC Dinner  Glucose range:     Averages:  197  177  169     PREVIOUSLY:  CGM use % of time  56  2-week average/GV  243/24  Time in range     13   %  % Time Above 180  430  % Time above 250  44  % Time Below 70      PRE-MEAL Fasting Lunch Dinner Bedtime Overall  Glucose range:       Averages:  210  257  223 278    POST-MEAL PC Breakfast PC Lunch PC Dinner  Glucose range:     Averages:  251  235  246    Side effects from medications have been: None    Self-care: The diet that the patient has been following is: Variable    Meals: 2-3 meals per day, evening meal at 5 PM. Breakfast is protein shake, toast or oatmeal, sometimes cereal.    Has snacks with peanut butter crackers            Dietician visit, most recent: 04/21/2020                Weight history: Previous range 200-260  Wt Readings from Last 3 Encounters:  01/24/21 275 lb 4 oz (124.9 kg)  12/02/20 276 lb (125.2 kg)  10/14/20 273 lb (123.8 kg)      Lab Results  Component Value Date   HGBA1C 7.4 (H) 01/10/2021   HGBA1C 8.6 (A) 08/30/2020   HGBA1C 8.3 (H) 04/29/2020   Lab Results  Component Value Date   MICROALBUR 5.5 (H) 01/10/2021   LDLCALC 89 01/31/2016   CREATININE 0.74 01/10/2021    No visits with results within 1 Week(s) from this visit.  Latest known visit with results is:  Lab on 01/10/2021  Component Date Value Ref Range Status  . Microalb, Ur 01/10/2021 5.5* 0.0 - 1.9 mg/dL Final  . Creatinine,U 01/10/2021 66.1  mg/dL Final  . Microalb Creat Ratio 01/10/2021 8.3  0.0 - 30.0 mg/g Final  . Direct LDL 01/10/2021 127.0  mg/dL Final   Optimal:  <100 mg/dLNear or Above Optimal:  100-129 mg/dLBorderline High:  130-159 mg/dLHigh:  160-189 mg/dLVery High:  >190 mg/dL  . Sodium 01/10/2021 137  135 - 145 mEq/L Final  . Potassium 01/10/2021 4.1  3.5 - 5.1 mEq/L Final  . Chloride 01/10/2021 103  96 - 112 mEq/L Final  . CO2 01/10/2021 23  19 - 32 mEq/L Final  . Glucose, Bld 01/10/2021 148* 70 - 99 mg/dL Final  . BUN 01/10/2021 14  6 - 23 mg/dL Final  . Creatinine, Ser 01/10/2021 0.74  0.40 - 1.20 mg/dL Final  . GFR 01/10/2021 91.26  >60.00 mL/min Final   Calculated using the CKD-EPI Creatinine Equation (2021)  . Calcium 01/10/2021 9.2  8.4 - 10.5 mg/dL Final  . Hgb A1c MFr Bld 01/10/2021 7.4* 4.6 - 6.5 % Final   Glycemic Control Guidelines for People with Diabetes:Non Diabetic:  <6%Goal of Therapy: <7%Additional Action Suggested:  >8%       Allergies as of 01/24/2021   No Known Allergies     Medication List       Accurate as of January 24, 2021 11:59 PM. If you have any questions, ask your nurse or doctor.        buPROPion 150 MG 24 hr tablet Commonly known as: WELLBUTRIN XL TAKE 1 TABLET BY MOUTH  TWICE DAILY   Farxiga 10 MG Tabs  tablet Generic drug: dapagliflozin propanediol TAKE 1 TABLET BY MOUTH ONCE DAILY BEFORE BREAKFAST  fenofibrate 145 MG tablet Commonly known as: TRICOR TAKE 1 TABLET BY MOUTH  DAILY   FreeStyle Libre 2 Sensor Misc APPLY EVERY 14 DAYS   gabapentin 300 MG capsule Commonly known as: NEURONTIN TAKE 1 CAPSULE BY MOUTH AT  BEDTIME   HumaLOG KwikPen 100 UNIT/ML KwikPen Generic drug: insulin lispro Inject into the skin.   HumuLIN R U-500 KwikPen 500 UNIT/ML kwikpen Generic drug: insulin regular human CONCENTRATED Inject 60 Units into the skin 3 (three) times daily with meals. 30 minutes before each meal   levonorgestrel 20 MCG/24HR IUD Commonly known as: MIRENA 1 each by Intrauterine route once.   metFORMIN 500 MG 24 hr tablet Commonly known as: GLUCOPHAGE-XR TAKE 4 TABLETS BY MOUTH  ONCE DAILY WITH SUPPER   MULTIVITAMIN ADULT PO Take 1 tablet by mouth daily.   OneTouch Delica Lancets 18H Misc 1 each by Does not apply route 3 (three) times daily. E11.9   OneTouch Verio Flex System w/Device Kit 1 each by Does not apply route 3 (three) times daily. E11.9   OneTouch Verio test strip Generic drug: glucose blood 1 each by Other route 3 (three) times daily. E11.9   Ozempic (0.25 or 0.5 MG/DOSE) 2 MG/1.5ML Sopn Generic drug: Semaglutide(0.25 or 0.5MG/DOS) Inject 0.5 mg into the skin once a week. Started by: Elayne Snare, MD   Toujeo Max SoloStar 300 UNIT/ML Solostar Pen Generic drug: insulin glargine (2 Unit Dial) INJECT SUBCUTANEOUSLY 110  UNITS DAILY   vitamin C 1000 MG tablet Take 1,000 mg by mouth daily.       Allergies: No Known Allergies  Past Medical History:  Diagnosis Date  . Back pain   . Diabetes mellitus     No past surgical history on file.  Family History  Problem Relation Age of Onset  . Diabetes Father   . Diabetes Mother   . Fibromyalgia Mother   . Heart disease Maternal Grandmother   . Gout Maternal Grandmother   . Heart attack Maternal  Grandfather     Social History:  reports that she has quit smoking. She has never used smokeless tobacco. She reports that she does not drink alcohol and does not use drugs.    Review of Systems    She on Wellbutrin 300 mg SR prescribed for improving her appetite and mood         Lipids: Not well controlled including triglycerides, currently taking only fenofibrate      Lab Results  Component Value Date   CHOL 178 10/11/2020   HDL 34.50 (L) 10/11/2020   LDLCALC 89 01/31/2016   LDLDIRECT 127.0 01/10/2021   TRIG 257.0 (H) 10/11/2020   CHOLHDL 5 10/11/2020               Had persistently abnormal liver functions, possibly from fatty liver  Lab Results  Component Value Date   ALT 45 (H) 10/11/2020   ALT 56 (H) 08/26/2020   ALT 63 (H) 04/29/2020        She has periodic numbness and burning in feet.    She takes gabapentin as needed for restless legs at night  Last eye exam 2017, reportedly had background retinopathy Has not followed up on this and cannot afford the co-pay   Blood pressure appears to be fluctuating  BP Readings from Last 3 Encounters:  01/24/21 130/76  12/02/20 132/74  10/14/20 (!) 152/80     Physical Examination:  BP 130/76   Pulse 90   Ht '5\' 9"'  (1.753 m)  Wt 275 lb 4 oz (124.9 kg)   SpO2 98%   BMI 40.65 kg/m    ASSESSMENT/PLAN:  Diabetes type 2, insulin requiring with obesity  See history of present illness for detailed discussion of current diabetes management, blood sugar patterns and problems identified   A1c is last 8.6 and now 7.4  She has considerably benefited from using Humulin R U-500 insulin instead of Humalog Also fasting readings are better Although she is concerned about not losing weight she is able to maintain it instead of previous history of weight gain She does tend to have higher readings in the early part of the day but lowest readings from her lunchtime insulin, after dinner readings are fairly well controlled  into the night  Hyperlipidemia: Will need fasting follow-up labs and consider adding a statin  Urine microalbumin normal  History of abnormal liver functions likely fatty Follow-up  Plan:   She will try Ozempic and if this is affordable hopefully can continue to take this along with benefits of weight loss and reduced insulin requirement Explained to the patient what GLP-1 drugs are, the sites of actions and the body, reduction of hunger sensation and improved insulin secretion.  Discussed the benefit of weight loss with these medications. Explained possible side effects of OZEMPIC, most commonly nausea that usually improves over time.  Also explained safety information associated with the medication Demonstrated the medication injection device and injection technique to the patient.  To start the injections with the 0.25 mg dosage weekly for the first 2 injections and then go up to 0.5 mg weekly if no continued nausea She will then call for the prescription for the 1 mg dose  Patient brochure on Ozempic with enclosed co-pay card given  Encourage her to walk more regularly for exercise  HUMULIN R will be increased to 70 units to be taken on waking up along with reducing the Lantus dose at 50 units  No change in Toujeo unless early morning sugars start getting lower     Patient Instructions  70 units in am, 50 at lunch and 60 before pm snack  Ozempic 0.25 for 2 shots then 0.5 mg weekly, then call for 28m Rx       AElayne Snare6/03/2021, 9:55 AM   Note: This office note was prepared with Dragon voice rec ognition system technology. Any transcriptional errors that result from this process are unintentional.

## 2021-02-21 ENCOUNTER — Other Ambulatory Visit: Payer: Self-pay | Admitting: Endocrinology

## 2021-03-12 ENCOUNTER — Other Ambulatory Visit: Payer: Self-pay | Admitting: Endocrinology

## 2021-03-18 ENCOUNTER — Other Ambulatory Visit: Payer: Self-pay | Admitting: Endocrinology

## 2021-04-27 ENCOUNTER — Other Ambulatory Visit: Payer: Self-pay

## 2021-04-27 ENCOUNTER — Telehealth: Payer: Self-pay | Admitting: Endocrinology

## 2021-04-27 DIAGNOSIS — Z794 Long term (current) use of insulin: Secondary | ICD-10-CM

## 2021-04-27 DIAGNOSIS — E1165 Type 2 diabetes mellitus with hyperglycemia: Secondary | ICD-10-CM

## 2021-04-27 NOTE — Telephone Encounter (Signed)
Per notes, you wanted patient to increase to 1mg  per week but because of shortage I did not know if you wanted to keep her on current dosage or not.

## 2021-04-27 NOTE — Telephone Encounter (Signed)
This is being handled on another encounter. Waiting on Dr to reply.

## 2021-04-27 NOTE — Telephone Encounter (Signed)
MEDICATION: Semaglutide,0.25 or 0.5MG /DOS, (OZEMPIC, 0.25 OR 0.5 MG/DOSE,) 2 MG/1.5ML SOPN  PHARMACY:   [facility]  [facility] [place], [place] - [address] [place] Phone:  [phone]  Fax:  [phone]      HAS THE PATIENT CONTACTED THEIR PHARMACY?  Yes-requires new RX  IS THIS A 90 DAY SUPPLY : Yes  IS PATIENT OUT OF MEDICATION: No  IF NOT; HOW MUCH IS LEFT: Approx. 10 days  LAST APPOINTMENT DATE: @9 /02/2021  NEXT APPOINTMENT DATE:@9 /07/2021  DO WE HAVE YOUR PERMISSION TO LEAVE A DETAILED MESSAGE?: Yes  OTHER COMMENTS:    **Let patient know to contact pharmacy at the end of the day to make sure medication is ready. **  ** Please notify patient to allow 48-72 hours to process**  **Encourage patient to contact the pharmacy for refills or they can request refills through [facility]**

## 2021-05-02 ENCOUNTER — Ambulatory Visit: Payer: Commercial Managed Care - PPO | Admitting: Endocrinology

## 2021-05-02 ENCOUNTER — Other Ambulatory Visit: Payer: Commercial Managed Care - PPO

## 2021-05-02 NOTE — Telephone Encounter (Signed)
Per notes, you wanted patient to increase to 1mg per week but because of shortage I did not know if you wanted to keep her on current dosage or not. 

## 2021-05-03 MED ORDER — OZEMPIC (1 MG/DOSE) 4 MG/3ML ~~LOC~~ SOPN: 1.0000 mg | PEN_INJECTOR | SUBCUTANEOUS | 3 refills | Status: DC

## 2021-05-03 NOTE — Addendum Note (Signed)
Addended by: Eliseo Squires on: 05/03/2021 11:59 AM   Modules accepted: Orders

## 2021-05-03 NOTE — Telephone Encounter (Signed)
Rx sent to preferred pharmacy. Spoke with patient and informed that it was sent to [facility]

## 2021-05-09 ENCOUNTER — Ambulatory Visit: Payer: Commercial Managed Care - PPO | Admitting: Endocrinology

## 2021-05-09 NOTE — Telephone Encounter (Signed)
LMTCB to reschedule cancelled appointments and prior labs

## 2021-05-10 NOTE — Telephone Encounter (Signed)
Mychart message sent to pt requesting a call back to schedule appt

## 2021-05-17 NOTE — Telephone Encounter (Signed)
Patient is scheduled for labs on 05/27/21 and follow up appointment on 06/02/21

## 2021-05-20 ENCOUNTER — Other Ambulatory Visit: Payer: Commercial Managed Care - PPO

## 2021-05-20 ENCOUNTER — Ambulatory Visit: Payer: Commercial Managed Care - PPO | Admitting: Nurse Practitioner

## 2021-05-24 ENCOUNTER — Other Ambulatory Visit (INDEPENDENT_AMBULATORY_CARE_PROVIDER_SITE_OTHER): Payer: BLUE CROSS/BLUE SHIELD

## 2021-05-24 ENCOUNTER — Other Ambulatory Visit: Payer: Self-pay

## 2021-05-24 DIAGNOSIS — E782 Mixed hyperlipidemia: Secondary | ICD-10-CM

## 2021-05-24 DIAGNOSIS — Z794 Long term (current) use of insulin: Secondary | ICD-10-CM

## 2021-05-24 DIAGNOSIS — E1165 Type 2 diabetes mellitus with hyperglycemia: Secondary | ICD-10-CM

## 2021-05-24 LAB — COMPREHENSIVE METABOLIC PANEL
ALT: 33 U/L (ref 0–35)
AST: 35 U/L (ref 0–37)
Albumin: 4.1 g/dL (ref 3.5–5.2)
Alkaline Phosphatase: 67 U/L (ref 39–117)
BUN: 15 mg/dL (ref 6–23)
CO2: 28 mEq/L (ref 19–32)
Calcium: 9.6 mg/dL (ref 8.4–10.5)
Chloride: 101 mEq/L (ref 96–112)
Creatinine, Ser: 0.8 mg/dL (ref 0.40–1.20)
GFR: 82.9 mL/min (ref >60.00)
Glucose, Bld: 135 mg/dL — ABNORMAL HIGH (ref 70–99)
Potassium: 3.9 mEq/L (ref 3.5–5.1)
Sodium: 136 mEq/L (ref 135–145)
Total Bilirubin: 0.5 mg/dL (ref 0.2–1.2)
Total Protein: 7.7 g/dL (ref 6.0–8.3)

## 2021-05-24 LAB — LIPID PANEL
Cholesterol: 162 mg/dL (ref 0–200)
HDL: 35.4 mg/dL — ABNORMAL LOW (ref >39.00)
NonHDL: 127.09
Total CHOL/HDL Ratio: 5
Triglycerides: 224 mg/dL — ABNORMAL HIGH (ref 0.0–149.0)
VLDL: 44.8 mg/dL — ABNORMAL HIGH (ref 0.0–40.0)

## 2021-05-24 LAB — LDL CHOLESTEROL, DIRECT: Direct LDL: 96 mg/dL

## 2021-05-24 LAB — HEMOGLOBIN A1C: Hgb A1c MFr Bld: 6.6 % — ABNORMAL HIGH (ref 4.6–6.5)

## 2021-05-26 ENCOUNTER — Other Ambulatory Visit: Payer: Self-pay

## 2021-05-26 ENCOUNTER — Ambulatory Visit: Payer: BLUE CROSS/BLUE SHIELD | Admitting: Nurse Practitioner

## 2021-05-26 ENCOUNTER — Encounter: Payer: Self-pay | Admitting: Nurse Practitioner

## 2021-05-26 VITALS — BP 130/84 | HR 87 | Temp 98.3°F | Ht 69.0 in | Wt 275.0 lb

## 2021-05-26 DIAGNOSIS — G4700 Insomnia, unspecified: Secondary | ICD-10-CM

## 2021-05-26 DIAGNOSIS — F419 Anxiety disorder, unspecified: Secondary | ICD-10-CM | POA: Diagnosis not present

## 2021-05-26 DIAGNOSIS — R21 Rash and other nonspecific skin eruption: Secondary | ICD-10-CM

## 2021-05-26 MED ORDER — HYDROXYZINE HCL 10 MG PO TABS: 10.0000 mg | ORAL_TABLET | Freq: Every evening | ORAL | 0 refills | Status: DC | PRN

## 2021-05-26 NOTE — Progress Notes (Signed)
Subjective:  Patient ID: Pamela Shannon, female    DOB: 1966-02-06  Age: 55 y.o. MRN: [id number]  CC:  Chief Complaint  Patient presents with   Rash      HPI  This patient arrives today for the above.  About 2 weeks ago she had a rash that erupted around her mouth.  Since then it is pretty much resolved on its own.  She was placing Neosporin to the rash but tells me that she has had a allergic reaction Neosporin in the past.  She feels that the Neosporin may have made the rash worse.  She ended up stopping the Neosporin and just plain Vaseline.  And as stated above, over time it resolved on its own.  She denies any lip, tongue, throat swelling or shortness of breath.  No obvious exposures to any new substances. She says she has been under quite a bit of stress and has been making it hard for her to sleep.  She tells me she does have underlying depression and anxiety.  I do see that she is on Wellbutrin.  She tells me she is not able to sleep well lately, and will often wake up early in the morning with difficulty falling back asleep.  Past Medical History:  Diagnosis Date   Back pain    Diabetes mellitus       Family History  Problem Relation Age of Onset   Diabetes Father    Diabetes Mother    Fibromyalgia Mother    Heart disease Maternal Grandmother    Gout Maternal Grandmother    Heart attack Maternal Grandfather     Social History   Social History Narrative   Fun/Hobby: Go to church.    Denies abuse and feels safe at home.    Social History   Tobacco Use   Smoking status: Former   Smokeless tobacco: Never  Substance Use Topics   Alcohol use: No     Current Meds  Medication Sig   hydrOXYzine (ATARAX/VISTARIL) 10 MG tablet Take 1 tablet (10 mg total) by mouth at bedtime as needed for anxiety.    ROS:  See HPI   Objective:   Today's Vitals: BP 130/84 (BP Location: Left Arm, Patient Position: Sitting, Cuff Size: Large)   Pulse 87   Temp 98.3 F (36.8  C) (Oral)   Ht 5\' 9"  (1.753 m)   Wt 275 lb (124.7 kg)   SpO2 96%   BMI 40.61 kg/m  Vitals with BMI 05/26/2021 01/24/2021 12/02/2020  Height 5\' 9"  5\' 9"  5\' 9"   Weight 275 lbs 275 lbs 4 oz 276 lbs  BMI 40.59 40.63 40.74  Systolic 130 130 12/04/2020  Diastolic 84 76 74  Pulse 87 90 96     Physical Exam No rash noted to face today. She did show me a picture from a few days ago which did show some red areas surrounding the mouth.  I did not visualize bumps on the picture, however she told me she had little bumps on the rash as well.      Assessment and Plan   1. Anxiety   2. Insomnia, unspecified type   3. Rash      Plan: 1.,  2.  We discussed some treatment options to help her with sleep.  She is going to trial hydroxyzine.  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  I recommend the first time she tries medication she takes it at night  when she does not to work next day.  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  She tells me she understands.  We also talked about only taking it as needed as opposed taking it every single night.  She is aware of this as well.  She was told that if this seems to help with her sleeping and anxiety that she should make an appoint with Dr. Okey Dupre sooner rather than later to discuss possibly staying on this medicine. 3.  Resolved, no recommendations regarding further work-up at this time.  She was encouraged to call the office if symptoms return that she can be seen when the rashes present.  She tells me she understands.   Tests ordered No orders of the defined types were placed in this encounter.     Meds ordered this encounter  Medications   hydrOXYzine (ATARAX/VISTARIL) 10 MG tablet    Sig: Take 1 tablet (10 mg total) by mouth at bedtime as needed for anxiety.    Dispense:  30 tablet    Refill:  0    Order Specific Question:   Supervising Provider    Answer:   Pincus Sanes  [id number]    Patient to follow-up as needed.  Elenore Paddy, NP

## 2021-05-27 ENCOUNTER — Other Ambulatory Visit: Payer: Commercial Managed Care - PPO

## 2021-05-27 ENCOUNTER — Ambulatory Visit (INDEPENDENT_AMBULATORY_CARE_PROVIDER_SITE_OTHER): Payer: BLUE CROSS/BLUE SHIELD | Admitting: Endocrinology

## 2021-05-27 VITALS — BP 128/76 | HR 95 | Ht 69.0 in | Wt 273.0 lb

## 2021-05-27 DIAGNOSIS — K760 Fatty (change of) liver, not elsewhere classified: Secondary | ICD-10-CM

## 2021-05-27 DIAGNOSIS — Z23 Encounter for immunization: Secondary | ICD-10-CM

## 2021-05-27 DIAGNOSIS — E1165 Type 2 diabetes mellitus with hyperglycemia: Secondary | ICD-10-CM

## 2021-05-27 DIAGNOSIS — E782 Mixed hyperlipidemia: Secondary | ICD-10-CM | POA: Diagnosis not present

## 2021-05-27 DIAGNOSIS — Z794 Long term (current) use of insulin: Secondary | ICD-10-CM | POA: Diagnosis not present

## 2021-05-27 NOTE — Progress Notes (Signed)
Patient ID: Pamela Shannon, female   DOB: May 11, 1966, 55 y.o.   MRN: [id number]           Reason for Appointment: Endocrinology follow-up   History of Present Illness:          Diagnosis: Type 2 diabetes mellitus, date of diagnosis: ?  2011       Past history:  At diagnosis she was having symptoms of marked fatigue, dry mouth but not clear how high her sugars were. She has been on various medications for treating her diabetes, probably metformin to start with. Also around the time of diagnosis she thinks she weighed about 300 pounds. She thinks she was able to lose weight subsequently and about 2 years ago had lost down to nearly 200 pounds All the details are not available of her previous management but she appears to have had progressively worsening requiring multiple agents and additional therapies. She thinks her A1c has been over 9% for about 2 years or more, lab records are not available She probably had been on metformin along with     Tanzeum about a year or so ago. Because of inadequate control she was also given Invokamet at some point.  In 9/15 she was switched from Tanzeum to [place] and started on bedtime Levemir insulin, 20 units  She had tried on Ozempic and Trulicity, both stopped because of cost  Recent history:  INSULIN regimen: Toujeo 140 units once daily, Humulin R U-500 60 units AC tid  Non- hypoglycemic drugs the patient is taking are: Metformin ER 1000 mg a.m., 1000 mg p.m., Farxiga 10 mg daily, Ozempic 1 mg weekly   Her A1c is 6.6 which is her best level in 5 years Previously highest 10.8  Current management, blood sugar patterns and problems identified:  She is using her freestyle libre consistently and this appears to be accurate compared to the lab glucose  She has started her back on Ozempic since June and this has improved her control  However has not lost any weight  She was told to take 70 units of Humulin R in the morning but she is still taking 60  units with every meal regardless of what she is eating  However she does try to take her Humulin in the morning on waking up which is an hour before breakfast at least She only will take a little more when she is noticing any higher readings  Only appears to have some hypoglycemia after breakfast and occasionally around lunchtime  Blood sugars are more consistent compared to her last visit and significantly greater time in range  No hypoglycemia also including overnight She occasionally does get diarrhea with metformin although is trying to take 4 tablets a day, this is mostly in the morning  CGM data for the last 2 weeks is interpreted is as follows  Her blood sugars are within the target range on an average at all times  HYPOGLYCEMIA is seen periodically midday which is after breakfast and only occasionally early afternoon or late evening  Overnight blood sugars are generally fairly stable between 118 and 126 but she does have a mild dawn phenomenon with blood sugars at breakfast time averaging nearly 140  POSTPRANDIAL blood sugars are rising the most after breakfast but only about 40 mg and usually staying level after lunch  Postprandial readings after dinner are only minimally higher compared to Premeal readings  Occasionally blood sugar may be low normal before dinnertime but no hypoglycemia  Variability is  less with GV 22 compared to 28 previously  CGM use % of time 92  2-week average/GV 135/22  Time in range       92%  % Time Above 180 8  % Time above 250   % Time Below 70      PRE-MEAL Fasting Lunch Dinner Bedtime Overall  Glucose range:       Averages: 138 140 125     POST-MEAL PC Breakfast PC Lunch PC Dinner  Glucose range:     Averages: 170 144 141   Previously   CGM use % of time  93  2-week average/GV  160/28  Time in range        69%  % Time Above 180  27+4  % Time above 250   % Time Below 70 0     PRE-MEAL Fasting Lunch Dinner Bedtime Overall  Glucose  range:       Averages:  146  171  142   1260   POST-MEAL PC Breakfast PC Lunch PC Dinner  Glucose range:     Averages:  197  177  169       Side effects from medications have been: None    Self-care: The diet that the patient has been following is: Variable    Meals: 2-3 meals per day, evening meal at 5 PM. Breakfast is protein shake, toast or oatmeal, sometimes cereal.    Has snacks with peanut butter crackers            Dietician visit, most recent: 04/21/2020               Weight history: Previous range 200-260  Wt Readings from Last 3 Encounters:  05/27/21 273 lb (123.8 kg)  05/26/21 275 lb (124.7 kg)  01/24/21 275 lb 4 oz (124.9 kg)      Lab Results  Component Value Date   HGBA1C 6.6 (H) 05/24/2021   HGBA1C 7.4 (H) 01/10/2021   HGBA1C 8.6 (A) 08/30/2020   Lab Results  Component Value Date   MICROALBUR 5.5 (H) 01/10/2021   LDLCALC 89 01/31/2016   CREATININE 0.80 05/24/2021    Lab on 05/24/2021  Component Date Value Ref Range Status   Cholesterol 05/24/2021 162  0 - 200 mg/dL Final   ATP III Classification       Desirable:  < 200 mg/dL               Borderline High:  200 - 239 mg/dL          High:  > = 240 mg/dL   Triglycerides 05/24/2021 224.0 (A) 0.0 - 149.0 mg/dL Final   Normal:  <150 mg/dLBorderline High:  150 - 199 mg/dL   HDL 05/24/2021 35.40 (A) >39.00 mg/dL Final   VLDL 05/24/2021 44.8 (A) 0.0 - 40.0 mg/dL Final   Total CHOL/HDL Ratio 05/24/2021 5   Final                  Men          Women1/2 Average Risk     3.4          3.3Average Risk          5.0          4.42X Average Risk          9.6          7.13X Average Risk          15.0  11.0                       NonHDL 05/24/2021 127.09   Final   NOTE:  Non-HDL goal should be 30 mg/dL higher than patient's LDL goal (i.e. LDL goal of < 70 mg/dL, would have non-HDL goal of < 100 mg/dL)   Sodium 05/24/2021 136  135 - 145 mEq/L Final   Potassium 05/24/2021 3.9  3.5 - 5.1 mEq/L Final   Chloride  05/24/2021 101  96 - 112 mEq/L Final   CO2 05/24/2021 28  19 - 32 mEq/L Final   Glucose, Bld 05/24/2021 135 (A) 70 - 99 mg/dL Final   BUN 05/24/2021 15  6 - 23 mg/dL Final   Creatinine, Ser 05/24/2021 0.80  0.40 - 1.20 mg/dL Final   Total Bilirubin 05/24/2021 0.5  0.2 - 1.2 mg/dL Final   Alkaline Phosphatase 05/24/2021 67  39 - 117 U/L Final   AST 05/24/2021 35  0 - 37 U/L Final   ALT 05/24/2021 33  0 - 35 U/L Final   Total Protein 05/24/2021 7.7  6.0 - 8.3 g/dL Final   Albumin 05/24/2021 4.1  3.5 - 5.2 g/dL Final   GFR 05/24/2021 82.90  >60.00 mL/min Final   Calculated using the CKD-EPI Creatinine Equation (2021)   Calcium 05/24/2021 9.6  8.4 - 10.5 mg/dL Final   Hgb A1c MFr Bld 05/24/2021 6.6 (A) 4.6 - 6.5 % Final   Glycemic Control Guidelines for People with Diabetes:Non Diabetic:  <6%Goal of Therapy: <7%Additional Action Suggested:  >8%    Direct LDL 05/24/2021 96.0  mg/dL Final   Optimal:  <100 mg/dLNear or Above Optimal:  100-129 mg/dLBorderline High:  130-159 mg/dLHigh:  160-189 mg/dLVery High:  >190 mg/dL      Allergies as of 05/27/2021       Reactions   Neosporin [bacitracin-polymyxin B] Rash        Medication List        Accurate as of May 27, 2021 11:59 PM. If you have any questions, ask your nurse or doctor.          STOP taking these medications    HumaLOG KwikPen 100 UNIT/ML KwikPen Generic drug: insulin lispro       TAKE these medications    buPROPion 150 MG 24 hr tablet Commonly known as: WELLBUTRIN XL TAKE 1 TABLET BY MOUTH  TWICE DAILY   Farxiga 10 MG Tabs tablet Generic drug: dapagliflozin propanediol TAKE 1 TABLET BY MOUTH ONCE DAILY BEFORE BREAKFAST   fenofibrate 145 MG tablet Commonly known as: TRICOR TAKE 1 TABLET BY MOUTH  DAILY   FreeStyle Libre 2 Sensor Misc APPLY 1 SENSOR  EVERY TWO WEEKS   gabapentin 300 MG capsule Commonly known as: NEURONTIN TAKE 1 CAPSULE BY MOUTH AT  BEDTIME   HumuLIN R U-500 KwikPen 500 UNIT/ML  KwikPen Generic drug: insulin regular human CONCENTRATED INJECT SUBCUTANEOUSLY 60  UNITS 3 TIMES DAILY 30  MINUTES BEFORE EACH MEAL   hydrOXYzine 10 MG tablet Commonly known as: ATARAX/VISTARIL Take 1 tablet (10 mg total) by mouth at bedtime as needed for anxiety.   levonorgestrel 20 MCG/24HR IUD Commonly known as: MIRENA 1 each by Intrauterine route once.   metFORMIN 500 MG 24 hr tablet Commonly known as: GLUCOPHAGE-XR TAKE 4 TABLETS BY MOUTH  ONCE DAILY WITH SUPPER   MULTIVITAMIN ADULT PO Take 1 tablet by mouth daily.   OneTouch Delica Lancets 94H Misc 1 each by Does not apply  route 3 (three) times daily. E11.9   OneTouch Verio Flex System w/Device Kit 1 each by Does not apply route 3 (three) times daily. E11.9   OneTouch Verio test strip Generic drug: glucose blood 1 each by Other route 3 (three) times daily. E11.9   Ozempic (1 MG/DOSE) 4 MG/3ML Sopn Generic drug: Semaglutide (1 MG/DOSE) Inject 1 mg into the skin once a week.   Toujeo Max SoloStar 300 UNIT/ML Solostar Pen Generic drug: insulin glargine (2 Unit Dial) INJECT SUBCUTANEOUSLY 110  UNITS DAILY   vitamin C 1000 MG tablet Take 1,000 mg by mouth daily.        Allergies:  Allergies  Allergen Reactions   Neosporin [Bacitracin-Polymyxin B] Rash    Past Medical History:  Diagnosis Date   Back pain    Diabetes mellitus     No past surgical history on file.  Family History  Problem Relation Age of Onset   Diabetes Father    Diabetes Mother    Fibromyalgia Mother    Heart disease Maternal Grandmother    Gout Maternal Grandmother    Heart attack Maternal Grandfather     Social History:  reports that she has quit smoking. She has never used smokeless tobacco. She reports that she does not drink alcohol and does not use drugs.    Review of Systems    She on Wellbutrin 300 mg SR prescribed for improving her appetite and mood         Lipids: Has tendency to high triglycerides, currently  taking only fenofibrate      Lab Results  Component Value Date   CHOL 162 05/24/2021   HDL 35.40 (L) 05/24/2021   LDLCALC 89 01/31/2016   LDLDIRECT 96.0 05/24/2021   TRIG 224.0 (H) 05/24/2021   CHOLHDL 5 05/24/2021               Has had persistently abnormal liver functions, possibly from fatty liver, now normal  Lab Results  Component Value Date   ALT 33 05/24/2021   ALT 45 (H) 10/11/2020   ALT 56 (H) 08/26/2020        She has periodic numbness and burning in feet.    She takes gabapentin as needed for restless legs at night  Last eye exam 2017, reportedly had background retinopathy Has not followed up on this and cannot afford the co-pay   Blood pressure appears to be improving  BP Readings from Last 3 Encounters:  05/27/21 128/76  05/26/21 130/84  01/24/21 130/76     Physical Examination:  BP 128/76   Pulse 95   Ht _0  (1.753 m)   Wt 273 lb (123.8 kg)   SpO2 96%   BMI 40.32 kg/m    ASSESSMENT/PLAN:  Diabetes type 2, insulin requiring with obesity  See history of present illness for detailed discussion of current diabetes management, blood sugar patterns and problems identified   A1c is 6.6 which is the best in 5 years  She has considerably benefited from using Humulin R U-500 insulin and also with adding Ozempic to her basal insulin, metformin and Farxiga regimen No consistent abnormal pattern seen except when she is eating more carbohydrate at breakfast and occasionally at lunch or dinner She is mostly adjusting her mealtime insulin based on Premeal readings than what she is eating and this was discussed She does also need to exercise  Hyperlipidemia: Still has high triglycerides    History of abnormal liver functions probably from hepatic steatosis and now  normal with better blood sugar control  Obesity: She does need further weight loss  Plan:  She will continue 1 mg Ozempic but consider increasing it to 2 mg when this is easily  available  However if weight loss is not consistent may consider Mounjaro  In the meantime she can cut back on her metformin to 1 tablet in the evening to reduce overnight diarrhea Discussed pre and postprandial blood sugar targets Reminded her to walk at least 10 minutes in the mornings regularly for exercise HUMULIN R will be increased to 70 units when she is planning to eat biscuits but can take less when she is eating only peanut butter crackers If eating smaller meals at lunch she can cut back on the lunchtime dose also, likely needs only 55 minutes most of the time No change in Toujeo unless early morning sugars start getting lower Continue to monitor lipids and discussed potential for adding a statin to reduce cardiovascular risk, she is currently reluctant to add another medication; alternatively may add OTC fish oil     Patient Instructions  Walk daily  Adjust humulin based on type of meal  Metformin 1 in pm and 2 in pm    Flu shot given  Elayne Snare 05/28/2021, 10:33 AM   Note: This office note was prepared with Dragon voice rec ognition system technology. Any transcriptional errors that result from this process are unintentional.

## 2021-05-27 NOTE — Patient Instructions (Addendum)
Walk daily  Adjust humulin based on type of meal  Metformin 1 in pm and 2 in pm

## 2021-05-30 ENCOUNTER — Other Ambulatory Visit: Payer: Self-pay

## 2021-05-30 DIAGNOSIS — E1165 Type 2 diabetes mellitus with hyperglycemia: Secondary | ICD-10-CM

## 2021-05-30 MED ORDER — DAPAGLIFLOZIN PROPANEDIOL 10 MG PO TABS: 10.0000 mg | ORAL_TABLET | Freq: Every day | ORAL | 3 refills | Status: DC

## 2021-05-30 MED ORDER — HUMULIN R U-500 KWIKPEN 500 UNIT/ML ~~LOC~~ SOPN
PEN_INJECTOR | SUBCUTANEOUS | 6 refills | Status: DC
Start: 2021-05-30 — End: 2022-02-03

## 2021-05-30 MED ORDER — TOUJEO MAX SOLOSTAR 300 UNIT/ML ~~LOC~~ SOPN
PEN_INJECTOR | SUBCUTANEOUS | 3 refills | Status: DC
Start: 2021-05-30 — End: 2021-10-18

## 2021-05-30 MED ORDER — METFORMIN HCL ER 500 MG PO TB24
ORAL_TABLET | ORAL | 3 refills | Status: DC
Start: 2021-05-30 — End: 2023-12-11

## 2021-05-30 MED ORDER — OZEMPIC (1 MG/DOSE) 4 MG/3ML ~~LOC~~ SOPN: 1.0000 mg | PEN_INJECTOR | SUBCUTANEOUS | 3 refills | Status: DC

## 2021-06-01 ENCOUNTER — Other Ambulatory Visit: Payer: Commercial Managed Care - PPO

## 2021-06-02 ENCOUNTER — Ambulatory Visit: Payer: Commercial Managed Care - PPO | Admitting: Endocrinology

## 2021-06-03 ENCOUNTER — Ambulatory Visit: Payer: Commercial Managed Care - PPO | Admitting: Internal Medicine

## 2021-06-12 ENCOUNTER — Other Ambulatory Visit: Payer: Self-pay | Admitting: Endocrinology

## 2021-09-06 ENCOUNTER — Ambulatory Visit: Payer: BLUE CROSS/BLUE SHIELD | Admitting: Internal Medicine

## 2021-09-12 ENCOUNTER — Telehealth: Payer: Self-pay | Admitting: Endocrinology

## 2021-09-12 NOTE — Telephone Encounter (Signed)
Pt is calling in stating that the pharmacy stated that she needs a prior authorization for Rx insulin regular human CONCENTRATED (HUMULIN R U-500 KWIKPEN)    Pharm:  [facility]

## 2021-09-13 ENCOUNTER — Other Ambulatory Visit (HOSPITAL_COMMUNITY): Payer: Self-pay

## 2021-09-13 NOTE — Telephone Encounter (Signed)
Return reason is a refill too soon. Next payable on 4.1.23.

## 2021-09-13 NOTE — Telephone Encounter (Signed)
Tried reaching out to patient no answer. Left vm to give [place] a callback

## 2021-09-15 ENCOUNTER — Other Ambulatory Visit (HOSPITAL_COMMUNITY): Payer: Self-pay

## 2021-09-18 ENCOUNTER — Other Ambulatory Visit: Payer: Self-pay | Admitting: Endocrinology

## 2021-09-27 ENCOUNTER — Other Ambulatory Visit: Payer: BLUE CROSS/BLUE SHIELD

## 2021-09-28 ENCOUNTER — Other Ambulatory Visit: Payer: BLUE CROSS/BLUE SHIELD

## 2021-09-30 ENCOUNTER — Ambulatory Visit: Payer: BLUE CROSS/BLUE SHIELD | Admitting: Endocrinology

## 2021-10-12 ENCOUNTER — Other Ambulatory Visit (INDEPENDENT_AMBULATORY_CARE_PROVIDER_SITE_OTHER): Payer: Commercial Managed Care - PPO

## 2021-10-12 ENCOUNTER — Other Ambulatory Visit: Payer: Self-pay

## 2021-10-12 DIAGNOSIS — Z794 Long term (current) use of insulin: Secondary | ICD-10-CM

## 2021-10-12 DIAGNOSIS — E1165 Type 2 diabetes mellitus with hyperglycemia: Secondary | ICD-10-CM

## 2021-10-12 DIAGNOSIS — E782 Mixed hyperlipidemia: Secondary | ICD-10-CM | POA: Diagnosis not present

## 2021-10-12 LAB — COMPREHENSIVE METABOLIC PANEL
ALT: 49 U/L — ABNORMAL HIGH (ref 0–35)
AST: 63 U/L — ABNORMAL HIGH (ref 0–37)
Albumin: 4.2 g/dL (ref 3.5–5.2)
Alkaline Phosphatase: 54 U/L (ref 39–117)
BUN: 14 mg/dL (ref 6–23)
CO2: 29 mEq/L (ref 19–32)
Calcium: 9.8 mg/dL (ref 8.4–10.5)
Chloride: 100 mEq/L (ref 96–112)
Creatinine, Ser: 0.72 mg/dL (ref 0.40–1.20)
GFR: 93.81 mL/min (ref >60.00)
Glucose, Bld: 102 mg/dL — ABNORMAL HIGH (ref 70–99)
Potassium: 3.6 mEq/L (ref 3.5–5.1)
Sodium: 136 mEq/L (ref 135–145)
Total Bilirubin: 0.4 mg/dL (ref 0.2–1.2)
Total Protein: 7.4 g/dL (ref 6.0–8.3)

## 2021-10-12 LAB — LIPID PANEL
Cholesterol: 185 mg/dL (ref 0–200)
HDL: 37.9 mg/dL — ABNORMAL LOW (ref >39.00)
LDL Cholesterol: 109 mg/dL — ABNORMAL HIGH (ref 0–99)
NonHDL: 147.49
Total CHOL/HDL Ratio: 5
Triglycerides: 194 mg/dL — ABNORMAL HIGH (ref 0.0–149.0)
VLDL: 38.8 mg/dL (ref 0.0–40.0)

## 2021-10-12 LAB — HEMOGLOBIN A1C: Hgb A1c MFr Bld: 6.9 % — ABNORMAL HIGH (ref 4.6–6.5)

## 2021-10-18 ENCOUNTER — Telehealth: Payer: Self-pay

## 2021-10-18 ENCOUNTER — Other Ambulatory Visit (HOSPITAL_COMMUNITY): Payer: Self-pay

## 2021-10-18 DIAGNOSIS — E1165 Type 2 diabetes mellitus with hyperglycemia: Secondary | ICD-10-CM

## 2021-10-18 DIAGNOSIS — Z794 Long term (current) use of insulin: Secondary | ICD-10-CM

## 2021-10-18 NOTE — Telephone Encounter (Signed)
Patient called and left vm stating she needed a 90 day supply Rx for Toujeo sent to mail order pharmacy. Rx sent. Patient also states she needs PA done on ozempic can we please start PA?

## 2021-10-18 NOTE — Telephone Encounter (Signed)
Patient Advocate Encounter  Prior Authorization for Ozempic 4mg /45ml pen injectors has been approved.    PA# [id number]  Effective dates: 10/18/21 through 10/18/22  Per Test Claim Patients co-pay is $75.   Spoke with Pharmacy to Process.  Patient Advocate Fax: [phone]

## 2021-10-18 NOTE — Progress Notes (Signed)
Patient ID: Pamela Shannon, female   DOB: [birth year], 56 y.o.   MRN: [id number]           Reason for Appointment: Endocrinology follow-up   History of Present Illness:          Diagnosis: Type 2 diabetes mellitus, date of diagnosis: ?  2011       Past history:  At diagnosis she was having symptoms of marked fatigue, dry mouth but not clear how high her sugars were. She has been on various medications for treating her diabetes, probably metformin to start with. Also around the time of diagnosis she thinks she weighed about 300 pounds. She thinks she was able to lose weight subsequently and about 2 years ago had lost down to nearly 200 pounds All the details are not available of her previous management but she appears to have had progressively worsening requiring multiple agents and additional therapies. She thinks her A1c has been over 9% for about 2 years or more, lab records are not available She probably had been on metformin along with     Tanzeum about a year or so ago. Because of inadequate control she was also given Invokamet at some point.  In 9/15 she was switched from Tanzeum to [place] and started on bedtime Levemir insulin, 20 units  She had tried on Ozempic and Trulicity, both stopped because of cost  Recent history:  INSULIN regimen: Toujeo 140 units once daily, Humulin R U-500 60 units AC tid  Non- hypoglycemic drugs the patient is taking are: Metformin ER 1000 mg a.m., 1000 mg p.m., Farxiga 10 mg daily, Ozempic 1 mg weekly   Her A1c is 6.6 which is her best level in 5 years Previously highest 10.8  Current management, blood sugar patterns and problems identified:  She is using her freestyle libre consistently and this appears to be accurate compared to the lab glucose  She has started her back on Ozempic since June and this has improved her control  However has not lost any weight  She was told to take 70 units of Humulin R in the morning but she is still taking 60  units with every meal regardless of what she is eating  However she does try to take her Humulin in the morning on waking up which is an hour before breakfast at least She only will take a little more when she is noticing any higher readings  Only appears to have some hypoglycemia after breakfast and occasionally around lunchtime  Blood sugars are more consistent compared to her last visit and significantly greater time in range  No hypoglycemia also including overnight She occasionally does get diarrhea with metformin although is trying to take 4 tablets a day, this is mostly in the morning  CGM data for the last 2 weeks is interpreted is as follows  Her blood sugars are within the target range on an average at all times  HYPOGLYCEMIA is seen periodically midday which is after breakfast and only occasionally early afternoon or late evening  Overnight blood sugars are generally fairly stable between 118 and 126 but she does have a mild dawn phenomenon with blood sugars at breakfast time averaging nearly 140  POSTPRANDIAL blood sugars are rising the most after breakfast but only about 40 mg and usually staying level after lunch  Postprandial readings after dinner are only minimally higher compared to Premeal readings  Occasionally blood sugar may be low normal before dinnertime but no hypoglycemia  Variability is  less with GV 22 compared to 28 previously  CGM use % of time 92  2-week average/GV 135/22  Time in range       92%  % Time Above 180 8  % Time above 250   % Time Below 70      PRE-MEAL Fasting Lunch Dinner Bedtime Overall  Glucose range:       Averages: 138 140 125     POST-MEAL PC Breakfast PC Lunch PC Dinner  Glucose range:     Averages: 170 144 141   Previously   CGM use % of time  93  2-week average/GV  160/28  Time in range        69%  % Time Above 180  27+4  % Time above 250   % Time Below 70 0     PRE-MEAL Fasting Lunch Dinner Bedtime Overall  Glucose  range:       Averages:  146  171  142   1260   POST-MEAL PC Breakfast PC Lunch PC Dinner  Glucose range:     Averages:  197  177  169       Side effects from medications have been: None    Self-care: The diet that the patient has been following is: Variable    Meals: 2-3 meals per day, evening meal at 5 PM. Breakfast is protein shake, toast or oatmeal, sometimes cereal.    Has snacks with peanut butter crackers            Dietician visit, most recent: 04/21/2020               Weight history: Previous range 200-260  Wt Readings from Last 3 Encounters:  05/27/21 273 lb (123.8 kg)  05/26/21 275 lb (124.7 kg)  01/24/21 275 lb 4 oz (124.9 kg)      Lab Results  Component Value Date   HGBA1C 6.9 (H) 10/12/2021   HGBA1C 6.6 (H) 05/24/2021   HGBA1C 7.4 (H) 01/10/2021   Lab Results  Component Value Date   MICROALBUR 5.5 (H) 01/10/2021   LDLCALC 109 (H) 10/12/2021   CREATININE 0.72 10/12/2021    No visits with results within 1 Week(s) from this visit.  Latest known visit with results is:  Lab on 10/12/2021  Component Date Value Ref Range Status   Cholesterol 10/12/2021 185  0 - 200 mg/dL Final   ATP III Classification       Desirable:  < 200 mg/dL               Borderline High:  200 - 239 mg/dL          High:  > = 240 mg/dL   Triglycerides 10/12/2021 194.0 (H)  0.0 - 149.0 mg/dL Final   Normal:  <150 mg/dLBorderline High:  150 - 199 mg/dL   HDL 10/12/2021 37.90 (L)  >39.00 mg/dL Final   VLDL 10/12/2021 38.8  0.0 - 40.0 mg/dL Final   LDL Cholesterol 10/12/2021 109 (H)  0 - 99 mg/dL Final   Total CHOL/HDL Ratio 10/12/2021 5   Final                  Men          Women1/2 Average Risk     3.4          3.3Average Risk          5.0          4.42X Average Risk  9.6          7.13X Average Risk          15.0          11.0                       NonHDL 10/12/2021 147.49   Final   NOTE:  Non-HDL goal should be 30 mg/dL higher than patient's LDL goal (i.e. LDL goal of < 70  mg/dL, would have non-HDL goal of < 100 mg/dL)   Sodium 10/12/2021 136  135 - 145 mEq/L Final   Potassium 10/12/2021 3.6  3.5 - 5.1 mEq/L Final   Chloride 10/12/2021 100  96 - 112 mEq/L Final   CO2 10/12/2021 29  19 - 32 mEq/L Final   Glucose, Bld 10/12/2021 102 (H)  70 - 99 mg/dL Final   BUN 10/12/2021 14  6 - 23 mg/dL Final   Creatinine, Ser 10/12/2021 0.72  0.40 - 1.20 mg/dL Final   Total Bilirubin 10/12/2021 0.4  0.2 - 1.2 mg/dL Final   Alkaline Phosphatase 10/12/2021 54  39 - 117 U/L Final   AST 10/12/2021 63 (H)  0 - 37 U/L Final   ALT 10/12/2021 49 (H)  0 - 35 U/L Final   Total Protein 10/12/2021 7.4  6.0 - 8.3 g/dL Final   Albumin 10/12/2021 4.2  3.5 - 5.2 g/dL Final   GFR 10/12/2021 93.81  >60.00 mL/min Final   Calculated using the CKD-EPI Creatinine Equation (2021)   Calcium 10/12/2021 9.8  8.4 - 10.5 mg/dL Final   Hgb A1c MFr Bld 10/12/2021 6.9 (H)  4.6 - 6.5 % Final   Glycemic Control Guidelines for People with Diabetes:Non Diabetic:  <6%Goal of Therapy: <7%Additional Action Suggested:  >8%       Allergies as of 10/19/2021       Reactions   Neosporin [bacitracin-polymyxin B] Rash        Medication List        Accurate as of October 18, 2021  9:41 PM. If you have any questions, ask your nurse or doctor.          buPROPion 150 MG 24 hr tablet Commonly known as: WELLBUTRIN XL TAKE 1 TABLET BY MOUTH  TWICE DAILY   dapagliflozin propanediol 10 MG Tabs tablet Commonly known as: Farxiga Take 1 tablet (10 mg total) by mouth daily before breakfast.   fenofibrate 145 MG tablet Commonly known as: TRICOR TAKE 1 TABLET BY MOUTH  DAILY   FreeStyle Libre 2 Sensor Misc APPLY 1 SENSOR EVERY 14 DAYS   gabapentin 300 MG capsule Commonly known as: NEURONTIN TAKE 1 CAPSULE BY MOUTH AT  BEDTIME   HumuLIN R U-500 KwikPen 500 UNIT/ML KwikPen Generic drug: insulin regular human CONCENTRATED INJECT SUBCUTANEOUSLY 60  UNITS 3 TIMES DAILY 30  MINUTES BEFORE EACH MEAL    hydrOXYzine 10 MG tablet Commonly known as: ATARAX Take 1 tablet (10 mg total) by mouth at bedtime as needed for anxiety.   levonorgestrel 20 MCG/24HR IUD Commonly known as: MIRENA 1 each by Intrauterine route once.   metFORMIN 500 MG 24 hr tablet Commonly known as: GLUCOPHAGE-XR TAKE 4 TABLETS BY MOUTH  ONCE DAILY WITH SUPPER   MULTIVITAMIN ADULT PO Take 1 tablet by mouth daily.   OneTouch Delica Lancets 62Z Misc 1 each by Does not apply route 3 (three) times daily. E11.9   OneTouch Verio Flex System w/Device Kit 1 each by Does not apply route  3 (three) times daily. E11.9   OneTouch Verio test strip Generic drug: glucose blood 1 each by Other route 3 (three) times daily. E11.9   Ozempic (1 MG/DOSE) 4 MG/3ML Sopn Generic drug: Semaglutide (1 MG/DOSE) Inject 1 mg into the skin once a week.   Toujeo Max SoloStar 300 UNIT/ML Solostar Pen Generic drug: insulin glargine (2 Unit Dial) INJECT SUBCUTANEOUSLY 110  UNITS DAILY   vitamin C 1000 MG tablet Take 1,000 mg by mouth daily.        Allergies:  Allergies  Allergen Reactions   Neosporin [Bacitracin-Polymyxin B] Rash    Past Medical History:  Diagnosis Date   Back pain    Diabetes mellitus     No past surgical history on file.  Family History  Problem Relation Age of Onset   Diabetes Father    Diabetes Mother    Fibromyalgia Mother    Heart disease Maternal Grandmother    Gout Maternal Grandmother    Heart attack Maternal Grandfather     Social History:  reports that she has quit smoking. She has never used smokeless tobacco. She reports that she does not drink alcohol and does not use drugs.    Review of Systems    She on Wellbutrin 300 mg SR prescribed for improving her appetite and mood         Lipids: Has tendency to high triglycerides, currently taking only fenofibrate      Lab Results  Component Value Date   CHOL 185 10/12/2021   HDL 37.90 (L) 10/12/2021   LDLCALC 109 (H) 10/12/2021    LDLDIRECT 96.0 05/24/2021   TRIG 194.0 (H) 10/12/2021   CHOLHDL 5 10/12/2021               Has had persistently abnormal liver functions, possibly from fatty liver, now normal  Lab Results  Component Value Date   ALT 49 (H) 10/12/2021   ALT 33 05/24/2021   ALT 45 (H) 10/11/2020        She has periodic numbness and burning in feet.    She takes gabapentin as needed for restless legs at night  Last eye exam 2017, reportedly had background retinopathy Has not followed up on this and cannot afford the co-pay   Blood pressure appears to be improving  BP Readings from Last 3 Encounters:  05/27/21 128/76  05/26/21 130/84  01/24/21 130/76     Physical Examination:  There were no vitals taken for this visit.   ASSESSMENT/PLAN:  Diabetes type 2, insulin requiring with obesity  See history of present illness for detailed discussion of current diabetes management, blood sugar patterns and problems identified   A1c is 6.6 which is the best in 5 years  She has considerably benefited from using Humulin R U-500 insulin and also with adding Ozempic to her basal insulin, metformin and Farxiga regimen No consistent abnormal pattern seen except when she is eating more carbohydrate at breakfast and occasionally at lunch or dinner She is mostly adjusting her mealtime insulin based on Premeal readings than what she is eating and this was discussed She does also need to exercise  Hyperlipidemia: Still has high triglycerides    History of abnormal liver functions probably from hepatic steatosis and now normal with better blood sugar control  Obesity: She does need further weight loss  Plan:  She will continue 1 mg Ozempic but consider increasing it to 2 mg when this is easily available  However if weight loss is not  consistent may consider Mounjaro  In the meantime she can cut back on her metformin to 1 tablet in the evening to reduce overnight diarrhea Discussed pre and  postprandial blood sugar targets Reminded her to walk at least 10 minutes in the mornings regularly for exercise HUMULIN R will be increased to 70 units when she is planning to eat biscuits but can take less when she is eating only peanut butter crackers If eating smaller meals at lunch she can cut back on the lunchtime dose also, likely needs only 55 minutes most of the time No change in Toujeo unless early morning sugars start getting lower Continue to monitor lipids and discussed potential for adding a statin to reduce cardiovascular risk, she is currently reluctant to add another medication; alternatively may add OTC fish oil     There are no Patient Instructions on file for this visit.    Flu shot given  Elayne Snare 10/18/2021, 9:41 PM   Note: This office note was prepared with Dragon voice rec ognition system technology. Any transcriptional errors that result from this process are unintentional.

## 2021-10-18 NOTE — Telephone Encounter (Signed)
Patient Advocate Encounter   Received notification from patient calls that prior authorization for Ozempic 4mg /81ml pen injectors is required by his/her insurance [employer].   PA submitted on 10/18/21  Key#: [id number]  Status is pending    [facility] will continue to follow:  Patient Advocate Fax: [phone]

## 2021-10-19 ENCOUNTER — Other Ambulatory Visit: Payer: Self-pay

## 2021-10-19 ENCOUNTER — Encounter: Payer: Self-pay | Admitting: Endocrinology

## 2021-10-19 ENCOUNTER — Ambulatory Visit: Payer: Commercial Managed Care - PPO | Admitting: Endocrinology

## 2021-10-19 VITALS — BP 120/78 | HR 93 | Ht 69.0 in | Wt 274.0 lb

## 2021-10-19 DIAGNOSIS — E782 Mixed hyperlipidemia: Secondary | ICD-10-CM

## 2021-10-19 DIAGNOSIS — Z794 Long term (current) use of insulin: Secondary | ICD-10-CM | POA: Diagnosis not present

## 2021-10-19 DIAGNOSIS — E1165 Type 2 diabetes mellitus with hyperglycemia: Secondary | ICD-10-CM

## 2021-10-19 DIAGNOSIS — R945 Abnormal results of liver function studies: Secondary | ICD-10-CM

## 2021-10-19 MED ORDER — OZEMPIC (2 MG/DOSE) 8 MG/3ML ~~LOC~~ SOPN: PEN_INJECTOR | SUBCUTANEOUS | 1 refills | Status: DC

## 2021-10-19 MED ORDER — DEXCOM G7 SENSOR MISC: 1.0000 | 3 refills | Status: DC

## 2021-10-19 MED ORDER — ATORVASTATIN CALCIUM 10 MG PO TABS: 10.0000 mg | ORAL_TABLET | Freq: Every day | ORAL | 3 refills | Status: AC

## 2021-10-19 NOTE — Progress Notes (Signed)
Patient ID: Pamela Shannon, female   DOB: 10/01/1965, 56 y.o.   MRN: [id number]           Reason for Appointment: Endocrinology follow-up   History of Present Illness:          Diagnosis: Type 2 diabetes mellitus, date of diagnosis: ?  2011       Past history:  At diagnosis she was having symptoms of marked fatigue, dry mouth but not clear how high her sugars were. She has been on various medications for treating her diabetes, probably metformin to start with. Also around the time of diagnosis she thinks she weighed about 300 pounds. She thinks she was able to lose weight subsequently and about 2 years ago had lost down to nearly 200 pounds All the details are not available of her previous management but she appears to have had progressively worsening requiring multiple agents and additional therapies. She thinks her A1c has been over 9% for about 2 years or more, lab records are not available She probably had been on metformin along with     Tanzeum about a year or so ago. Because of inadequate control she was also given Invokamet at some point.  In 9/15 she was switched from Tanzeum to [place] and started on bedtime Levemir insulin, 20 units  She had tried on Ozempic and Trulicity, both stopped because of cost  Recent history:  INSULIN regimen: Toujeo 120 units once daily, Humulin R U-500 70 units-60-60C tid  Non- hypoglycemic drugs the patient is taking are: Metformin ER 1000 mg a.m., 1000 mg p.m., Farxiga 10 mg daily, Ozempic 1 mg weekly   Her A1c is 6.9 compared to 6.6  Previously highest 10.8  Current management, blood sugar patterns and problems identified:  She is using her freestyle libre more recently and did not have at all until 2/22  She has a significant dawn phenomenon when also rising blood sugar in the mornings not always related to meals  On the day she had her labs she took her insulin but did not eat and had a low sugar episode before she came in to the lab  She  is not eating planned meals and mostly eating peanut butter crackers at breakfast  Only occasionally when she is eating a large amount of carbohydrate at lunch or dinner will blood sugars go up significantly  With increasing her Humulin R she was able to reduce her Toujeo by 20 units  She is able to take Ozempic consistently now without nausea However has difficulty losing weight She says that she is too tired after work to exercise  Her blood sugar time in range recently is only 69% compared to 92 previously  CGM data for the last 2 weeks is interpreted is as follows  Generally blood sugars on an average are at the upper end of the normal range However blood sugars do seem to be higher around 6-8 AM fairly consistently HYPERGLYCEMIC episodes are seen mostly after breakfast but periodically after lunch and dinner Generally postprandial readings are well controlled but tend to rise only slowly after breakfast or lunch HYPOGLYCEMIA is minimal with only 1 episode at 1 AM Overnight blood sugars are trending down after 9 PM and lowest around midnight averaging about 120 with mild variability She has significant dawn phenomenon with blood sugars rising early morning before 3 AM when she wakes up  Fasting blood sugars when she checks them are basically increased over 140 but her lower prior to  her waking up   CGM use % of time 56  2-week average/GV 161  Time in range    69    %  % Time Above 180 28+3  % Time above 250   % Time Below 70 0     PRE-MEAL Fasting Lunch Dinner Bedtime Overall  Glucose range:       Averages: 133 144 155     POST-MEAL PC Breakfast PC Lunch PC Dinner  Glucose range:     Averages: 196 175 171   Previously  CGM use % of time 92  2-week average/GV 135/22  Time in range       92%  % Time Above 180 8  % Time above 250   % Time Below 70      PRE-MEAL Fasting Lunch Dinner Bedtime Overall  Glucose range:       Averages: 138 140 125     POST-MEAL PC  Breakfast PC Lunch PC Dinner  Glucose range:     Averages: 170 144 141        Side effects from medications have been: None    Self-care: The diet that the patient has been following is: Variable    Meals: 2-3 meals per day, evening meal at 5 PM. Breakfast is protein shake, toast or oatmeal, sometimes cereal.    Has snacks with peanut butter crackers            Dietician visit, most recent: 04/21/2020               Weight history: Previous range 200-260  Wt Readings from Last 3 Encounters:  10/19/21 274 lb (124.3 kg)  05/27/21 273 lb (123.8 kg)  05/26/21 275 lb (124.7 kg)      Lab Results  Component Value Date   HGBA1C 6.9 (H) 10/12/2021   HGBA1C 6.6 (H) 05/24/2021   HGBA1C 7.4 (H) 01/10/2021   Lab Results  Component Value Date   MICROALBUR 5.5 (H) 01/10/2021   LDLCALC 109 (H) 10/12/2021   CREATININE 0.72 10/12/2021    No visits with results within 1 Week(s) from this visit.  Latest known visit with results is:  Lab on 10/12/2021  Component Date Value Ref Range Status   Cholesterol 10/12/2021 185  0 - 200 mg/dL Final   ATP III Classification       Desirable:  < 200 mg/dL               Borderline High:  200 - 239 mg/dL          High:  > = 240 mg/dL   Triglycerides 10/12/2021 194.0 (H)  0.0 - 149.0 mg/dL Final   Normal:  <150 mg/dLBorderline High:  150 - 199 mg/dL   HDL 10/12/2021 37.90 (L)  >39.00 mg/dL Final   VLDL 10/12/2021 38.8  0.0 - 40.0 mg/dL Final   LDL Cholesterol 10/12/2021 109 (H)  0 - 99 mg/dL Final   Total CHOL/HDL Ratio 10/12/2021 5   Final                  Men          Women1/2 Average Risk     3.4          3.3Average Risk          5.0          4.42X Average Risk          9.6  7.13X Average Risk          15.0          11.0                       NonHDL 10/12/2021 147.49   Final   NOTE:  Non-HDL goal should be 30 mg/dL higher than patient's LDL goal (i.e. LDL goal of < 70 mg/dL, would have non-HDL goal of < 100 mg/dL)   Sodium 10/12/2021 136   135 - 145 mEq/L Final   Potassium 10/12/2021 3.6  3.5 - 5.1 mEq/L Final   Chloride 10/12/2021 100  96 - 112 mEq/L Final   CO2 10/12/2021 29  19 - 32 mEq/L Final   Glucose, Bld 10/12/2021 102 (H)  70 - 99 mg/dL Final   BUN 10/12/2021 14  6 - 23 mg/dL Final   Creatinine, Ser 10/12/2021 0.72  0.40 - 1.20 mg/dL Final   Total Bilirubin 10/12/2021 0.4  0.2 - 1.2 mg/dL Final   Alkaline Phosphatase 10/12/2021 54  39 - 117 U/L Final   AST 10/12/2021 63 (H)  0 - 37 U/L Final   ALT 10/12/2021 49 (H)  0 - 35 U/L Final   Total Protein 10/12/2021 7.4  6.0 - 8.3 g/dL Final   Albumin 10/12/2021 4.2  3.5 - 5.2 g/dL Final   GFR 10/12/2021 93.81  >60.00 mL/min Final   Calculated using the CKD-EPI Creatinine Equation (2021)   Calcium 10/12/2021 9.8  8.4 - 10.5 mg/dL Final   Hgb A1c MFr Bld 10/12/2021 6.9 (H)  4.6 - 6.5 % Final   Glycemic Control Guidelines for People with Diabetes:Non Diabetic:  <6%Goal of Therapy: <7%Additional Action Suggested:  >8%       Allergies as of 10/19/2021       Reactions   Neosporin [bacitracin-polymyxin B] Rash        Medication List        Accurate as of October 19, 2021 11:58 AM. If you have any questions, ask your nurse or doctor.          STOP taking these medications    Ozempic (1 MG/DOSE) 4 MG/3ML Sopn Generic drug: Semaglutide (1 MG/DOSE) Replaced by: Ozempic (2 MG/DOSE) 8 MG/3ML Sopn Stopped by: Elayne Snare, MD       TAKE these medications    buPROPion 150 MG 24 hr tablet Commonly known as: WELLBUTRIN XL TAKE 1 TABLET BY MOUTH  TWICE DAILY   dapagliflozin propanediol 10 MG Tabs tablet Commonly known as: Farxiga Take 1 tablet (10 mg total) by mouth daily before breakfast.   fenofibrate 145 MG tablet Commonly known as: TRICOR TAKE 1 TABLET BY MOUTH  DAILY   FreeStyle Libre 2 Sensor Misc APPLY 1 SENSOR EVERY 14 DAYS   gabapentin 300 MG capsule Commonly known as: NEURONTIN TAKE 1 CAPSULE BY MOUTH AT  BEDTIME   HumuLIN R U-500 KwikPen 500  UNIT/ML KwikPen Generic drug: insulin regular human CONCENTRATED INJECT SUBCUTANEOUSLY 60  UNITS 3 TIMES DAILY 30  MINUTES BEFORE EACH MEAL   hydrOXYzine 10 MG tablet Commonly known as: ATARAX Take 1 tablet (10 mg total) by mouth at bedtime as needed for anxiety.   levonorgestrel 20 MCG/24HR IUD Commonly known as: MIRENA 1 each by Intrauterine route once.   metFORMIN 500 MG 24 hr tablet Commonly known as: GLUCOPHAGE-XR TAKE 4 TABLETS BY MOUTH  ONCE DAILY WITH SUPPER   MULTIVITAMIN ADULT PO Take 1 tablet by mouth daily.  OneTouch Delica Lancets 11H Misc 1 each by Does not apply route 3 (three) times daily. E11.9   OneTouch Verio Flex System w/Device Kit 1 each by Does not apply route 3 (three) times daily. E11.9   OneTouch Verio test strip Generic drug: glucose blood 1 each by Other route 3 (three) times daily. E11.9   Ozempic (2 MG/DOSE) 8 MG/3ML Sopn Generic drug: Semaglutide (2 MG/DOSE) Inject 2 mg weekly Replaces: Ozempic (1 MG/DOSE) 4 MG/3ML Sopn Started by: Elayne Snare, MD   Toujeo Max SoloStar 300 UNIT/ML Solostar Pen Generic drug: insulin glargine (2 Unit Dial) INJECT SUBCUTANEOUSLY 110  UNITS DAILY   vitamin C 1000 MG tablet Take 1,000 mg by mouth daily.        Allergies:  Allergies  Allergen Reactions   Neosporin [Bacitracin-Polymyxin B] Rash    Past Medical History:  Diagnosis Date   Back pain    Diabetes mellitus     No past surgical history on file.  Family History  Problem Relation Age of Onset   Diabetes Father    Diabetes Mother    Fibromyalgia Mother    Heart disease Maternal Grandmother    Gout Maternal Grandmother    Heart attack Maternal Grandfather     Social History:  reports that she has quit smoking. She has never used smokeless tobacco. She reports that she does not drink alcohol and does not use drugs.    Review of Systems    She on Wellbutrin 300 mg SR prescribed for improving her appetite and mood         Lipids:  Has tendency to high triglycerides, currently taking only fenofibrate      Lab Results  Component Value Date   CHOL 185 10/12/2021   CHOL 162 05/24/2021   CHOL 178 10/11/2020   Lab Results  Component Value Date   HDL 37.90 (L) 10/12/2021   HDL 35.40 (L) 05/24/2021   HDL 34.50 (L) 10/11/2020   Lab Results  Component Value Date   LDLCALC 109 (H) 10/12/2021   LDLCALC 89 01/31/2016   LDLCALC 93 07/05/2015   Lab Results  Component Value Date   TRIG 194.0 (H) 10/12/2021   TRIG 224.0 (H) 05/24/2021   TRIG 257.0 (H) 10/11/2020   Lab Results  Component Value Date   CHOLHDL 5 10/12/2021   CHOLHDL 5 05/24/2021   CHOLHDL 5 10/11/2020   Lab Results  Component Value Date   LDLDIRECT 96.0 05/24/2021   LDLDIRECT 127.0 01/10/2021   LDLDIRECT 110.0 10/11/2020                Has had persistently abnormal liver functions, possibly from fatty liver  Lab Results  Component Value Date   ALT 49 (H) 10/12/2021   ALT 33 05/24/2021   ALT 45 (H) 10/11/2020        She has periodic numbness and burning in feet.    She takes gabapentin as needed for restless legs at night  Last eye exam 2017, reportedly had background retinopathy Has not followed up on this and cannot afford the co-pay   Blood pressure is now consistently normal  BP Readings from Last 3 Encounters:  10/19/21 120/78  05/27/21 128/76  05/26/21 130/84     Physical Examination:  BP 120/78    Pulse 93    Ht _0  (1.753 m)    Wt 274 lb (124.3 kg)    SpO2 94%    BMI 40.46 kg/m    ASSESSMENT/PLAN:  Diabetes  type 2, insulin requiring with obesity  See history of present illness for detailed discussion of current diabetes management, blood sugar patterns and problems identified   A1c is 6.9 all  She is doing fair with her blood sugar level of control her glucose is recently trending higher with average 166 and blood sugars above target 31% of the time This is mostly related to dawn phenomenon but also some  rise in blood sugars after meals However even though she has difficulty getting consistent control with Humulin R U-500 insulin this has helped her level of control and A1c better than previous regimen Likely also benefiting from [place] However it does not exercise  HYPERCHOLESTEROLEMIA: Her LDL is 109 and needs statin therapy both for lipid lowering and risk reduction  History of abnormal liver functions probably from hepatic steatosis  Needs weight loss  Plan:  She will go up to 2 mg Ozempic  She wants to try the [place] sensor and this was prescribed, she will have the nurse educator help her if she needs starting education She will take at least 75 units of Humulin R in the morning on waking up as soon as she wakes up and 80 units if blood sugars are high For now we will reduce her supper dose as a precaution to 55 units  if she has high readings after breakfast she may need to go up further on the morning dose Encouraged her to have more balanced meals with some protein consistently Also if her fasting readings are starting to get low or overnight will reduce her Toujeo further Encouraged her to start exercising  Discussed need for weight loss with having continued abnormal liver functions She will also discuss evaluation of liver functions with PCP   She will start 10 mg Lipitor for hypercholesterolemia  Patient Instructions  R: 75-80 units on waking up daily  Supper dose 55 units     Jamyrah Saur 10/19/2021, 11:58 AM   Note: This office note was prepared with Dragon voice rec ognition system technology. Any transcriptional errors that result from this process are unintentional.

## 2021-10-19 NOTE — Patient Instructions (Addendum)
R: 75-80 units on waking up daily ? ?Supper dose 55 units ? ?May use CoQ10 if muscle aches ?

## 2021-10-21 ENCOUNTER — Telehealth: Payer: Self-pay

## 2021-10-21 ENCOUNTER — Other Ambulatory Visit (HOSPITAL_COMMUNITY): Payer: Self-pay

## 2021-10-21 ENCOUNTER — Encounter: Payer: Self-pay | Admitting: Endocrinology

## 2021-10-21 MED ORDER — TOUJEO MAX SOLOSTAR 300 UNIT/ML ~~LOC~~ SOPN: PEN_INJECTOR | SUBCUTANEOUS | 3 refills | Status: DC

## 2021-10-21 NOTE — Telephone Encounter (Signed)
Patient Advocate Encounter ? ?Prior Authorization for Dexcom G7 sensors has been approved.   ? ?PA# [id number] ? ?Effective dates: 10/21/21 through 10/22/22 ? ?Per Test Claim Patients co-pay is $40.  ? ?Spoke with Pharmacy to Process. ? ?Patient Advocate ?Fax: [phone]  ?

## 2021-10-21 NOTE — Telephone Encounter (Signed)
Patient Advocate Encounter ?  ?Received notification from [facility] that prior authorization for Dexcom G7 sensors is required by his/her insurance [employer]. ?  ?PA submitted on 10/21/21 ? ?Key#: [id number] ? ?Status is pending ?   ?[facility] will continue to follow: ? ?Patient Advocate ?Fax: [phone]  ?

## 2021-11-08 ENCOUNTER — Encounter: Payer: Commercial Managed Care - PPO | Attending: Endocrinology | Admitting: Nutrition

## 2021-11-08 ENCOUNTER — Other Ambulatory Visit: Payer: Self-pay

## 2021-11-08 DIAGNOSIS — E1165 Type 2 diabetes mellitus with hyperglycemia: Secondary | ICD-10-CM | POA: Insufficient documentation

## 2021-11-08 NOTE — Patient Instructions (Signed)
Replace sensor every 10 days.   ?Call 800 help line if questions or problems ?

## 2021-11-08 NOTE — Progress Notes (Signed)
Patient was trained on the Dexcom G7 sensor.  We set up her Dexcom and clarity apps and the accounts were linked to [place] [facility].  She says she started this sensor last week, but would not work after 1 day.  She was told how to get a new sensor and we put a overtape on her sensor to make sure it does not move and she will call me if this one does not work.  The sensor was started at 11:30 and has a 30 minute warm up period.  She was give the 800 help line number to call to replace her old sensor and if she has trouble with this one.   ?She had no final questions.   ?

## 2021-11-23 ENCOUNTER — Other Ambulatory Visit: Payer: Self-pay | Admitting: Endocrinology

## 2021-11-30 ENCOUNTER — Telehealth: Payer: Self-pay | Admitting: Endocrinology

## 2021-11-30 NOTE — Telephone Encounter (Signed)
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 She can pick it up tomorrow on her lunch hour @ 1000 AM. ? ?Patients number is [phone] - please communicate with her regarding completion and pick up  ?

## 2021-11-30 NOTE — Telephone Encounter (Signed)
Paperwork received and placed on providers desk for completion. Also attempted to contact the patient to inform her that Dr. Lucianne Muss is currently out of office and will be in tomorrow LVM.  ?

## 2021-12-01 NOTE — Telephone Encounter (Signed)
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  ?

## 2021-12-05 NOTE — Telephone Encounter (Signed)
Called patient to let her know paperwork ready to be picked up. No answer. Left vm for patient ?

## 2021-12-05 NOTE — Telephone Encounter (Signed)
Patient picked up DOT paperwork @ 242 PM ?

## 2021-12-16 ENCOUNTER — Encounter: Payer: Self-pay | Admitting: Internal Medicine

## 2021-12-16 ENCOUNTER — Ambulatory Visit: Payer: Commercial Managed Care - PPO | Admitting: Internal Medicine

## 2021-12-16 VITALS — BP 124/84 | HR 94 | Resp 18 | Ht 69.0 in | Wt 266.2 lb

## 2021-12-16 DIAGNOSIS — R1013 Epigastric pain: Secondary | ICD-10-CM | POA: Insufficient documentation

## 2021-12-16 DIAGNOSIS — R197 Diarrhea, unspecified: Secondary | ICD-10-CM | POA: Diagnosis not present

## 2021-12-16 DIAGNOSIS — E118 Type 2 diabetes mellitus with unspecified complications: Secondary | ICD-10-CM

## 2021-12-16 DIAGNOSIS — R195 Other fecal abnormalities: Secondary | ICD-10-CM | POA: Diagnosis not present

## 2021-12-16 DIAGNOSIS — M898X1 Other specified disorders of bone, shoulder: Secondary | ICD-10-CM | POA: Diagnosis not present

## 2021-12-16 DIAGNOSIS — G8929 Other chronic pain: Secondary | ICD-10-CM | POA: Insufficient documentation

## 2021-12-16 LAB — COMPREHENSIVE METABOLIC PANEL
ALT: 46 U/L — ABNORMAL HIGH (ref 0–35)
AST: 50 U/L — ABNORMAL HIGH (ref 0–37)
Albumin: 4.1 g/dL (ref 3.5–5.2)
Alkaline Phosphatase: 64 U/L (ref 39–117)
BUN: 18 mg/dL (ref 6–23)
CO2: 26 mEq/L (ref 19–32)
Calcium: 9.3 mg/dL (ref 8.4–10.5)
Chloride: 101 mEq/L (ref 96–112)
Creatinine, Ser: 0.98 mg/dL (ref 0.40–1.20)
GFR: 64.72 mL/min (ref >60.00)
Glucose, Bld: 222 mg/dL — ABNORMAL HIGH (ref 70–99)
Potassium: 4.1 mEq/L (ref 3.5–5.1)
Sodium: 135 mEq/L (ref 135–145)
Total Bilirubin: 0.4 mg/dL (ref 0.2–1.2)
Total Protein: 7.7 g/dL (ref 6.0–8.3)

## 2021-12-16 LAB — CBC
HCT: 42.8 % (ref 36.0–46.0)
Hemoglobin: 14.7 g/dL (ref 12.0–15.0)
MCHC: 34.3 g/dL (ref 30.0–36.0)
MCV: 89.1 fl (ref 78.0–100.0)
Platelets: 275 10*3/uL (ref 150.0–400.0)
RBC: 4.8 Mil/uL (ref 3.87–5.11)
RDW: 14.6 % (ref 11.5–15.5)
WBC: 11.2 10*3/uL — ABNORMAL HIGH (ref 4.0–10.5)

## 2021-12-16 LAB — LIPASE: Lipase: 52 U/L (ref 11.0–59.0)

## 2021-12-16 MED ORDER — CYCLOBENZAPRINE HCL 5 MG PO TABS: 5.0000 mg | ORAL_TABLET | Freq: Every day | ORAL | 1 refills | Status: DC

## 2021-12-16 MED ORDER — PANTOPRAZOLE SODIUM 40 MG PO TBEC: 40.0000 mg | DELAYED_RELEASE_TABLET | Freq: Two times a day (BID) | ORAL | 3 refills | Status: DC

## 2021-12-16 NOTE — Progress Notes (Signed)
? ?  Subjective:  ? ?Patient ID: Pamela Shannon, female    DOB: 02-07-1966, 56 y.o.   MRN: [id number] ? ?HPI ?The patient is a 55 YO female coming in for stomach pain, heartburn and dark stools going on 1-2 weeks. Taking a lot of tums/maalox/pepto bismol in the last week or two. Not improving. Some diarrhea, vomiting and pain along.  ? ?Review of Systems  ?Constitutional:  Positive for activity change and appetite change.  ?HENT: Negative.    ?Eyes: Negative.   ?Respiratory:  Negative for cough, chest tightness and shortness of breath.   ?Cardiovascular:  Negative for chest pain, palpitations and leg swelling.  ?Gastrointestinal:  Positive for abdominal distention, abdominal pain, constipation, diarrhea, nausea and vomiting. Negative for anal bleeding, blood in stool and rectal pain.  ?     Dark stools  ?Musculoskeletal: Negative.   ?Skin: Negative.   ?Neurological: Negative.   ?Psychiatric/Behavioral: Negative.    ? ?Objective:  ?Physical Exam ?Constitutional:   ?   Appearance: She is well-developed. She is obese.  ?HENT:  ?   Head: Normocephalic and atraumatic.  ?Cardiovascular:  ?   Rate and Rhythm: Normal rate and regular rhythm.  ?Pulmonary:  ?   Effort: Pulmonary effort is normal. No respiratory distress.  ?   Breath sounds: Normal breath sounds. No wheezing or rales.  ?Abdominal:  ?   General: Bowel sounds are normal. There is no distension.  ?   Palpations: Abdomen is soft.  ?   Tenderness: There is abdominal tenderness. There is no rebound.  ?   Comments: Minimal tenderness upper abdomen, distention versus habitus hard to distinguish  ?Musculoskeletal:  ?   Cervical back: Normal range of motion.  ?Skin: ?   General: Skin is warm and dry.  ?Neurological:  ?   Mental Status: She is alert and oriented to person, place, and time.  ?   Coordination: Coordination normal.  ? ? ?Vitals:  ? 12/16/21 1029  ?BP: 124/84  ?Pulse: 94  ?Resp: 18  ?SpO2: 98%  ?Weight: 266 lb 3.2 oz (120.7 kg)  ?Height: 5\' 9"  (1.753 m)  ? ? ?This  visit occurred during the SARS-CoV-2 public health emergency.  Safety protocols were in place, including screening questions prior to the visit, additional usage of staff PPE, and extensive cleaning of exam room while observing appropriate contact time as indicated for disinfecting solutions.  ? ?Assessment & Plan:  ? ?

## 2021-12-16 NOTE — Assessment & Plan Note (Signed)
Concern for blood in stool versus pepto bismol color change. Checking CBC. She has no past colonoscopy and does not want this unless needed. If no resolution with treatment of protonix she will consider. Depending on lab results may need sooner. ?

## 2021-12-16 NOTE — Assessment & Plan Note (Signed)
Checking CBC, CMP, lipase. Rx protonix 40 mg BID in case of ulcer/gastritis.  ?

## 2021-12-16 NOTE — Patient Instructions (Addendum)
We will check some labs today.  ? ?We have sent in protonix which is a stomach medicine to take 1 pill twice a day for 1 month. Let us know in 1 week how you are doing. ? ?It is okay to use tums or maalox or pepto bismol. ? ? ?

## 2021-12-16 NOTE — Assessment & Plan Note (Signed)
Unclear cause. Not present currently and possible that overuse of tums/maalox could have contributed.  ?

## 2021-12-16 NOTE — Assessment & Plan Note (Signed)
Rx flexeril 5 mg qhs to try for the pain. On exam shoulder normal ROM and non tender at left AC joint.  ?

## 2021-12-16 NOTE — Assessment & Plan Note (Addendum)
She is seeing endo and has made great progress on control in the last year. Most recent HgA1c 6.9 which is at goal. She will continue on current regimen farxiga 10 mg daily and toujeo 110 units daily and humulin 60 units TID with meals and metformin 2000 mg daily and ozempic 2 mg weekly. Will need to monitor sugars more often due to concurrent illness. No low sugars recently. Make sure to get plenty of fluids but avoid overly sugary fluids such as juice or gatorade and instead water and diluted gatorade okay.  ?

## 2021-12-21 ENCOUNTER — Other Ambulatory Visit: Payer: Self-pay | Admitting: Endocrinology

## 2022-01-03 ENCOUNTER — Telehealth: Payer: Self-pay | Admitting: Internal Medicine

## 2022-01-03 NOTE — Telephone Encounter (Signed)
Patient called in and states that insurance needs a PA for Pantoprazole medication.  ? ?Please send ASAP. ?

## 2022-01-04 NOTE — Telephone Encounter (Signed)
Rec'd msg back from insurance stating This request has received a Favorable outcome. Effective 01/04/2022 - 01/05/2023. Will fax approval to [place].Marland KitchenJohny Shannon ? ? ?

## 2022-01-04 NOTE — Telephone Encounter (Signed)
Submitted PA on cover-my-meds w/ (Key: [id number]). Rec'd msg stating " [employer] is reviewing your PA request. Typically an electronic response will be received within 24-72 hours. "../lmb ?

## 2022-02-03 ENCOUNTER — Other Ambulatory Visit: Payer: Self-pay | Admitting: Endocrinology

## 2022-02-03 DIAGNOSIS — Z794 Long term (current) use of insulin: Secondary | ICD-10-CM

## 2022-02-04 ENCOUNTER — Other Ambulatory Visit: Payer: Self-pay | Admitting: Endocrinology

## 2022-02-04 DIAGNOSIS — E1165 Type 2 diabetes mellitus with hyperglycemia: Secondary | ICD-10-CM

## 2022-02-06 ENCOUNTER — Other Ambulatory Visit: Payer: Self-pay | Admitting: Endocrinology

## 2022-02-28 ENCOUNTER — Other Ambulatory Visit: Payer: Commercial Managed Care - PPO

## 2022-03-03 ENCOUNTER — Other Ambulatory Visit: Payer: Self-pay | Admitting: Endocrinology

## 2022-03-03 ENCOUNTER — Ambulatory Visit: Payer: Commercial Managed Care - PPO | Admitting: Endocrinology

## 2022-03-04 ENCOUNTER — Other Ambulatory Visit: Payer: Self-pay | Admitting: Endocrinology

## 2022-03-04 DIAGNOSIS — E1165 Type 2 diabetes mellitus with hyperglycemia: Secondary | ICD-10-CM

## 2022-03-06 ENCOUNTER — Telehealth: Payer: Self-pay

## 2022-03-06 NOTE — Telephone Encounter (Signed)
Is that patient doing 120 units or 110 units of the Toujeo?

## 2022-03-07 ENCOUNTER — Other Ambulatory Visit: Payer: Self-pay | Admitting: Endocrinology

## 2022-03-07 ENCOUNTER — Other Ambulatory Visit: Payer: Self-pay

## 2022-04-03 ENCOUNTER — Other Ambulatory Visit: Payer: Self-pay | Admitting: Endocrinology

## 2022-05-23 ENCOUNTER — Ambulatory Visit: Payer: Commercial Managed Care - PPO | Admitting: Internal Medicine

## 2022-05-23 ENCOUNTER — Encounter: Payer: Self-pay | Admitting: Internal Medicine

## 2022-05-23 VITALS — BP 130/78 | HR 82 | Temp 97.6°F | Ht 69.0 in | Wt 274.0 lb

## 2022-05-23 DIAGNOSIS — E118 Type 2 diabetes mellitus with unspecified complications: Secondary | ICD-10-CM

## 2022-05-23 DIAGNOSIS — M546 Pain in thoracic spine: Secondary | ICD-10-CM | POA: Diagnosis not present

## 2022-05-23 DIAGNOSIS — E1169 Type 2 diabetes mellitus with other specified complication: Secondary | ICD-10-CM

## 2022-05-23 MED ORDER — IBUPROFEN 800 MG PO TABS: 800.0000 mg | ORAL_TABLET | Freq: Three times a day (TID) | ORAL | 0 refills | Status: DC | PRN

## 2022-05-23 MED ORDER — PREDNISONE 20 MG PO TABS: 40.0000 mg | ORAL_TABLET | Freq: Every day | ORAL | 0 refills | Status: DC

## 2022-05-23 NOTE — Patient Instructions (Addendum)
We will send in prednisone to take 2 pills daily for 5 days.  We have sent in the ibuprofen 800 mg to use for the pain.

## 2022-05-23 NOTE — Progress Notes (Signed)
   Subjective:   Patient ID: Pamela Shannon, female    DOB: 05/16/1966, 56 y.o.   MRN: [id number]  HPI The patient is a 56 YO female coming in for l arm pain and upper back.  Review of Systems  Constitutional: Negative.   HENT: Negative.    Eyes: Negative.   Respiratory:  Negative for cough, chest tightness and shortness of breath.   Cardiovascular:  Negative for chest pain, palpitations and leg swelling.  Gastrointestinal:  Negative for abdominal distention, abdominal pain, constipation, diarrhea, nausea and vomiting.  Musculoskeletal:  Positive for arthralgias and back pain.  Skin: Negative.   Neurological: Negative.   Psychiatric/Behavioral: Negative.      Objective:  Physical Exam Constitutional:      Appearance: She is well-developed. She is obese.  HENT:     Head: Normocephalic and atraumatic.  Cardiovascular:     Rate and Rhythm: Normal rate and regular rhythm.  Pulmonary:     Effort: Pulmonary effort is normal. No respiratory distress.     Breath sounds: Normal breath sounds. No wheezing or rales.  Abdominal:     General: Bowel sounds are normal. There is no distension.     Palpations: Abdomen is soft.     Tenderness: There is no abdominal tenderness. There is no rebound.  Musculoskeletal:        General: Tenderness present.     Cervical back: Normal range of motion.  Skin:    General: Skin is warm and dry.  Neurological:     Mental Status: She is alert and oriented to person, place, and time.     Coordination: Coordination normal.     Vitals:   05/23/22 1054  BP: 130/78  Pulse: 82  Temp: 97.6 F (36.4 C)  SpO2: 98%  Weight: 274 lb (124.3 kg)  Height: 5\' 9"  (1.753 m)    Assessment & Plan:

## 2022-05-27 DIAGNOSIS — M549 Dorsalgia, unspecified: Secondary | ICD-10-CM | POA: Insufficient documentation

## 2022-05-27 NOTE — Assessment & Plan Note (Signed)
Foot exam done as due. Sees endo for management.

## 2022-05-27 NOTE — Assessment & Plan Note (Signed)
Suspect muscle strain. Rx prednisone burst and ibuprofen. She has flexeril which makes her sedated and she cannot take except at night time.

## 2022-07-07 ENCOUNTER — Encounter: Payer: Self-pay | Admitting: Family Medicine

## 2022-07-07 ENCOUNTER — Ambulatory Visit: Payer: Commercial Managed Care - PPO | Admitting: Family Medicine

## 2022-07-07 VITALS — BP 136/70 | HR 87 | Temp 97.5°F | Ht 69.0 in | Wt 270.0 lb

## 2022-07-07 DIAGNOSIS — H6993 Unspecified Eustachian tube disorder, bilateral: Secondary | ICD-10-CM | POA: Insufficient documentation

## 2022-07-07 DIAGNOSIS — J0100 Acute maxillary sinusitis, unspecified: Secondary | ICD-10-CM | POA: Diagnosis not present

## 2022-07-07 MED ORDER — AMOXICILLIN 875 MG PO TABS: 875.0000 mg | ORAL_TABLET | Freq: Two times a day (BID) | ORAL | 0 refills | Status: AC

## 2022-07-07 MED ORDER — PREDNISONE 20 MG PO TABS: 20.0000 mg | ORAL_TABLET | Freq: Two times a day (BID) | ORAL | 0 refills | Status: AC

## 2022-07-07 MED ORDER — IBUPROFEN 800 MG PO TABS: 800.0000 mg | ORAL_TABLET | Freq: Three times a day (TID) | ORAL | 0 refills | Status: AC | PRN

## 2022-07-07 NOTE — Progress Notes (Signed)
New Patient Office Visit  Subjective    Patient ID: Avaya Mcjunkins, female    DOB: [birth year]  Age: 56 y.o. MRN: [id number]  CC:  Chief Complaint  Patient presents with   Ear Pain    Left ear pain with some sinus pressure, little cough symptoms x 1 week.     HPI Kendy Haston presents to establish care Status post 7-day history of URI signs and symptoms patient has developed facial pressure, teeth discomfort left ear congestion postnasal drip and purulent rhinorrhea.  He has been no fever or chills.  Mild cough.  No history of bruxism.  Mild wheezing in the chest without tightness.  Distant history of asthma as a child.  She does not smoke.  Outpatient Encounter Medications as of 07/07/2022  Medication Sig   amoxicillin (AMOXIL) 875 MG tablet Take 1 tablet (875 mg total) by mouth 2 (two) times daily for 10 days.   atorvastatin (LIPITOR) 10 MG tablet Take 1 tablet (10 mg total) by mouth daily.   Blood Glucose Monitoring Suppl (ONETOUCH VERIO FLEX SYSTEM) w/Device KIT 1 each by Does not apply route 3 (three) times daily. E11.9   buPROPion (WELLBUTRIN XL) 150 MG 24 hr tablet TAKE 1 TABLET BY MOUTH  TWICE DAILY   Continuous Blood Gluc Sensor (FREESTYLE LIBRE 2 SENSOR) MISC APPLY 1 SENSOR TOPICALLY EVERY 14 DAYS   cyclobenzaprine (FLEXERIL) 5 MG tablet Take 1 tablet (5 mg total) by mouth at bedtime.   FARXIGA 10 MG TABS tablet TAKE 1 TABLET BY MOUTH ONCE DAILY BEFORE BREAKFAST   fenofibrate (TRICOR) 145 MG tablet TAKE 1 TABLET BY MOUTH  DAILY   gabapentin (NEURONTIN) 300 MG capsule TAKE 1 CAPSULE BY MOUTH AT  BEDTIME   glucose blood (ONETOUCH VERIO) test strip 1 each by Other route 3 (three) times daily. E11.9   ibuprofen (IBU) 800 MG tablet Take 1 tablet (800 mg total) by mouth every 8 (eight) hours as needed. Hold until course of prednisone is completed.   insulin glargine, 2 Unit Dial, (TOUJEO MAX SOLOSTAR) 300 UNIT/ML Solostar Pen INJECT SUBCUTANEOUSLY 120  UNITS DAILY   insulin regular  human CONCENTRATED (HUMULIN R U-500 KWIKPEN) 500 UNIT/ML KwikPen INJECT SUBCUTANEOUSLY 60  UNITS 3 TIMES DAILY 1/2  HOUR BEFORE EACH MEAL   levonorgestrel (MIRENA) 20 MCG/24HR IUD 1 each by Intrauterine route once.   MOUNJARO 5 MG/0.5ML Pen Inject into the skin.   Multiple Vitamins-Minerals (MULTIVITAMIN ADULT PO) Take 1 tablet by mouth daily.    OneTouch Delica Lancets 76K MISC 1 each by Does not apply route 3 (three) times daily. E11.9   pantoprazole (PROTONIX) 40 MG tablet Take 1 tablet (40 mg total) by mouth 2 (two) times daily.   predniSONE (DELTASONE) 20 MG tablet Take 1 tablet (20 mg total) by mouth 2 (two) times daily with a meal for 7 days.   [DISCONTINUED] ibuprofen (ADVIL) 800 MG tablet Take 1 tablet (800 mg total) by mouth every 8 (eight) hours as needed.   hydrOXYzine (ATARAX/VISTARIL) 10 MG tablet Take 1 tablet (10 mg total) by mouth at bedtime as needed for anxiety. (Patient not taking: Reported on 07/07/2022)   metFORMIN (GLUCOPHAGE-XR) 500 MG 24 hr tablet TAKE 4 TABLETS BY MOUTH  ONCE DAILY WITH SUPPER (Patient not taking: Reported on 07/07/2022)   Semaglutide, 2 MG/DOSE, (OZEMPIC, 2 MG/DOSE,) 8 MG/3ML SOPN INJECT SUBCUTANEOUSLY 2 MG EVERY WEEK (Patient not taking: Reported on 07/07/2022)   [DISCONTINUED] predniSONE (DELTASONE) 20 MG tablet Take 2 tablets (40  mg total) by mouth daily with breakfast. (Patient not taking: Reported on 07/07/2022)   No facility-administered encounter medications on file as of 07/07/2022.    Past Medical History:  Diagnosis Date   Back pain    Diabetes mellitus     History reviewed. No pertinent surgical history.  Family History  Problem Relation Age of Onset   Diabetes Father    Diabetes Mother    Fibromyalgia Mother    Heart disease Maternal Grandmother    Gout Maternal Grandmother    Heart attack Maternal Grandfather     Social History   Socioeconomic History   Marital status: Single    Spouse name: Not on file   Number of  children: 3   Years of education: 12   Highest education level: Not on file  Occupational History   Occupation: Geophysicist/field seismologist  Tobacco Use   Smoking status: Former   Smokeless tobacco: Never  Scientific laboratory technician Use: Never used  Substance and Sexual Activity   Alcohol use: No   Drug use: No   Sexual activity: Yes    Birth control/protection: I.U.D.  Other Topics Concern   Not on file  Social History Narrative   Fun/Hobby: Go to church.    Denies abuse and feels safe at home.    Social Determinants of Health   Financial Resource Strain: Not on file  Food Insecurity: Not on file  Transportation Needs: Not on file  Physical Activity: Not on file  Stress: Not on file  Social Connections: Not on file  Intimate Partner Violence: Not on file    Review of Systems  Constitutional: Negative.  Negative for fever and malaise/fatigue.  HENT:  Positive for congestion, ear pain, hearing loss and sinus pain.   Eyes:  Negative for blurred vision, discharge and redness.  Respiratory:  Positive for cough, sputum production and wheezing. Negative for shortness of breath.   Cardiovascular: Negative.   Gastrointestinal:  Negative for abdominal pain.  Genitourinary: Negative.   Musculoskeletal: Negative.  Negative for myalgias.  Skin:  Negative for rash.  Neurological:  Negative for tingling, loss of consciousness and weakness.  Endo/Heme/Allergies:  Negative for polydipsia.        Objective    BP 136/70 (BP Location: Left Arm, Patient Position: Sitting, Cuff Size: Large)   Pulse 87   Temp (!) 97.5 F (36.4 C) (Temporal)   Ht _0  (1.753 m)   Wt 270 lb (122.5 kg)   SpO2 97%   BMI 39.87 kg/m   Physical Exam Constitutional:      General: She is not in acute distress.    Appearance: Normal appearance. She is not ill-appearing, toxic-appearing or diaphoretic.  HENT:     Head: Normocephalic and atraumatic.      Right Ear: External ear normal. No middle ear effusion. Tympanic  membrane is retracted. Tympanic membrane is not erythematous.     Left Ear: External ear normal.  No middle ear effusion. Tympanic membrane is retracted. Tympanic membrane is not erythematous.     Mouth/Throat:     Mouth: Mucous membranes are moist.     Pharynx: Oropharynx is clear. No oropharyngeal exudate or posterior oropharyngeal erythema.  Eyes:     General: No scleral icterus.       Right eye: No discharge.        Left eye: No discharge.     Extraocular Movements: Extraocular movements intact.     Conjunctiva/sclera: Conjunctivae normal.  Pupils: Pupils are equal, round, and reactive to light.  Cardiovascular:     Rate and Rhythm: Normal rate and regular rhythm.  Pulmonary:     Effort: Pulmonary effort is normal. No respiratory distress.     Breath sounds: Normal breath sounds.  Abdominal:     General: Bowel sounds are normal.     Tenderness: There is no abdominal tenderness. There is no guarding.  Musculoskeletal:     Cervical back: No rigidity or tenderness.  Skin:    General: Skin is warm and dry.  Neurological:     Mental Status: She is alert and oriented to person, place, and time.  Psychiatric:        Mood and Affect: Mood normal.        Behavior: Behavior normal.         Assessment & Plan:   Problem List Items Addressed This Visit       Respiratory   Acute non-recurrent maxillary sinusitis - Primary   Relevant Medications   amoxicillin (AMOXIL) 875 MG tablet   predniSONE (DELTASONE) 20 MG tablet   ibuprofen (IBU) 800 MG tablet     Nervous and Auditory   Dysfunction of both eustachian tubes   Relevant Medications   amoxicillin (AMOXIL) 875 MG tablet   predniSONE (DELTASONE) 20 MG tablet    Return if symptoms worsen or fail to improve.   Libby Maw, MD

## 2022-07-27 ENCOUNTER — Other Ambulatory Visit: Payer: Self-pay | Admitting: Endocrinology

## 2022-09-28 ENCOUNTER — Other Ambulatory Visit: Payer: Self-pay | Admitting: Endocrinology

## 2022-10-04 ENCOUNTER — Ambulatory Visit: Payer: Commercial Managed Care - PPO | Admitting: Internal Medicine

## 2022-10-04 VITALS — BP 132/78 | HR 89 | Temp 98.3°F | Ht 69.0 in | Wt 274.0 lb

## 2022-10-04 DIAGNOSIS — R21 Rash and other nonspecific skin eruption: Secondary | ICD-10-CM | POA: Diagnosis not present

## 2022-10-04 DIAGNOSIS — E118 Type 2 diabetes mellitus with unspecified complications: Secondary | ICD-10-CM | POA: Diagnosis not present

## 2022-10-04 MED ORDER — CLOTRIMAZOLE-BETAMETHASONE 1-0.05 % EX CREA: 1.0000 | TOPICAL_CREAM | Freq: Every day | CUTANEOUS | 1 refills | Status: DC

## 2022-10-04 MED ORDER — LINACLOTIDE 145 MCG PO CAPS: 145.0000 ug | ORAL_CAPSULE | Freq: Every day | ORAL | 5 refills | Status: DC

## 2022-10-04 NOTE — Progress Notes (Unsigned)
Patient ID: Pamela Shannon, female   DOB: [birth year], 57 y.o.   MRN: [id number]        Chief Complaint: follow up perioral rash       HPI:  Pamela Shannon is a 57 y.o. female here with c/o 3-4 days onset worsening red,, swelling, itchy area diffuse about the mouth, without mouth pain, rash or swelling;  has some ruddiness to the cheeks that is chronic and not related it seems; no fever, chills, trauma or drooling at night, no prior hx of same.  No current medications to cause this it seems.  Pt denies chest pain, increased sob or doe, wheezing, orthopnea, PND, increased LE swelling, palpitations, dizziness or syncope.   Pt denies polydipsia, polyuria, or new focal neuro s/s.    Pt denies fever, wt loss, night sweats, loss of appetite, or other constitutional symptoms         Wt Readings from Last 3 Encounters:  10/04/22 274 lb (124.3 kg)  07/07/22 270 lb (122.5 kg)  05/23/22 274 lb (124.3 kg)   BP Readings from Last 3 Encounters:  10/04/22 132/78  07/07/22 136/70  05/23/22 130/78         Past Medical History:  Diagnosis Date   Back pain    Diabetes mellitus    History reviewed. No pertinent surgical history.  reports that she has quit smoking. She has never used smokeless tobacco. She reports that she does not drink alcohol and does not use drugs. family history includes Diabetes in her father and mother; Fibromyalgia in her mother; Gout in her maternal grandmother; Heart attack in her maternal grandfather; Heart disease in her maternal grandmother. Allergies  Allergen Reactions   Neosporin [Bacitracin-Polymyxin B] Rash   Current Outpatient Medications on File Prior to Visit  Medication Sig Dispense Refill   atorvastatin (LIPITOR) 10 MG tablet Take 1 tablet (10 mg total) by mouth daily. 90 tablet 3   Blood Glucose Monitoring Suppl ([place]) w/Device KIT 1 each by Does not apply route 3 (three) times daily. E11.9 1 kit 0   buPROPion (WELLBUTRIN XL) 150 MG 24 hr tablet TAKE  1 TABLET BY MOUTH  TWICE DAILY 180 tablet 3   cyclobenzaprine (FLEXERIL) 5 MG tablet Take 1 tablet (5 mg total) by mouth at bedtime. 30 tablet 1   FARXIGA 10 MG TABS tablet TAKE 1 TABLET BY MOUTH ONCE DAILY BEFORE BREAKFAST 90 tablet 3   fenofibrate (TRICOR) 145 MG tablet TAKE 1 TABLET BY MOUTH  DAILY 90 tablet 3   gabapentin (NEURONTIN) 300 MG capsule TAKE 1 CAPSULE BY MOUTH AT  BEDTIME 90 capsule 3   glucose blood (ONETOUCH VERIO) test strip 1 each by Other route 3 (three) times daily. E11.9 90 each 2   ibuprofen (IBU) 800 MG tablet Take 1 tablet (800 mg total) by mouth every 8 (eight) hours as needed. Hold until course of prednisone is completed. 20 tablet 0   insulin glargine, 2 Unit Dial, (TOUJEO MAX SOLOSTAR) 300 UNIT/ML Solostar Pen INJECT SUBCUTANEOUSLY 120  UNITS DAILY 36 mL 0   insulin regular human CONCENTRATED (HUMULIN R U-500 KWIKPEN) 500 UNIT/ML KwikPen INJECT SUBCUTANEOUSLY 60  UNITS 3 TIMES DAILY 1/2  HOUR BEFORE EACH MEAL 36 mL 3   levonorgestrel (MIRENA) 20 MCG/24HR IUD 1 each by Intrauterine route once.     MOUNJARO 5 MG/0.5ML Pen Inject into the skin.     Multiple Vitamins-Minerals (MULTIVITAMIN ADULT PO) Take 1 tablet by mouth daily.  OneTouch Delica Lancets 99991111 MISC 1 each by Does not apply route 3 (three) times daily. E11.9 90 each 2   pantoprazole (PROTONIX) 40 MG tablet Take 1 tablet (40 mg total) by mouth 2 (two) times daily. 60 tablet 3   Continuous Blood Gluc Sensor (FREESTYLE LIBRE 2 SENSOR) MISC APPLY 1 SENSOR TOPICALLY EVERY 14 DAYS (Patient not taking: Reported on 10/04/2022) 2 each 0   hydrOXYzine (ATARAX/VISTARIL) 10 MG tablet Take 1 tablet (10 mg total) by mouth at bedtime as needed for anxiety. (Patient not taking: Reported on 07/07/2022) 30 tablet 0   metFORMIN (GLUCOPHAGE-XR) 500 MG 24 hr tablet TAKE 4 TABLETS BY MOUTH  ONCE DAILY WITH SUPPER (Patient not taking: Reported on 07/07/2022) 360 tablet 3   No current facility-administered medications on file  prior to visit.        ROS:  All others reviewed and negative.  Objective        PE:  BP 132/78 (BP Location: Left Arm, Patient Position: Sitting, Cuff Size: Large)   Pulse 89   Temp 98.3 F (36.8 C) (Oral)   Ht 5' 9"$  (1.753 m)   Wt 274 lb (124.3 kg)   SpO2 94%   BMI 40.46 kg/m                 Constitutional: Pt appears in NAD               HENT: Head: NCAT.                Right Ear: External ear normal.                 Left Ear: External ear normal.                Eyes: . Pupils are equal, round, and reactive to light. Conjunctivae and EOM are normal               Nose: without d/c or deformity; has non discrete diffuse erythema with slight swelling complete perioral               Neck: Neck supple. Gross normal ROM               Cardiovascular: Normal rate and regular rhythm.                 Pulmonary/Chest: Effort normal and breath sounds without rales or wheezing.                              Neurological: Pt is alert. At baseline orientation, motor grossly intact               Skin:  LE edema - none               Psychiatric: Pt behavior is normal without agitation   Micro: none  Cardiac tracings I have personally interpreted today:  none  Pertinent Radiological findings (summarize): none   Lab Results  Component Value Date   WBC 11.2 (H) 12/16/2021   HGB 14.7 12/16/2021   HCT 42.8 12/16/2021   PLT 275.0 12/16/2021   GLUCOSE 222 (H) 12/16/2021   CHOL 185 10/12/2021   TRIG 194.0 (H) 10/12/2021   HDL 37.90 (L) 10/12/2021   LDLDIRECT 96.0 05/24/2021   LDLCALC 109 (H) 10/12/2021   ALT 46 (H) 12/16/2021   AST 50 (H) 12/16/2021   NA 135 12/16/2021   K 4.1 12/16/2021  CL 101 12/16/2021   CREATININE 0.98 12/16/2021   BUN 18 12/16/2021   CO2 26 12/16/2021   TSH 1.93 12/02/2020   HGBA1C 6.9 (H) 10/12/2021   MICROALBUR 5.5 (H) 01/10/2021   Assessment/Plan:  Pamela Shannon is a 58 y.o. American [place] or [place] Native [3] female with  has a past medical history of  Back pain and Diabetes mellitus.  Rash Etiology unclear, appears inflammatory possibly fungal, doubt other infectious, for lotrisone cr prn trial  Type 2 diabetes with complication (HCC) Lab Results  Component Value Date   HGBA1C 6.9 (H) 10/12/2021   Stable, pt to continue current medical treatment farxiga 10 qd, toujeo 12 u qd, regular 60 u tid, mounjaro 5 mg weekly, metformin RE 500 mg - 4 qd  Followup: Return if symptoms worsen or fail to improve.  Cathlean Cower, MD 10/05/2022 8:58 PM [facility]

## 2022-10-04 NOTE — Patient Instructions (Signed)
Please take all new medication as prescribed - the lotrisone cream for the rash around the mouth, and the Linzess as needed  Please continue all other medications as before, and refills have been done if requested.  Please have the pharmacy call with any other refills you may need.  Please keep your appointments with your specialists as you may have planned

## 2022-10-05 ENCOUNTER — Encounter: Payer: Self-pay | Admitting: Internal Medicine

## 2022-10-05 DIAGNOSIS — R21 Rash and other nonspecific skin eruption: Secondary | ICD-10-CM | POA: Insufficient documentation

## 2022-10-05 NOTE — Assessment & Plan Note (Signed)
Etiology unclear, appears inflammatory possibly fungal, doubt other infectious, for lotrisone cr prn trial

## 2022-10-05 NOTE — Assessment & Plan Note (Signed)
Lab Results  Component Value Date   HGBA1C 6.9 (H) 10/12/2021   Stable, pt to continue current medical treatment farxiga 10 qd, toujeo 12 u qd, regular 60 u tid, mounjaro 5 mg weekly, metformin RE 500 mg - 4 qd

## 2023-01-10 ENCOUNTER — Other Ambulatory Visit: Payer: Self-pay | Admitting: Endocrinology

## 2023-01-27 ENCOUNTER — Other Ambulatory Visit: Payer: Self-pay | Admitting: Endocrinology

## 2023-08-29 ENCOUNTER — Ambulatory Visit
Admission: EM | Admit: 2023-08-29 | Discharge: 2023-08-29 | Disposition: A | Discharge status: Home / Self Care | Payer: Commercial Managed Care - PPO

## 2023-08-29 ENCOUNTER — Encounter: Payer: Self-pay | Admitting: Emergency Medicine

## 2023-08-29 ENCOUNTER — Ambulatory Visit: Payer: Self-pay | Admitting: Internal Medicine

## 2023-08-29 DIAGNOSIS — R42 Dizziness and giddiness: Secondary | ICD-10-CM

## 2023-08-29 DIAGNOSIS — R030 Elevated blood-pressure reading, without diagnosis of hypertension: Secondary | ICD-10-CM

## 2023-08-29 DIAGNOSIS — R809 Proteinuria, unspecified: Secondary | ICD-10-CM | POA: Insufficient documentation

## 2023-08-29 MED ORDER — MECLIZINE HCL 25 MG PO TABS: 25.0000 mg | ORAL_TABLET | Freq: Three times a day (TID) | ORAL | 0 refills | Status: DC | PRN

## 2023-08-29 NOTE — ED Provider Notes (Signed)
 [facility]    CSN: [id number] Arrival date & time: 08/29/23  9062      History   Chief Complaint Chief Complaint  Patient presents with   Hypertension    HPI Pamela Shannon is a 58 y.o. female.   Patient here today for evaluation of elevated blood pressure reading she has had at home last 4 days.  She reports the highest has been 192/116.  She typically uses a wrist cuff at home to check her blood pressure.  She notes some intermittent dizziness that was worse yesterday but improved today.  She reports dizziness as the sensation of room spinning.  She has not experienced this before.  She reports some mild headaches when she wakes up but no other concerning symptoms including shortness of breath, lightheadedness, palpitations, chest pain.  She notes her blood sugar has been somewhat elevated but she has not had fever.  She reports feeling much improved today.  The history is provided by the patient.  Hypertension Associated symptoms include headaches. Pertinent negatives include no chest pain, no abdominal pain and no shortness of breath.    Past Medical History:  Diagnosis Date   Back pain    Diabetes mellitus     Patient Active Problem List   Diagnosis Date Noted   Microalbuminuria 08/29/2023   Rash 10/05/2022   Acute non-recurrent maxillary sinusitis 07/07/2022   Dysfunction of both eustachian tubes 07/07/2022   Acute back pain 05/27/2022   Epigastric pain 12/16/2021   Dark stools 12/16/2021   Chronic scapular pain 12/16/2021   Diarrhea 12/16/2021   Numbness and tingling of hand 12/02/2020   Nasal abscess 08/20/2019   Restless leg syndrome 07/13/2015   Hyperlipidemia associated with type 2 diabetes mellitus (HCC) 02/05/2015   Type 2 diabetes with complication (HCC) 12/07/2014   Encounter for general adult medical examination with abnormal findings 07/07/2011   VAGINAL BLEEDING 01/28/2007    History reviewed. No pertinent surgical history.  OB History      Gravida  3   Para  3   Term  3   Preterm      AB      Living         SAB      IAB      Ectopic      Multiple      Live Births               Home Medications    Prior to Admission medications   Medication Sig Start Date End Date Taking? Authorizing Provider  atorvastatin  (LIPITOR) 10 MG tablet Take 1 tablet (10 mg total) by mouth daily. 10/19/21  Yes Von Pacific, MD  Blood Glucose Monitoring Suppl (ONETOUCH VERIO FLEX SYSTEM) w/Device KIT 1 each by Does not apply route 3 (three) times daily. E11.9 01/29/20  Yes Von Pacific, MD  Blood Glucose Monitoring Suppl KIT by Does not apply route. 08/20/19  Yes [provider]  buPROPion  (WELLBUTRIN  XL) 150 MG 24 hr tablet TAKE 1 TABLET BY MOUTH TWICE  DAILY 01/28/23  Yes Von Pacific, MD  buPROPion  (WELLBUTRIN  XL) 150 MG 24 hr tablet Take 1 tablet by mouth 2 (two) times daily. 03/03/19  Yes [provider]  Continuous Blood Gluc Sensor (FREESTYLE LIBRE 2 SENSOR) MISC APPLY 1 SENSOR TOPICALLY EVERY 14 DAYS 04/03/22  Yes Von Pacific, MD  FARXIGA  10 MG TABS tablet TAKE 1 TABLET BY MOUTH ONCE DAILY BEFORE BREAKFAST 02/07/22  Yes Von Pacific, MD  fenofibrate  (  TRICOR ) 145 MG tablet TAKE 1 TABLET BY MOUTH  DAILY 07/28/22  Yes Von Pacific, MD  glucose blood (ONETOUCH VERIO) test strip 1 each by Other route 3 (three) times daily. E11.9 11/18/19  Yes Von Pacific, MD  insulin  glargine, 2 Unit Dial, (TOUJEO  MAX SOLOSTAR) 300 UNIT/ML Solostar Pen INJECT SUBCUTANEOUSLY 120  UNITS DAILY 03/06/22  Yes Von Pacific, MD  insulin  regular human CONCENTRATED (HUMULIN  R U-500 KWIKPEN) 500 UNIT/ML KwikPen INJECT SUBCUTANEOUSLY 60  UNITS 3 TIMES DAILY 1/2  HOUR BEFORE EACH MEAL 02/03/22  Yes Von Pacific, MD  levonorgestrel (MIRENA) 20 MCG/24HR IUD 1 each by Intrauterine route once.   Yes [provider]  meclizine  (ANTIVERT ) 25 MG tablet Take 1 tablet (25 mg total) by mouth 3 (three) times daily as needed for dizziness. 08/29/23  Yes  Billy Asberry FALCON, PA-C  MOUNJARO 5 MG/0.5ML Pen Inject into the skin. 05/08/22  Yes [provider]  Multiple Vitamins-Minerals (MULTIVITAMIN ADULT PO) Take 1 tablet by mouth daily.    Yes [provider]  amoxicillin -clavulanate (AUGMENTIN ) 875-125 MG tablet Take 1 tablet by mouth every 12 (twelve) hours. Patient not taking: Reported on 08/29/2023 08/17/19   [provider]  ascorbic acid (VITAMIN C) 1000 MG tablet Take 1,000 mg by mouth 2 (two) times daily. Patient not taking: Reported on 08/29/2023 08/20/19   [provider]  clotrimazole -betamethasone  (LOTRISONE ) cream Apply 1 Application topically daily. Patient not taking: Reported on 08/29/2023 10/04/22   Norleen Lynwood ORN, MD  cyclobenzaprine  (FLEXERIL ) 5 MG tablet Take 1 tablet (5 mg total) by mouth at bedtime. Patient not taking: Reported on 08/29/2023 12/16/21   Rollene Almarie LABOR, MD  gabapentin  (NEURONTIN ) 300 MG capsule TAKE 1 CAPSULE BY MOUTH AT  BEDTIME Patient not taking: Reported on 08/29/2023 09/30/20   Von Pacific, MD  hydrOXYzine  (ATARAX ) 25 MG tablet Take 25 mg by mouth at bedtime. Patient not taking: Reported on 08/29/2023    [provider]  hydrOXYzine  (ATARAX /VISTARIL ) 10 MG tablet Take 1 tablet (10 mg total) by mouth at bedtime as needed for anxiety. Patient not taking: Reported on 07/07/2022 05/26/21   Elnor Lauraine BRAVO, NP  ibuprofen  (IBU) 800 MG tablet Take 1 tablet (800 mg total) by mouth every 8 (eight) hours as needed. Hold until course of prednisone  is completed. 07/07/22   Berneta Elsie Sayre, MD  linaclotide  (LINZESS ) 145 MCG CAPS capsule Take 1 capsule (145 mcg total) by mouth daily before breakfast. Patient not taking: Reported on 08/29/2023 10/04/22   Norleen Lynwood ORN, MD  metFORMIN  (GLUCOPHAGE -XR) 500 MG 24 hr tablet TAKE 4 TABLETS BY MOUTH  ONCE DAILY WITH SUPPER Patient not taking: Reported on 07/07/2022 05/30/21   Von Pacific, MD  mupirocin  cream (BACTROBAN ) 2 % Apply 1 Application  topically 2 (two) times daily. Patient not taking: Reported on 08/29/2023 08/17/19   [provider]  OneTouch Delica Lancets 33G MISC 1 each by Does not apply route 3 (three) times daily. E11.9 Patient not taking: Reported on 08/29/2023 11/18/19   Von Pacific, MD  pantoprazole  (PROTONIX ) 40 MG tablet Take 1 tablet (40 mg total) by mouth 2 (two) times daily. Patient not taking: Reported on 08/29/2023 12/16/21   Rollene Almarie LABOR, MD  tirzepatide (MOUNJARO) 2.5 MG/0.5ML Pen Inject 0.5 mL Subcutaneous once weekly for 84 days Patient not taking: Reported on 08/29/2023 10/24/22   [provider]  tirzepatide CLOYDE) 2.5 MG/0.5ML Pen Inject 2.5 mg into the skin once a week. Patient not taking: Reported on  08/29/2023 10/24/22   [provider]    Family History Family History  Problem Relation Age of Onset   Diabetes Mother    Fibromyalgia Mother    Diabetes Father    Heart disease Maternal Grandmother    Gout Maternal Grandmother    Heart attack Maternal Grandfather     Social History Social History   Tobacco Use   Smoking status: Former    Types: Cigarettes   Smokeless tobacco: Never  Vaping Use   Vaping status: Never Used  Substance Use Topics   Alcohol use: No   Drug use: No     Allergies   Neosporin [bacitracin-polymyxin b]   Review of Systems Review of Systems  Constitutional:  Negative for chills and fever.  HENT:  Negative for congestion and ear pain.   Eyes:  Negative for discharge, redness and visual disturbance.  Respiratory:  Negative for cough, shortness of breath and wheezing.   Cardiovascular:  Negative for chest pain and palpitations.  Gastrointestinal:  Negative for abdominal pain, diarrhea, nausea and vomiting.  Neurological:  Positive for dizziness and headaches. Negative for facial asymmetry, speech difficulty and numbness.     Physical Exam Triage Vital Signs ED Triage Vitals  Encounter Vitals Group     BP 08/29/23 0949 (!)  160/89     Systolic BP Percentile --      Diastolic BP Percentile --      Pulse Rate 08/29/23 0949 99     Resp 08/29/23 0949 18     Temp 08/29/23 0949 98.2 F (36.8 C)     Temp Source 08/29/23 0949 Oral     SpO2 08/29/23 0949 96 %     Weight --      Height --      Head Circumference --      Peak Flow --      Pain Score 08/29/23 0950 0     Pain Loc --      Pain Education --      Exclude from Growth Chart --    Orthostatic VS for the past 24 hrs:  BP- Lying Pulse- Lying BP- Sitting Pulse- Sitting BP- Standing at 0 minutes Pulse- Standing at 0 minutes  08/29/23 0951 135/78 97 160/89 99 (!) 144/97 102    Updated Vital Signs BP (!) 160/89 (BP Location: Left Arm)   Pulse 99   Temp 98.2 F (36.8 C) (Oral)   Resp 18   LMP  (LMP Unknown)   SpO2 96%   Visual Acuity Right Eye Distance:   Left Eye Distance:   Bilateral Distance:    Right Eye Near:   Left Eye Near:    Bilateral Near:     Physical Exam Vitals and nursing note reviewed.  Constitutional:      General: She is not in acute distress.    Appearance: Normal appearance. She is not ill-appearing.  HENT:     Head: Normocephalic and atraumatic.  Eyes:     Extraocular Movements: Extraocular movements intact.     Conjunctiva/sclera: Conjunctivae normal.     Pupils: Pupils are equal, round, and reactive to light.  Cardiovascular:     Rate and Rhythm: Normal rate and regular rhythm.     Heart sounds: Normal heart sounds.  Pulmonary:     Effort: Pulmonary effort is normal. No respiratory distress.     Breath sounds: Normal breath sounds. No wheezing, rhonchi or rales.  Neurological:     General: No focal deficit present.  Mental Status: She is alert.     Coordination: Coordination normal.     Comments: Normal speech, no facial droop, no pronator drift.  Normal finger-nose and heel-to-shin bilaterally  Psychiatric:        Mood and Affect: Mood normal.        Behavior: Behavior normal.        Thought Content:  Thought content normal.      UC Treatments / Results  Labs (all labs ordered are listed, but only abnormal results are displayed) Labs Reviewed - No data to display  EKG   Radiology No results found.  Procedures Procedures (including critical care time)  Medications Ordered in UC Medications - No data to display  Initial Impression / Assessment and Plan / UC Course  I have reviewed the triage vital signs and the nursing notes.  Pertinent labs & imaging results that were available during my care of the patient were reviewed by me and considered in my medical decision making (see chart for details).     Discussed likely vertigo given improvement and mild symptoms.  Most concerning is headache but patient reports this is resolved.  Suspect elevated blood pressures are related to cuff she is using as blood pressure is not elevated in office especially for rest.  Recommended she continue to monitor symptoms and discussed options for emergency room evaluation today versus follow-up with any persistent or worsening symptoms.  Patient would like to be discharged home today but will report to the emergency room should symptoms worsen or return.  Will treat to cover vertigo with meclizine . Final Clinical Impressions(s) / UC Diagnoses   Final diagnoses:  Vertigo  Elevated blood pressure reading   Discharge Instructions   None    ED Prescriptions     Medication Sig Dispense Auth. Provider   meclizine  (ANTIVERT ) 25 MG tablet Take 1 tablet (25 mg total) by mouth 3 (three) times daily as needed for dizziness. 30 tablet Billy Asberry FALCON, PA-C      PDMP not reviewed this encounter.   Billy Asberry FALCON, PA-C 08/29/23 1125

## 2023-08-29 NOTE — ED Triage Notes (Signed)
 Pt reports several days of high BP readings x4 days. 192/116 is highest reported at home - using automatic wrist cough at home. Reports intermittent episodes of dizziness, the worst being on Monday. Reports a headache earlier today but no dizziness. No pounding HR or palpitations noted at any time. Pt is a diabetic and reports blood sugars have been in the 200s over past few days as well.

## 2023-08-29 NOTE — Telephone Encounter (Signed)
 Copied from CRM [id number]. Topic: Clinical - Red Word Triage >> Aug 29, 2023  8:26 AM Laymon HERO wrote: Reason for CRM: Patient is stating that her blood pressure has been high the last few days, dizzy and off balance and a headache. Today it is reading as 172/122. She is not feeling well at all.  Chief Complaint: elevated bp Symptoms: headache, head feels like it is swimming Frequency: constant Pertinent Negatives: Patient denies fever, cp, sob Disposition: [] ED /[x] Urgent Care (no appt availability in office) / [] Appointment(In office/virtual)/ []  [facility]/ [] Home Care/ [] Refused Recommended Disposition /[] [facility]/ []  Follow-up with PCP Additional Notes: patient states bp has been elevating for a while.  States she spoke with mom re: headache and mom states it could be your bp.  States she is using wrist cuff to take.  This am bp is 172/122.  No apt available with pcp until next week.  Instructed to go to uc and to er if becomes worse.  Care advice given, denies questions.  Reason for Disposition  Systolic BP  >= 180 OR Diastolic >= 110  Answer Assessment - Initial Assessment Questions 1. BLOOD PRESSURE: What is the blood pressure? Did you take at least two measurements 5 minutes apart?     172/122 and 173/123 2. ONSET: When did you take your blood pressure?     Just now. 3. HOW: How did you take your blood pressure? (e.g., automatic home BP monitor, visiting nurse)     Automatic wrist cugg 4. HISTORY: Do you have a history of high blood pressure?     no 5. MEDICINES: Are you taking any medicines for blood pressure? Have you missed any doses recently?     denies 6. OTHER SYMPTOMS: Do you have any symptoms? (e.g., blurred vision, chest pain, difficulty breathing, headache, weakness)     Light headed, head been spinning  Protocols used: Blood Pressure - High-A-AH

## 2023-08-29 NOTE — Telephone Encounter (Signed)
 FYI

## 2023-08-29 NOTE — Telephone Encounter (Signed)
 Pt states that she just left urgent care and said that she will call us  back if this continue to happens. She stated that urgent care explained to her that it could be her meter because her BP checked out normal at urgent care. She may be experiencing vertigo.

## 2023-10-23 ENCOUNTER — Ambulatory Visit: Payer: Self-pay

## 2023-10-23 ENCOUNTER — Ambulatory Visit
Admission: EM | Admit: 2023-10-23 | Discharge: 2023-10-23 | Disposition: A | Discharge status: Home / Self Care | Attending: Internal Medicine | Admitting: Internal Medicine

## 2023-10-23 ENCOUNTER — Encounter: Payer: Self-pay | Admitting: Emergency Medicine

## 2023-10-23 DIAGNOSIS — R053 Chronic cough: Secondary | ICD-10-CM

## 2023-10-23 DIAGNOSIS — J069 Acute upper respiratory infection, unspecified: Secondary | ICD-10-CM

## 2023-10-23 MED ORDER — AMOXICILLIN-POT CLAVULANATE 875-125 MG PO TABS: 1.0000 | ORAL_TABLET | Freq: Two times a day (BID) | ORAL | 0 refills | Status: AC

## 2023-10-23 MED ORDER — BENZONATATE 100 MG PO CAPS: 100.0000 mg | ORAL_CAPSULE | Freq: Three times a day (TID) | ORAL | 0 refills | Status: DC | PRN

## 2023-10-23 NOTE — Discharge Instructions (Signed)
 I have prescribed you an antibiotic and cough medication for symptoms.  Follow-up if any symptoms persist or worsen.

## 2023-10-23 NOTE — ED Triage Notes (Signed)
 Pt presents with a cough, headache, SOB, congestions and bodyaches x 3 weeks. Pt states she did get better for a few days but her symptoms returned 2 days ago. She has been taking OTC cold medication for symptoms.

## 2023-10-23 NOTE — ED Provider Notes (Signed)
 [facility]    CSN: [id number] Arrival date & time: 10/23/23  1000      History   Chief Complaint Chief Complaint  Patient presents with   Cough   Headache   Generalized Body Aches   Sore Throat    HPI Pamela Shannon is a 58 y.o. female.   Patient presents with approximately 3-week history of cough and nasal congestion.  Reports that she has developed sinus pressure so is concerned for a sinus infection.  Cough is productive.  Patient is not reporting any shortness of breath.  Reports known sick contacts in the household.  She has taken over-the-counter Mucinex with minimal improvement of symptoms.  Reports history of asthma but has not had any complications in quite some time.   Cough Headache Sore Throat    Past Medical History:  Diagnosis Date   Back pain    Diabetes mellitus     Patient Active Problem List   Diagnosis Date Noted   Microalbuminuria 08/29/2023   Rash 10/05/2022   Acute non-recurrent maxillary sinusitis 07/07/2022   Dysfunction of both eustachian tubes 07/07/2022   Acute back pain 05/27/2022   Epigastric pain 12/16/2021   Dark stools 12/16/2021   Chronic scapular pain 12/16/2021   Diarrhea 12/16/2021   Numbness and tingling of hand 12/02/2020   Nasal abscess 08/20/2019   Restless leg syndrome 07/13/2015   Hyperlipidemia associated with type 2 diabetes mellitus (HCC) 02/05/2015   Type 2 diabetes with complication (HCC) 12/07/2014   Encounter for general adult medical examination with abnormal findings 07/07/2011   VAGINAL BLEEDING 01/28/2007    History reviewed. No pertinent surgical history.  OB History     Gravida  3   Para  3   Term  3   Preterm      AB      Living         SAB      IAB      Ectopic      Multiple      Live Births               Home Medications    Prior to Admission medications   Medication Sig Start Date End Date Taking? Authorizing Provider  amoxicillin-clavulanate (AUGMENTIN)  875-125 MG tablet Take 1 tablet by mouth every 12 (twelve) hours. 10/23/23  Yes Kirsi Hugh, Rolly Salter E, FNP  benzonatate (TESSALON) 100 MG capsule Take 1 capsule (100 mg total) by mouth every 8 (eight) hours as needed for cough. 10/23/23  Yes Kayn Haymore, Rolly Salter E, FNP  ascorbic acid (VITAMIN C) 1000 MG tablet Take 1,000 mg by mouth 2 (two) times daily. Patient not taking: Reported on 08/29/2023 08/20/19   [provider]  atorvastatin (LIPITOR) 10 MG tablet Take 1 tablet (10 mg total) by mouth daily. 10/19/21   Reather Littler, MD  Blood Glucose Monitoring Suppl (ONETOUCH VERIO FLEX SYSTEM) w/Device KIT 1 each by Does not apply route 3 (three) times daily. E11.9 01/29/20   Reather Littler, MD  Blood Glucose Monitoring Suppl KIT by Does not apply route. 08/20/19   [provider]  buPROPion (WELLBUTRIN XL) 150 MG 24 hr tablet TAKE 1 TABLET BY MOUTH TWICE  DAILY 01/28/23   Reather Littler, MD  buPROPion (WELLBUTRIN XL) 150 MG 24 hr tablet Take 1 tablet by mouth 2 (two) times daily. 03/03/19   [provider]  clotrimazole-betamethasone (LOTRISONE) cream Apply 1 Application topically daily. Patient not taking: Reported on 08/29/2023 10/04/22   Jonny Ruiz,  Len Blalock, MD  Continuous Blood Gluc Sensor (FREESTYLE LIBRE 2 SENSOR) MISC APPLY 1 SENSOR TOPICALLY EVERY 14 DAYS 04/03/22   Reather Littler, MD  cyclobenzaprine (FLEXERIL) 5 MG tablet Take 1 tablet (5 mg total) by mouth at bedtime. Patient not taking: Reported on 08/29/2023 12/16/21   Myrlene Broker, MD  FARXIGA 10 MG TABS tablet TAKE 1 TABLET BY MOUTH ONCE DAILY BEFORE BREAKFAST 02/07/22   Reather Littler, MD  fenofibrate (TRICOR) 145 MG tablet TAKE 1 TABLET BY MOUTH  DAILY 07/28/22   Reather Littler, MD  gabapentin (NEURONTIN) 300 MG capsule TAKE 1 CAPSULE BY MOUTH AT  BEDTIME Patient not taking: Reported on 08/29/2023 09/30/20   Reather Littler, MD  glucose blood (ONETOUCH VERIO) test strip 1 each by Other route 3 (three) times daily. E11.9 11/18/19   Reather Littler, MD  hydrOXYzine  (ATARAX) 25 MG tablet Take 25 mg by mouth at bedtime. Patient not taking: Reported on 08/29/2023    [provider]  hydrOXYzine (ATARAX/VISTARIL) 10 MG tablet Take 1 tablet (10 mg total) by mouth at bedtime as needed for anxiety. Patient not taking: Reported on 07/07/2022 05/26/21   Elenore Paddy, NP  ibuprofen (IBU) 800 MG tablet Take 1 tablet (800 mg total) by mouth every 8 (eight) hours as needed. Hold until course of prednisone is completed. 07/07/22   Mliss Sax, MD  insulin glargine, 2 Unit Dial, (TOUJEO MAX SOLOSTAR) 300 UNIT/ML Solostar Pen INJECT SUBCUTANEOUSLY 120  UNITS DAILY 03/06/22   Reather Littler, MD  insulin regular human CONCENTRATED (HUMULIN R U-500 KWIKPEN) 500 UNIT/ML KwikPen INJECT SUBCUTANEOUSLY 60  UNITS 3 TIMES DAILY 1/2  HOUR BEFORE EACH MEAL 02/03/22   Reather Littler, MD  levonorgestrel (MIRENA) 20 MCG/24HR IUD 1 each by Intrauterine route once.    [provider]  linaclotide Karlene Einstein) 145 MCG CAPS capsule Take 1 capsule (145 mcg total) by mouth daily before breakfast. Patient not taking: Reported on 08/29/2023 10/04/22   Corwin Levins, MD  meclizine (ANTIVERT) 25 MG tablet Take 1 tablet (25 mg total) by mouth 3 (three) times daily as needed for dizziness. 08/29/23   Tomi Bamberger, PA-C  metFORMIN (GLUCOPHAGE-XR) 500 MG 24 hr tablet TAKE 4 TABLETS BY MOUTH  ONCE DAILY WITH SUPPER Patient not taking: Reported on 07/07/2022 05/30/21   Reather Littler, MD  [facility] 5 MG/0.5ML Pen Inject into the skin. 05/08/22   [provider]  Multiple Vitamins-Minerals (MULTIVITAMIN ADULT PO) Take 1 tablet by mouth daily.     [provider]  mupirocin cream (BACTROBAN) 2 % Apply 1 Application topically 2 (two) times daily. Patient not taking: Reported on 08/29/2023 08/17/19   [provider]  OneTouch Delica Lancets 33G MISC 1 each by Does not apply route 3 (three) times daily. E11.9 Patient not taking: Reported on 08/29/2023 11/18/19   Reather Littler, MD   pantoprazole (PROTONIX) 40 MG tablet Take 1 tablet (40 mg total) by mouth 2 (two) times daily. Patient not taking: Reported on 08/29/2023 12/16/21   Myrlene Broker, MD  tirzepatide [facility]) 2.5 MG/0.5ML Pen Inject 0.5 mL Subcutaneous once weekly for 84 days Patient not taking: Reported on 08/29/2023 10/24/22   [provider]  tirzepatide Greggory Keen) 2.5 MG/0.5ML Pen Inject 2.5 mg into the skin once a week. Patient not taking: Reported on 08/29/2023 10/24/22   [provider]    Family History Family History  Problem Relation Age of Onset   Diabetes Mother    Fibromyalgia Mother  Diabetes Father    Heart disease Maternal Grandmother    Gout Maternal Grandmother    Heart attack Maternal Grandfather     Social History Social History   Tobacco Use   Smoking status: Former    Types: Cigarettes   Smokeless tobacco: Never  Vaping Use   Vaping status: Never Used  Substance Use Topics   Alcohol use: No   Drug use: No     Allergies   Neosporin [bacitracin-polymyxin b]   Review of Systems Review of Systems Per HPI  Physical Exam Triage Vital Signs ED Triage Vitals  Encounter Vitals Group     BP 10/23/23 1205 (!) 156/80     Systolic BP Percentile --      Diastolic BP Percentile --      Pulse Rate 10/23/23 1205 97     Resp 10/23/23 1205 18     Temp 10/23/23 1205 98.4 F (36.9 C)     Temp Source 10/23/23 1205 Oral     SpO2 10/23/23 1205 95 %     Weight --      Height --      Head Circumference --      Peak Flow --      Pain Score 10/23/23 1204 3     Pain Loc --      Pain Education --      Exclude from Growth Chart --    No data found.  Updated Vital Signs BP (!) 156/80 (BP Location: Left Arm)   Pulse 97   Temp 98.4 F (36.9 C) (Oral)   Resp 18   SpO2 95%   Visual Acuity Right Eye Distance:   Left Eye Distance:   Bilateral Distance:    Right Eye Near:   Left Eye Near:    Bilateral Near:     Physical Exam Constitutional:       General: She is not in acute distress.    Appearance: Normal appearance. She is not toxic-appearing or diaphoretic.  HENT:     Head: Normocephalic and atraumatic.     Right Ear: Tympanic membrane and ear canal normal.     Left Ear: Tympanic membrane and ear canal normal.     Nose: Congestion present.     Mouth/Throat:     Mouth: Mucous membranes are moist.     Pharynx: No posterior oropharyngeal erythema.  Eyes:     Extraocular Movements: Extraocular movements intact.     Conjunctiva/sclera: Conjunctivae normal.     Pupils: Pupils are equal, round, and reactive to light.  Cardiovascular:     Rate and Rhythm: Normal rate and regular rhythm.     Pulses: Normal pulses.     Heart sounds: Normal heart sounds.  Pulmonary:     Effort: Pulmonary effort is normal. No respiratory distress.     Breath sounds: Normal breath sounds. No stridor. No wheezing, rhonchi or rales.  Musculoskeletal:        General: Normal range of motion.     Cervical back: Normal range of motion.  Skin:    General: Skin is warm and dry.  Neurological:     General: No focal deficit present.     Mental Status: She is alert and oriented to person, place, and time. Mental status is at baseline.  Psychiatric:        Mood and Affect: Mood normal.        Behavior: Behavior normal.        Thought Content: Thought content  normal.        Judgment: Judgment normal.      UC Treatments / Results  Labs (all labs ordered are listed, but only abnormal results are displayed) Labs Reviewed - No data to display  EKG   Radiology No results found.  Procedures Procedures (including critical care time)  Medications Ordered in UC Medications - No data to display  Initial Impression / Assessment and Plan / UC Course  I have reviewed the triage vital signs and the nursing notes.  Pertinent labs & imaging results that were available during my care of the patient were reviewed by me and considered in my medical decision  making (see chart for details).     Suspect that symptoms started off as viral but given duration of symptoms there is concern of secondary bacterial infection, most specifically sinus infection given sinus pressure.  Other differential includes viral bronchitis.  Will avoid steroid therapy given patient reports her blood sugars have been on the higher side recently.  Will prescribe Augmentin.  Benzonatate prescribed to take as needed for cough.  Viral testing deferred given duration of symptoms as it would not change treatment.  Chest imaging deferred given no adventitious lung sounds on exam.  Advised strict follow-up precautions.  Patient verbalized understanding and was agreeable with plan. Final Clinical Impressions(s) / UC Diagnoses   Final diagnoses:  Acute upper respiratory infection  Persistent cough     Discharge Instructions      I have prescribed you an antibiotic and cough medication for symptoms.  Follow-up if any symptoms persist or worsen.    ED Prescriptions     Medication Sig Dispense Auth. Provider   benzonatate (TESSALON) 100 MG capsule Take 1 capsule (100 mg total) by mouth every 8 (eight) hours as needed for cough. 21 capsule [place], [place] E, [place]   amoxicillin-clavulanate (AUGMENTIN) 875-125 MG tablet Take 1 tablet by mouth every 12 (twelve) hours. 14 tablet [place], Acie Fredrickson, [place]      PDMP not reviewed this encounter.   Gustavus Bryant, [place] 10/23/23 1319

## 2023-11-07 ENCOUNTER — Encounter: Payer: Self-pay | Admitting: Internal Medicine

## 2023-12-10 ENCOUNTER — Ambulatory Visit: Payer: Self-pay

## 2023-12-10 NOTE — Telephone Encounter (Signed)
 Copied from CRM [id number]. Topic: Clinical - Red Word Triage >> Dec 10, 2023 10:49 AM Alethia Huxley E wrote: [facility] that prompted transfer to Nurse Triage: Rash. Patient states she has a rash on her face that has been there for about 2 weeks. Rash is red, itchy, and inflamed.   Chief Complaint: Rash Symptoms: Rash around mouth Frequency: Constant  Pertinent Negatives: Patient denies fever, pain Disposition: [] ED /[] Urgent Care (no appt availability in office) / [x] Appointment(In office/virtual)/ []  [facility]/ [] Home Care/ [] Refused Recommended Disposition /[]  Mobile Bus/ []  Follow-up with PCP Additional Notes: Patient reports she has had a rash around her mouth for 2 weeks. She states the rash is red and itchy. She denies any pain or fever. Patient has been seen in the past and has tried OTC cream as suggested then, but states it has not improved her symptoms. Appointment made for the patient tomorrow for evaluation.     Reason for Disposition  Localized rash present > 7 days  Answer Assessment - Initial Assessment Questions 1. APPEARANCE of RASH: "Describe the rash."      Red 2. LOCATION: "Where is the rash located?"      Face 3. NUMBER: "How many spots are there?"      No spots, just redness  4. SIZE: "How big are the spots?" (Inches, centimeters or compare to size of a coin)      All around mouth  5. ONSET: "When did the rash start?"      2 weeks 6. ITCHING: "Does the rash itch?" If Yes, ask: "How bad is the itch?"  (Scale 0-10; or none, mild, moderate, severe)     Yes, 4/10 7. PAIN: "Does the rash hurt?" If Yes, ask: "How bad is the pain?"  (Scale 0-10; or none, mild, moderate, severe)    - NONE (0): no pain    - MILD (1-3): doesn't interfere with normal activities     - MODERATE (4-7): interferes with normal activities or awakens from sleep     - SEVERE (8-10): excruciating pain, unable to do any normal activities     0/10 8. OTHER SYMPTOMS: "Do you have  any other symptoms?" (e.g., fever)     No  Protocols used: Rash or Redness - Localized-A-AH

## 2023-12-11 ENCOUNTER — Encounter: Payer: Self-pay | Admitting: Family Medicine

## 2023-12-11 ENCOUNTER — Ambulatory Visit: Admitting: Family Medicine

## 2023-12-11 VITALS — BP 140/80 | HR 84 | Temp 98.7°F | Ht 69.0 in | Wt 275.5 lb

## 2023-12-11 DIAGNOSIS — E118 Type 2 diabetes mellitus with unspecified complications: Secondary | ICD-10-CM | POA: Diagnosis not present

## 2023-12-11 DIAGNOSIS — R21 Rash and other nonspecific skin eruption: Secondary | ICD-10-CM | POA: Diagnosis not present

## 2023-12-11 DIAGNOSIS — K130 Diseases of lips: Secondary | ICD-10-CM

## 2023-12-11 MED ORDER — CLOTRIMAZOLE-BETAMETHASONE 1-0.05 % EX CREA: 1.0000 | TOPICAL_CREAM | Freq: Every day | CUTANEOUS | 1 refills | Status: DC

## 2023-12-11 NOTE — Patient Instructions (Signed)
 I have sent in Lotrisone  cream for you to use to the area around your mouth twice a day for at least a week  May use Aquaphor or Vaseline with Lotrisone  cream  Try to keep up with whether blood sugars are controlled or not and whether the rash is worse during higher blood sugar times or not.  Increased blood sugars can encouraged the growth of fungus on the skin, mucous membranes  Follow-up with me for new or worsening symptoms.

## 2023-12-11 NOTE — Progress Notes (Signed)
 Acute Office Visit  Subjective:     Patient ID: Pamela Shannon, female    DOB: July 31, 1966, 58 y.o.   MRN: [id number]  Chief Complaint  Patient presents with   Acute Visit    Rash, present for about 3 weeks, it is getting little bit better, has been using Aquaphor. Itches occasionally, when it gets dry it is very painful    HPI Patient is in today for evaluation of rash around the mouth for about the last 3 weeks. States that she has been using Aquaphor to the area with mild relief. Reports that the area itches, is painful and burns when the area is dry. States that this has happened in the past, but she did not have to seek medical care. Denies new exposures, denies lip swelling, tongue swelling, mouth itching. Denies discharge from the area. Denies other concerns today. Medical history as outlined below.  ROS Per HPI      Objective:    BP (!) 140/80 (BP Location: Left Arm, Patient Position: Sitting)   Pulse 84   Temp 98.7 F (37.1 C) (Temporal)   Ht 5\' 9"  (1.753 m)   Wt 275 lb 8 oz (125 kg)   SpO2 95%   BMI 40.68 kg/m    Physical Exam Vitals and nursing note reviewed.  Constitutional:      General: She is not in acute distress.    Appearance: Normal appearance. She is normal weight.  HENT:     Head: Normocephalic and atraumatic.     Right Ear: External ear normal.     Left Ear: External ear normal.     Nose: Nose normal.     Mouth/Throat:     Mouth: Mucous membranes are moist.     Pharynx: Oropharynx is clear.      Comments: Area of erythema, mild swelling, skin cracking at outer angles of mouth.  No bleeding, no discharge. Eyes:     Extraocular Movements: Extraocular movements intact.     Pupils: Pupils are equal, round, and reactive to light.  Cardiovascular:     Rate and Rhythm: Normal rate.  Pulmonary:     Effort: Pulmonary effort is normal.  Musculoskeletal:        General: Normal range of motion.     Cervical back: Normal range of motion.      Right lower leg: No edema.     Left lower leg: No edema.  Lymphadenopathy:     Cervical: No cervical adenopathy.  Neurological:     General: No focal deficit present.     Mental Status: She is alert and oriented to person, place, and time.  Psychiatric:        Mood and Affect: Mood normal.        Thought Content: Thought content normal.    No results found for any visits on 12/11/23.      Assessment & Plan:   Type 2 diabetes with complication (HCC) -     Clotrimazole -Betamethasone ; Apply 1 Application topically daily.  Dispense: 30 g; Refill: 1  Rash -     Clotrimazole -Betamethasone ; Apply 1 Application topically daily.  Dispense: 30 g; Refill: 1  Cheilitis -     Clotrimazole -Betamethasone ; Apply 1 Application topically daily.  Dispense: 30 g; Refill: 1  Discussed that this can occur with increased blood sugars, illness.  Recommend keeping up with whether or not this gets better or worse where the blood sugars are better controlled or not   Meds ordered this  encounter  Medications   clotrimazole -betamethasone  (LOTRISONE ) cream    Sig: Apply 1 Application topically daily.    Dispense:  30 g    Refill:  1    Return if symptoms worsen or fail to improve.  Wellington Half, FNP

## 2024-02-08 ENCOUNTER — Other Ambulatory Visit: Payer: Self-pay | Admitting: Family Medicine

## 2024-02-08 DIAGNOSIS — K130 Diseases of lips: Secondary | ICD-10-CM

## 2024-02-08 DIAGNOSIS — E118 Type 2 diabetes mellitus with unspecified complications: Secondary | ICD-10-CM

## 2024-02-08 DIAGNOSIS — R21 Rash and other nonspecific skin eruption: Secondary | ICD-10-CM
